# Patient Record
Sex: Male | Born: 1950 | Race: White | Hispanic: No | Smoking: Former smoker
Health system: Southern US, Community
[De-identification: ages and names within clinical notes are randomized; demographics above are authoritative.]

## PROBLEM LIST (undated history)

## (undated) DIAGNOSIS — M545 Low back pain, unspecified: Secondary | ICD-10-CM

## (undated) DIAGNOSIS — R011 Cardiac murmur, unspecified: Secondary | ICD-10-CM

## (undated) DIAGNOSIS — S81819A Laceration without foreign body, unspecified lower leg, initial encounter: Secondary | ICD-10-CM

## (undated) DIAGNOSIS — K219 Gastro-esophageal reflux disease without esophagitis: Secondary | ICD-10-CM

## (undated) DIAGNOSIS — G4700 Insomnia, unspecified: Secondary | ICD-10-CM

## (undated) DIAGNOSIS — I2510 Atherosclerotic heart disease of native coronary artery without angina pectoris: Secondary | ICD-10-CM

## (undated) DIAGNOSIS — M65949 Unspecified synovitis and tenosynovitis, unspecified hand: Secondary | ICD-10-CM

## (undated) DIAGNOSIS — N400 Enlarged prostate without lower urinary tract symptoms: Secondary | ICD-10-CM

## (undated) DIAGNOSIS — J189 Pneumonia, unspecified organism: Secondary | ICD-10-CM

## (undated) DIAGNOSIS — G5600 Carpal tunnel syndrome, unspecified upper limb: Secondary | ICD-10-CM

## (undated) DIAGNOSIS — E7800 Pure hypercholesterolemia, unspecified: Secondary | ICD-10-CM

## (undated) DIAGNOSIS — G4733 Obstructive sleep apnea (adult) (pediatric): Secondary | ICD-10-CM

## (undated) DIAGNOSIS — M659 Synovitis and tenosynovitis, unspecified: Secondary | ICD-10-CM

## (undated) DIAGNOSIS — M1990 Unspecified osteoarthritis, unspecified site: Secondary | ICD-10-CM

## (undated) HISTORY — DX: Synovitis and tenosynovitis, unspecified: M65.9

## (undated) HISTORY — DX: Unspecified synovitis and tenosynovitis, unspecified hand: M65.949

## (undated) HISTORY — DX: Gastro-esophageal reflux disease without esophagitis: K21.9

## (undated) HISTORY — DX: Low back pain, unspecified: M54.50

## (undated) HISTORY — DX: Pure hypercholesterolemia, unspecified: E78.00

## (undated) HISTORY — DX: Atherosclerotic heart disease of native coronary artery without angina pectoris: I25.10

## (undated) HISTORY — PX: CORONARY ANGIOPLASTY: SHX604

## (undated) HISTORY — DX: Low back pain: M54.5

## (undated) HISTORY — DX: Insomnia, unspecified: G47.00

## (undated) HISTORY — DX: Laceration without foreign body, unspecified lower leg, initial encounter: S81.819A

## (undated) HISTORY — PX: ROTATOR CUFF REPAIR: SHX139

## (undated) HISTORY — DX: Obstructive sleep apnea (adult) (pediatric): G47.33

## (undated) HISTORY — DX: Benign prostatic hyperplasia without lower urinary tract symptoms: N40.0

## (undated) HISTORY — DX: Carpal tunnel syndrome, unspecified upper limb: G56.00

---

## 2003-09-29 ENCOUNTER — Encounter: Admission: RE | Admit: 2003-09-29 | Discharge: 2003-12-07 | Payer: Self-pay | Admitting: Neurosurgery

## 2006-06-13 ENCOUNTER — Inpatient Hospital Stay (HOSPITAL_COMMUNITY): Admission: RE | Admit: 2006-06-13 | Discharge: 2006-06-14 | Payer: Self-pay | Admitting: Cardiology

## 2006-06-19 ENCOUNTER — Encounter (HOSPITAL_COMMUNITY): Admission: RE | Admit: 2006-06-19 | Discharge: 2006-09-17 | Payer: Self-pay | Admitting: Cardiology

## 2006-09-18 ENCOUNTER — Encounter (HOSPITAL_COMMUNITY): Admission: RE | Admit: 2006-09-18 | Discharge: 2006-10-06 | Payer: Self-pay | Admitting: Cardiology

## 2007-03-19 ENCOUNTER — Ambulatory Visit (HOSPITAL_COMMUNITY): Admission: RE | Admit: 2007-03-19 | Discharge: 2007-03-19 | Payer: Self-pay | Admitting: Cardiology

## 2007-05-02 ENCOUNTER — Emergency Department (HOSPITAL_COMMUNITY): Admission: EM | Admit: 2007-05-02 | Discharge: 2007-05-02 | Payer: Self-pay | Admitting: Emergency Medicine

## 2010-08-14 NOTE — Cardiovascular Report (Signed)
NAME:  Corey Duran, Corey Duran               ACCOUNT NO.:  [id number]   MEDICAL RECORD NO.:  [id number]          PATIENT TYPE:  OIB   LOCATION:  2852                         FACILITY:  [facility]   PHYSICIAN:  Francisca December, M.D.  DATE OF BIRTH:  [birth year]   DATE OF PROCEDURE:  03/19/2007  DATE OF DISCHARGE:  03/19/2007                            CARDIAC CATHETERIZATION   PROCEDURES PERFORMED:  1. Left heart catheterization.  2. Left ventriculogram.  3. Coronary angiography.   INDICATION:  Mr. Jeremaih Klima is a 60 year old man who is now 9 months  S/P a Xience/Promus drug-eluting stent placed in the left anterior  descending artery for progressive angina and high-grade obstruction.  He  has done well in the subsequent 9 months without any recurrent chest  discomfort.  He underwent a myocardial perfusion study with exercise  approximately 2 weeks ago, which showed normal LV systolic function,  excellent exercise tolerance without angina and normal perfusion.  He is  brought to the catheterization laboratory at this time to confirm the  absence of any in-stent restenosis and also to evaluate for any  progressive disease elsewhere.  He did have a 50% stenosis that was  tubular in the proximal portion of the left circumflex.   PROCEDURE NOTE:  The patient was brought to the cardiac catheterization  laboratory in a fasting state.  The right groin was prepped and draped  in the usual sterile fashion.  Local anesthesia was obtained with the  infiltration of 1% lidocaine.  A 6-French catheter sheath was inserted  percutaneously into the right femoral artery utilizing an anterior  approach over guiding J-wire.  Left heart catheterization and coronary  angiography then proceeded in the standard fashion using 6-French #4  left and right Judkins catheters and a 110-cm pigtail catheter.  A 30-  degree RAO cine left ventriculogram was performed utilizing a power  injector, and 45 mL were injected at 13 mL  per second.  The patient did  receive sublingual nitroglycerin 0.4 mg prior to the initiation of  coronary angiography.  At the completion of the procedure, a right  femoral arteriogram with a 45-degree RAO angulation via the catheter  sheath by hand injection documented adequate anatomy for placement of  the percutaneous closure device Angioseal.  This was subsequently  deployed with good hemostasis and an intact distal pulse.   HEMODYNAMICS:  Systemic arterial pressure was 113/63 with a mean of 118  mmHg.  There was no systolic gradient across the aortic valve.  The left  ventricular end-diastolic pressure was 14 mmHg pre-ventriculogram.   ANGIOGRAPHY:  The left ventriculogram demonstrated normal chamber size  and normal global systolic function without regional wall motion  abnormality.  A visual estimate of the ejection fraction is 60% to 75%.  There is no mitral regurgitation.  There is proximal left coronary  calcification seen.  The aortic valve is trileaflet and opens normally  during systole.   There is a right-dominant coronary system present.  The main left  coronary artery is short and normal.   The left anterior descending artery and its branches are  without  significant obstruction.  There is a 40% tubular narrowing from the  ostium to the origin of the stented segment.  The stent itself is widely  patent without any evidence of in-stent restenosis.  Just distal to the  stented segment 2 diagonal branches arise.  The first is small.  The  second is moderate in size, and there is a 70% narrowing at the origin  of the second diagonal branch.  The ongoing anterior descending artery  is without obstruction and reaches as well as traverses the apex.   The left circumflex coronary and its branches are moderately diseased;  the proximal portion from the ostium and extending about 8 to 10 mm  contains a tubular 50% to 60% stenosis.  Previously I had felt this  obstruction to  be 50%.  I cannot say that it significantly progressed at  all.  The circumflex then gives rise to a small first marginal branch, a  moderate-sized second marginal branch, a small third marginal branch and  then a large true obtuse marginal branch.  There are no obstructions in  these distal vessels.  There are luminal irregularities in the main  trunk of the circumflex coronary.   The right coronary artery and its branches are widely patent.  The  vessel is diseased and there are luminal irregularities throughout its  course.  No significant obstruction is seen.  Distally it bifurcates  into a moderate-sized posterior descending artery and a moderate-sized  posterolateral segment that gives rise to 2 small left ventricular  branches.  The posterior descending artery and posterolateral segment  and branches contain no obstructions as well.   Collateral vessels are not seen.   FINAL IMPRESSION:  1. Atherosclerotic coronary vascular disease, two-vessel.  2. Widely patent stented segment in the proximal anterior descending      artery, drug-eluting.  3. Intact left ventricular size and global systolic function, ejection      fraction 65%.  4. No progression of left circumflex coronary disease since March      2008.      Francisca December, M.D.  Electronically Signed     JHE/MEDQ  D:  03/19/2007  T:  03/20/2007  Job:  [id number]   cc:   Theressa Millard, M.D.  [facility]

## 2010-08-17 NOTE — Cardiovascular Report (Signed)
NAME:  Maulding, Egidio               ACCOUNT NO.:  [id number]   MEDICAL RECORD NO.:  [id number]          PATIENT TYPE:  OIB   LOCATION:  2899                         FACILITY:  [facility]   PHYSICIAN:  Francisca December, M.D.  DATE OF BIRTH:  06/20/1950   DATE OF PROCEDURE:  06/13/2006  DATE OF DISCHARGE:                            CARDIAC CATHETERIZATION   PROCEDURES PERFORMED:  1. Left heart catheterization.  2. Coronary angiography.  3. Left ventriculogram.  4. PCI/drug-eluting stent implantation proximal anterior descending      artery.   INDICATIONS:  Mr. Jaxton Casale is a 60 year old man who has developed a  rather typical anginal syndrome.  It had been somewhat progressive.  He  underwent a myocardial perfusion study in the office which was  electrocardiographically and clinically positive.  As well, there was a  large perfusion deficit extending from the base to the apex in the  anterior wall and anteroseptum.  He is brought now to the  catheterization laboratory to identify the extent of disease and provide  for further therapeutic options.   PROCEDURE NOTE:  The patient is brought to the cardiac catheterization  laboratory in the fasting state.  The right groin was prepped and draped  in the usual sterile fashion.  Local anesthesia was obtained with  infiltration of 1% lidocaine.  A 5-French catheter sheath was inserted  percutaneously into the right femoral artery utilizing an anterior  approach over a guiding J-wire.  Left heart catheterization and coronary  angiography, then proceeded in a standard fashion using 5-French #4 left  and right Judkins catheters and a 110-cm pigtail catheter.  A 30 degrees  RAO cine left ventriculogram was performed utilizing a power injector.  Following the completion of the diagnostic portion of the  catheterization, I proceeded with percutaneous intervention.   The patient received 4200 units of heparin intravenously as well as a  double  bolus and constant infusion Integrilin.  The resultant ACT was  275 seconds.  The 5-French catheter sheath was and upsized to a 6-French  catheter sheath over a long guiding J-wire.  A 6-French #3.5 ACLS  guiding catheter was advanced the ascending aorta where the left  coronary os was engaged.  A 0.014 inch Luge intracoronary guidewire was  passed across the lesion without difficulty.  Initial balloon dilatation  was performed with a 3/20 mm Maverick across the lesion in the very  proximal portion of the anterior descending artery.  It was inflated to  4 atmospheres for not greater than 45 seconds.  This balloon was  deflated and removed and a 3.5/16 mm drug-eluting stent (PERSEUS study)  was advanced into place, carefully positioned and inflated to peak  pressure of 15 atmospheres for not greater than 45 seconds.  This  balloon was deflated and removed and following confirmation of adequate  patency in orthogonal views both with and without the guidewire in  place, the guiding catheter was removed.  A right femoral arteriogram in  the 45 degree RAO angulation documented adequate anatomy for placement  of percutaneous closure device Angio-Seal.  This was subsequent deployed  with good hemostasis and an intact distal pulse.   HEMODYNAMICS:  The systemic arterial pressure was 95/55 with mean of 72  mmHg.  There is no systolic gradient across the aortic valve.  Left  ventricular end-diastolic pressure was 13 mmHg preventriculogram.   Angiography:  The left ventriculogram demonstrated normal chamber size  and normal global systolic function without regional wall motion  abnormality.  A visual estimate of the ejection fraction is 65%.  There  is no significant mitral regurgitation.  I cannot identify coronary  calcification.  The aortic valve is trileaflet and opens normally during  systole.   There is a right-dominant coronary system present.  The main left  coronary artery is short and  normal.   The left anterior descending artery and its branches are highly  diseased; there is any eccentric tubular stenosis in the proximal  segment.  This is prior to the origin of two diagonal branches, one of  which is small and the second of which is moderate in size.  The ongoing  anterior descending artery then reaches and barely traverses the apex.   The left circumflex coronary and its branches are moderately diseased;  there is a diffuse proximal stenosis in the range of 50%.  It extends  from the origin of the left circumflex to the origin of the second  marginal branch which is moderate in size.  Beyond this, the left  circumflex and widens significantly and gives rise to a third small  marginal and then a very large fourth or obtuse marginal.  There are  luminal irregularities in the left circumflex and obtuse marginal beyond  the second marginal branch but no significant obstructions.   The right coronary and its branches are without significant obstruction.  Again, there are luminal irregularities and there is a 20-30% stenosis  in the distal segment.  The distal vessel gives rise to a large  posterior descending artery and a fairly large posterolateral segment  with three small left ventricular branches.  There are no significant  obstructions in the distal portion of the right coronary or in its  distal branches.   Following balloon dilatation and stent implantation in the proximal  anterior descending artery, there was no residual stenosis and in fact,  there is a good step-up and step-down at the proximal and distal end  of the stent, respectively.   FINAL IMPRESSION:  1. Atherosclerotic cardiovascular disease, two-vessel.  2. Intact left ventricular size and global systolic function, ejection      fraction 65%.  3. Status post successful percutaneous coronary intervention/drug-     eluting stent implantation proximal anterior descending artery.      Francisca December, M.D.  Electronically Signed     JHE/MEDQ  D:  06/13/2006  T:  06/15/2006  Job:  [id number]   cc:   Theressa Millard, M.D.

## 2013-04-05 ENCOUNTER — Telehealth: Payer: Self-pay | Admitting: Interventional Cardiology

## 2013-04-05 NOTE — Telephone Encounter (Signed)
Received request from Nurse fax box, documents faxed for surgical clearance. To: Maudie Flakes Fax number: [phone] Attention: 04/05/13/km

## 2013-11-02 ENCOUNTER — Encounter: Payer: Self-pay | Admitting: Cardiology

## 2013-11-02 DIAGNOSIS — I2510 Atherosclerotic heart disease of native coronary artery without angina pectoris: Secondary | ICD-10-CM | POA: Insufficient documentation

## 2013-11-02 DIAGNOSIS — E7800 Pure hypercholesterolemia, unspecified: Secondary | ICD-10-CM | POA: Insufficient documentation

## 2013-11-05 ENCOUNTER — Ambulatory Visit: Payer: Self-pay | Admitting: Interventional Cardiology

## 2013-12-22 ENCOUNTER — Ambulatory Visit (INDEPENDENT_AMBULATORY_CARE_PROVIDER_SITE_OTHER): Payer: BC Managed Care – PPO | Admitting: Interventional Cardiology

## 2013-12-22 ENCOUNTER — Encounter: Payer: Self-pay | Admitting: Interventional Cardiology

## 2013-12-22 VITALS — BP 120/78 | HR 52 | Ht 66.0 in | Wt 192.8 lb

## 2013-12-22 DIAGNOSIS — E7800 Pure hypercholesterolemia, unspecified: Secondary | ICD-10-CM

## 2013-12-22 DIAGNOSIS — I10 Essential (primary) hypertension: Secondary | ICD-10-CM

## 2013-12-22 DIAGNOSIS — G4733 Obstructive sleep apnea (adult) (pediatric): Secondary | ICD-10-CM

## 2013-12-22 DIAGNOSIS — I2510 Atherosclerotic heart disease of native coronary artery without angina pectoris: Secondary | ICD-10-CM

## 2013-12-22 MED ORDER — EZETIMIBE-SIMVASTATIN 10-80 MG PO TABS: 0.5000 | ORAL_TABLET | Freq: Every day | ORAL | Status: DC

## 2013-12-22 NOTE — Progress Notes (Signed)
Patient ID: Corey Duran, male   DOB: 11/17/1950, 63 y.o.   MRN: [id number]    [address], [place], [place]  [address] Phone: [phone] Fax:  [phone]  Date:  12/22/2013   ID:  Corey Duran, DOB [birth year], MRN [id number]  PCP:  Horton Finer, MD      History of Present Illness: Corey Duran is a 63 y.o. male  who has CAD, LAD stent in 2008. He had been on CPAP and had some improvement in his fatigue. He has stopped his CPAP of late due to frustration. He had GERD is still.  Not asociated with exercise. Resolved with ranitidine. He does some strenuous work around the house. No chest pain with this. No SOB. CAD/ASCVD:  Denies : Chest pain.  Dizziness.  Leg edema.  Nitroglycerin.  Palpitations.  Syncope.  Not exercising regularly.   Feels he did sleep better with CPAP.       Wt Readings from Last 3 Encounters:  12/22/13 192 lb 12.8 oz (87.454 kg)     Past Medical History  Diagnosis Date  . Hypercholesteremia   . GERD (gastroesophageal reflux disease)   . CTS (carpal tunnel syndrome)     1993, 2002  . Insomnia   . LBP (low back pain)   . Coronary atherosclerosis of native coronary artery     05/2006, stent LAD, patent 03/2007, study stent  . BPH (benign prostatic hypertrophy)     followed by Dr. Reece Agar in the past; Now Dr. Diona Fanti  . Laceration of leg     severe, 2009  . OSA (obstructive sleep apnea)     PSG 07/23/10 ESS 11, AHI 16/hr REM 18/hr, RDI 17/hr REM 20/hr, O2 min 87%; CPAP 08/22/10 CPAP 10 with AHI 0  . Tenosynovitis of finger     left third finger    Current Outpatient Prescriptions  Medication Sig Dispense Refill  . aspirin 81 MG tablet Take 81 mg by mouth daily.      . clopidogrel (PLAVIX) 75 MG tablet Take 75 mg by mouth daily.      . Coenzyme Q10 (COQ10) 100 MG CAPS Take 100 mg by mouth daily.      Marland Kitchen ezetimibe-simvastatin (VYTORIN) 10-80 MG per tablet Take 1 tablet by mouth daily.      . finasteride (PROSCAR)  5 MG tablet Take 5 mg by mouth daily.      . metoprolol tartrate (LOPRESSOR) 25 MG tablet Take 25 mg by mouth 2 (two) times daily.      . Multiple Vitamins-Minerals (MULTIVITAMIN PO) Take 1 tablet by mouth daily.      . nitroGLYCERIN (NITROSTAT) 0.4 MG SL tablet Place 0.4 mg under the tongue every 5 (five) minutes as needed for chest pain.      . Omega-3 Fatty Acids (FISH OIL) 1200 MG CAPS Take 1,200 mg by mouth 2 (two) times daily.      . ranitidine (ZANTAC) 75 MG tablet Take 75 mg by mouth 2 (two) times daily.      . tamsulosin (FLOMAX) 0.4 MG CAPS capsule Take 0.4 mg by mouth daily.       No current facility-administered medications for this visit.    Allergies:   No Known Allergies  Social History:  The patient  reports that he has quit smoking. He does not have any smokeless tobacco history on file. He reports that he drinks alcohol.   Family History:  The patient's family history includes  CAD in his father; Dementia in his mother; Hypertension in his father; Ulcers in his father.   ROS:  Please see the history of present illness.  No nausea, vomiting.  No fevers, chills.  No focal weakness.  No dysuria.    All other systems reviewed and negative.   PHYSICAL EXAM: VS:  BP 120/78  Pulse 52  Ht 5\' 6"  (1.676 m)  Wt 192 lb 12.8 oz (87.454 kg)  BMI 31.13 kg/m2 Well nourished, well developed, in no acute distress HEENT: normal Neck: no JVD, no carotid bruits Cardiac:  normal S1, S2; RRR;  Lungs:  clear to auscultation bilaterally, no wheezing, rhonchi or rales Abd: soft, nontender, no hepatomegaly Ext: no edema Skin: warm and dry Neuro:   no focal abnormalities noted  EKG:  Sinus bradycardia; otherwise normal ECG     ASSESSMENT AND PLAN:  Coronary atherosclerosis of native coronary artery  Continue Plavix Tablet, 75 MG, 1 tablet, Orally, Once a day  Stop Aspirin Tablet, 81 MG, 1 tablet, Orally, Once a day Started Nitroglycerin 0.4 mg tablet, 0.4 mg, 1 tablet as directed, SL,  as directed prn chest pain, 1 vial, Refills 6 IMAGING: EKG    Stegall,Amy 11/06/2012 11:40:09 AM > VARANASI,JAY 11/06/2012 12:26:10 PM > sinus bradycardia, no significant ST changes   Notes: No real angina. He had a negative stress test in 2012. Off of CPAP now.  Hiked [place] recently without problems. LDL 80 in 3/14. Not using   2. Pure hypercholesterolemia  Decrease Vytorin Tablet, 10-80 MG, TAKE half TABLET BY MOUTH ONCE A DAY Continue Fish Oil Capsule, 1200 MG, 1 tab, Orally, bid Notes: LDL 80 at last check in 3/14. In 2015: 297 TG; LDL 38.  Decrease Vytorin.  If cholesterol stays low, could switch to lipitor 80 mg daily.   Check lipids in 3 months. Take fish oil daily.    3. Essential hypertension, benign  Continue Metoprolol Tartrate Tablet, 25 MG, as directed, Orally, bid Notes: Controlled. Doxazosin was stopped for a while by Dr. Maxwell Caul. He thinks this may be why diastolic BP is a little high. If additional BP meds needed, would consder ACE-I given CAD.    4. Obstructive sleep apnea  Notes: Resume CPAP. This should help with fatigue.  F/u with Dr. Maxwell Caul.    Preventive Medicine  Adult topics discussed:  Diet: healthy diet, low calorie, low fat.  Exercise: 5 days a week, at least 30 minutes of aerobic exercise.      Signed, Mina Marble, MD, [facility] 12/22/2013 4:26 PM

## 2013-12-22 NOTE — Patient Instructions (Addendum)
Your physician has recommended you make the following change in your medication:   1. Stop Aspirin.   2. Decrease Vytorin 10-80 to 1/2 tablet daily.   Your physician recommends that you return for a FASTING lipid and heaptic on 03/23/14.   Your physician wants you to follow-up in: 1 year with Dr. Irish Lack. You will receive a reminder letter in the mail two months in advance. If you don't receive a letter, please call our office to schedule the follow-up appointment.

## 2014-03-23 ENCOUNTER — Other Ambulatory Visit: Payer: Self-pay

## 2014-03-29 ENCOUNTER — Other Ambulatory Visit (INDEPENDENT_AMBULATORY_CARE_PROVIDER_SITE_OTHER): Payer: Medicare Other | Admitting: *Deleted

## 2014-03-29 DIAGNOSIS — E7800 Pure hypercholesterolemia, unspecified: Secondary | ICD-10-CM

## 2014-03-29 LAB — HEPATIC FUNCTION PANEL
ALT: 24 U/L (ref 0–53)
AST: 18 U/L (ref 0–37)
Albumin: 4.1 g/dL (ref 3.5–5.2)
Alkaline Phosphatase: 53 U/L (ref 39–117)
Bilirubin, Direct: 0 mg/dL (ref 0.0–0.3)
Total Bilirubin: 0.8 mg/dL (ref 0.2–1.2)
Total Protein: 6.9 g/dL (ref 6.0–8.3)

## 2014-03-29 LAB — LDL CHOLESTEROL, DIRECT: LDL DIRECT: 70.4 mg/dL

## 2014-03-29 LAB — LIPID PANEL
CHOL/HDL RATIO: 4
Cholesterol: 141 mg/dL (ref 0–200)
HDL: 34.2 mg/dL — AB (ref >39.00)
NonHDL: 106.8
Triglycerides: 216 mg/dL — ABNORMAL HIGH (ref 0.0–149.0)
VLDL: 43.2 mg/dL — AB (ref 0.0–40.0)

## 2014-07-26 ENCOUNTER — Other Ambulatory Visit: Payer: Self-pay | Admitting: Internal Medicine

## 2014-07-26 DIAGNOSIS — Z139 Encounter for screening, unspecified: Secondary | ICD-10-CM

## 2014-07-27 ENCOUNTER — Ambulatory Visit
Admission: RE | Admit: 2014-07-27 | Discharge: 2014-07-27 | Disposition: A | Discharge status: Home / Self Care | Payer: Self-pay | Source: Ambulatory Visit | Attending: Internal Medicine | Admitting: Internal Medicine

## 2014-07-27 DIAGNOSIS — Z139 Encounter for screening, unspecified: Secondary | ICD-10-CM

## 2015-01-23 ENCOUNTER — Ambulatory Visit (INDEPENDENT_AMBULATORY_CARE_PROVIDER_SITE_OTHER): Payer: Medicare Other | Admitting: Interventional Cardiology

## 2015-01-23 ENCOUNTER — Encounter: Payer: Self-pay | Admitting: Interventional Cardiology

## 2015-01-23 VITALS — BP 110/75 | HR 51 | Ht 66.0 in | Wt 182.0 lb

## 2015-01-23 DIAGNOSIS — I2510 Atherosclerotic heart disease of native coronary artery without angina pectoris: Secondary | ICD-10-CM | POA: Diagnosis not present

## 2015-01-23 DIAGNOSIS — R001 Bradycardia, unspecified: Secondary | ICD-10-CM | POA: Insufficient documentation

## 2015-01-23 DIAGNOSIS — I10 Essential (primary) hypertension: Secondary | ICD-10-CM

## 2015-01-23 DIAGNOSIS — G4733 Obstructive sleep apnea (adult) (pediatric): Secondary | ICD-10-CM

## 2015-01-23 DIAGNOSIS — E7800 Pure hypercholesterolemia, unspecified: Secondary | ICD-10-CM

## 2015-01-23 NOTE — Patient Instructions (Signed)
Medication Instructions:  Same-no changes  Labwork: Lipids and CMET tomorrow. Please do not eat or drink after midnight tonight.  Testing/Procedures: None  Follow-Up: Your physician wants you to follow-up in: 1 year. You will receive a reminder letter in the mail two months in advance. If you don't receive a letter, please call our office to schedule the follow-up appointment.        If you need a refill on your cardiac medications before your next appointment, please call your pharmacy.

## 2015-01-23 NOTE — Progress Notes (Signed)
Patient ID: Corey Duran, male   DOB: 04-Dec-1950, 64 y.o.   MRN: [id number]     Cardiology Office Note   Date:  01/23/2015   ID:  LIBAN GUEDES, DOB [birth year], MRN [id number]  PCP:  Horton Finer, MD    No chief complaint on file. CAD   Wt Readings from Last 3 Encounters:  01/23/15 182 lb (82.555 kg)  12/22/13 192 lb 12.8 oz (87.454 kg)       History of Present Illness: Corey Duran is a 64 y.o. male who has had CAD.  He had an LAD stent in 2008.  He had been on CAP in the past but stopped it due to the inconvenience.  He sleeps better with the CPAP, but it was not worth the hassle.  He plays golf, dances for exercise.  No chest discomfort.    No shortness of breath with activity.  No sx like what he had with stent.  (Sweating, feeling cold)  Aspirin was stopped in the past.    Vytorin was decreased to half tab daily and LDL was still 70.    He has lost some weight recently through diet control.         Past Medical History  Diagnosis Date  . Hypercholesteremia   . GERD (gastroesophageal reflux disease)   . CTS (carpal tunnel syndrome)     1993, 2002  . Insomnia   . LBP (low back pain)   . Coronary atherosclerosis of native coronary artery     05/2006, stent LAD, patent 03/2007, study stent  . BPH (benign prostatic hypertrophy)     followed by Dr. Reece Agar in the past; Now Dr. Diona Fanti  . Laceration of leg     severe, 2009  . OSA (obstructive sleep apnea)     PSG 07/23/10 ESS 11, AHI 16/hr REM 18/hr, RDI 17/hr REM 20/hr, O2 min 87%; CPAP 08/22/10 CPAP 10 with AHI 0  . Tenosynovitis of finger     left third finger    History reviewed. No pertinent past surgical history.   Current Outpatient Prescriptions  Medication Sig Dispense Refill  . clopidogrel (PLAVIX) 75 MG tablet Take 75 mg by mouth daily.    . Coenzyme Q10 (COQ10) 100 MG CAPS Take 100 mg by mouth daily.    Marland Kitchen ezetimibe-simvastatin (VYTORIN) 10-80 MG per tablet Take 0.5 tablets by  mouth daily.    . finasteride (PROSCAR) 5 MG tablet Take 5 mg by mouth daily.    . metoprolol tartrate (LOPRESSOR) 25 MG tablet Take 25 mg by mouth 2 (two) times daily.    . Multiple Vitamins-Minerals (MULTIVITAMIN PO) Take 1 tablet by mouth daily.    . nitroGLYCERIN (NITROSTAT) 0.4 MG SL tablet Place 0.4 mg under the tongue every 5 (five) minutes as needed for chest pain.    . Omega-3 Fatty Acids (FISH OIL) 1200 MG CAPS Take 1,200 mg by mouth 2 (two) times daily.    . ranitidine (ZANTAC) 75 MG tablet Take 75 mg by mouth as needed (HEARTBURN).     . tamsulosin (FLOMAX) 0.4 MG CAPS capsule Take 0.4 mg by mouth daily.     No current facility-administered medications for this visit.    Allergies:   Review of patient's allergies indicates no known allergies.    Social History:  The patient  reports that he has quit smoking. He does not have any smokeless tobacco history on file. He reports that he drinks alcohol.   Family History:  The patient's family history includes CAD in his father; Dementia in his mother; Heart attack in his father; Hypertension in his father; Ulcers in his father. There is no history of Stroke.    ROS:  Please see the history of present illness.   Otherwise, review of systems are positive for intentional weight loss.   All other systems are reviewed and negative.    PHYSICAL EXAM: VS:  BP 110/75 mmHg  Pulse 51  Ht 5\' 6"  (1.676 m)  Wt 182 lb (82.555 kg)  BMI 29.39 kg/m2 , BMI Body mass index is 29.39 kg/(m^2). GEN: Well nourished, well developed, in no acute distress HEENT: normal Neck: no JVD, carotid bruits, or masses Cardiac: RRR; no murmurs, rubs, or gallops,no edema  Respiratory:  clear to auscultation bilaterally, normal work of breathing GI: soft, nontender, nondistended, + BS MS: no deformity or atrophy Skin: warm and dry, no rash Neuro:  Strength and sensation are intact Psych: euthymic mood, full affect   EKG:   The ekg ordered today demonstrates  normal    Recent Labs: 03/29/2014: ALT 24   Lipid Panel    Component Value Date/Time   CHOL 141 03/29/2014 1141   TRIG 216.0* 03/29/2014 1141   HDL 34.20* 03/29/2014 1141   CHOLHDL 4 03/29/2014 1141   VLDL 43.2* 03/29/2014 1141   LDLDIRECT 70.4 03/29/2014 1141     Other studies Reviewed: Additional studies/ records that were reviewed today with results demonstrating: LAD stent placed in 2008 by Dr. Leonia Reeves.   ASSESSMENT AND PLAN:  1. CAD: Had unstable angina type sx in 2008 without MI at time of his stent placement.    2. Hyperlipidemia: Continue current dose of Vytorin. LDL 70. His LDL may have decreased with his lifestyle changes. Will recheck labs. If his LDL is sniffling below 70, could decrease current dose of Vytorin. 3. Hypertension: controlled.continue current medicines. 4. Bradycardia: Unchanged.  No symptoms of bradycardia. 5. Sleep apnea: He has stopped using the C Pap on his own.  I offered for him to see Dr. Claiborne Billings or Dr. Radford Pax. He would be able to discuss with them more convenient see Pap systems. At this point, he will find a new primary care doctor and he can follow-up with them regarding the sleep apnea.   Current medicines are reviewed at length with the patient today.  The patient concerns regarding his medicines were addressed.  The following changes have been made:  No change  Labs/ tests ordered today include:  No orders of the defined types were placed in this encounter.    Recommend 150 minutes/week of aerobic exercise Low fat, low carb, high fiber diet recommended  Disposition:   FU in 1 year   Teresita Madura., MD  01/23/2015 4:20 PM    [facility] [address], [place], [place]  [address] Phone: [phone]; Fax: [phone]

## 2015-01-24 ENCOUNTER — Other Ambulatory Visit (INDEPENDENT_AMBULATORY_CARE_PROVIDER_SITE_OTHER): Payer: Medicare Other | Admitting: *Deleted

## 2015-01-24 DIAGNOSIS — E7800 Pure hypercholesterolemia, unspecified: Secondary | ICD-10-CM

## 2015-01-24 LAB — COMPREHENSIVE METABOLIC PANEL
ALK PHOS: 49 U/L (ref 40–115)
ALT: 20 U/L (ref 9–46)
AST: 18 U/L (ref 10–35)
Albumin: 3.8 g/dL (ref 3.6–5.1)
BUN: 18 mg/dL (ref 7–25)
CO2: 27 mmol/L (ref 20–31)
CREATININE: 0.98 mg/dL (ref 0.70–1.25)
Calcium: 9 mg/dL (ref 8.6–10.3)
Chloride: 104 mmol/L (ref 98–110)
GLUCOSE: 94 mg/dL (ref 65–99)
Potassium: 4 mmol/L (ref 3.5–5.3)
Sodium: 137 mmol/L (ref 135–146)
Total Bilirubin: 0.7 mg/dL (ref 0.2–1.2)
Total Protein: 6.6 g/dL (ref 6.1–8.1)

## 2015-01-24 LAB — LIPID PANEL
Cholesterol: 136 mg/dL (ref 125–200)
HDL: 32 mg/dL — ABNORMAL LOW (ref >=40)
LDL Cholesterol: 72 mg/dL (ref <130)
Total CHOL/HDL Ratio: 4.3 Ratio (ref <=5.0)
Triglycerides: 158 mg/dL — ABNORMAL HIGH (ref <150)
VLDL: 32 mg/dL — ABNORMAL HIGH (ref <30)

## 2015-01-26 ENCOUNTER — Telehealth: Payer: Self-pay | Admitting: Interventional Cardiology

## 2015-01-26 NOTE — Telephone Encounter (Signed)
Follow up;    Pt return our call needs a call back on cell phone for results.

## 2015-02-14 ENCOUNTER — Telehealth: Payer: Self-pay | Admitting: Interventional Cardiology

## 2015-02-14 NOTE — Telephone Encounter (Signed)
**Note De-identified Katherinne Mofield Obfuscation** The pt is advised of his lab results and he verbalized understanding. 

## 2015-02-14 NOTE — Telephone Encounter (Signed)
New message  ° ° °Patient calling for test results.   °

## 2015-02-14 NOTE — Telephone Encounter (Signed)
**Note De-identified Corey Duran Obfuscation** LMTCB

## 2015-04-07 ENCOUNTER — Encounter: Payer: Self-pay | Admitting: Family Medicine

## 2015-04-07 ENCOUNTER — Ambulatory Visit (INDEPENDENT_AMBULATORY_CARE_PROVIDER_SITE_OTHER): Payer: Medicare Other | Admitting: Family Medicine

## 2015-04-07 ENCOUNTER — Ambulatory Visit (INDEPENDENT_AMBULATORY_CARE_PROVIDER_SITE_OTHER): Payer: Medicare Other

## 2015-04-07 VITALS — BP 140/74 | HR 53 | Temp 97.3°F | Ht 66.0 in | Wt 180.6 lb

## 2015-04-07 DIAGNOSIS — M5442 Lumbago with sciatica, left side: Secondary | ICD-10-CM | POA: Diagnosis not present

## 2015-04-07 DIAGNOSIS — Z23 Encounter for immunization: Secondary | ICD-10-CM | POA: Diagnosis not present

## 2015-04-07 DIAGNOSIS — M545 Low back pain, unspecified: Secondary | ICD-10-CM | POA: Insufficient documentation

## 2015-04-07 MED ORDER — GABAPENTIN 100 MG PO CAPS: 100.0000 mg | ORAL_CAPSULE | Freq: Three times a day (TID) | ORAL | Status: DC

## 2015-04-07 MED ORDER — PREDNISONE 20 MG PO TABS: 20.0000 mg | ORAL_TABLET | Freq: Every day | ORAL | Status: DC

## 2015-04-07 NOTE — Patient Instructions (Signed)
Great to meet you!  We will do a complete physical in April as scheduled.   We are pursuing the MRI  Start prednisone tomorrow morning  Start gabapentin tonight

## 2015-04-07 NOTE — Progress Notes (Signed)
HPI  Patient presents today to establish care and discuss back pain.  Patient explains that he's had difficulty with back pain for several years. Since August he's had pretty much constant pain with worsening exacerbations off and on.  He describes left-sided lower back pain that radiates down to his left. Is a sharp shooting pain. He also has left leg weakness and walks with a limp. At work it hurts worse with twisting, walking, and other activity. He has been working with a Geophysicist/field seismologist and a Restaurant manager, fast food for several months now. His chiropractor, who he seen for over 20 years, has recommended that he try prednisone course and pursue an MRI.  He has a history of coronary artery disease with a stent placed in 2008. He was previously on a CPAP but is not using anymore.  He denies any chest pain, dyspnea, palpitations.  He is tolerating all his medications well. He usually gets a physical every April.   PMH: Smoking status noted His past medical, surgical, social, family history reviewed and updated in EMR ROS: Per HPI  Objective: BP 140/74 mmHg  Pulse 53  Temp(Src) 97.3 F (36.3 C) (Oral)  Ht 5\' 6"  (1.676 m)  Wt 180 lb 9.6 oz (81.92 kg)  BMI 29.16 kg/m2 Gen: NAD, alert, cooperative with exam HEENT: NCAT, TMs normal bilaterally, oropharynx clear, nares clear CV: RRR, good S1/S2, no murmur Resp: CTABL, no wheezes, non-labored Abd: SNTND, BS present, no guarding or organomegaly Ext: No edema, warm Neuro: Alert and oriented, 2+ patellar tendon reflexes bilaterally, left lower extremity with 3-4/5 strength compared to 5/5 strength on the right, positive modified straight leg raise on both legs, left greater than right but both with left-sided symptoms. Limited range of motion due to pain twisting to the left, right, and flexing forward.   Low back film 04/07/2015 Preserved joint space except for L5/S1 where he is joint space narrowing and sclerosis, some mild sclerosis  otherwise No acute findings  Assessment and plan:  # Low back pain with sciatica With loss of strength of fingers appropriately go ahead and pursue MRI. Plain film today shows severe degenerative disc disease at as L5/S1 Prednisone, gabapentin Physical therapy order. Continue NSAIDs as needed  # CAD Status post stent placement, continue Plavix Continue beta blocker We'll plan to limit NSAIDs, Aleve is preferable  # BPH Helped by Proscar and Flomax   Plans physical in April this year    Orders Placed This Encounter  Procedures  . DG Lumbar Spine 2-3 Views    Standing Status: Future     Number of Occurrences:      Standing Expiration Date: 06/06/2016    Order Specific Question:  Reason for Exam (SYMPTOM  OR DIAGNOSIS REQUIRED)    Answer:  back pain with scaitica and leg weakness, L side    Order Specific Question:  Preferred imaging location?    Answer:  [facility]  . MR Lumbar Spine Wo Contrast    Standing Status: Future     Number of Occurrences:      Standing Expiration Date: 06/04/2016    Order Specific Question:  Reason for Exam (SYMPTOM  OR DIAGNOSIS REQUIRED)    Answer:  back pain with scaitica and leg weakness, L side    Order Specific Question:  Preferred imaging location?    Answer:  [facility]    Order Specific Question:  Does the patient have a pacemaker or implanted devices?    Answer:  No  Order Specific Question:  What is the patient's sedation requirement?    Answer:  No Sedation    Meds ordered this encounter  Medications  . predniSONE (DELTASONE) 20 MG tablet    Sig: Take 1 tablet (20 mg total) by mouth daily with breakfast.    Dispense:  14 tablet    Refill:  0  . gabapentin (NEURONTIN) 100 MG capsule    Sig: Take 1 capsule (100 mg total) by mouth 3 (three) times daily.    Dispense:  90 capsule    Refill:  [place], MD [facility] 04/07/2015, 3:32 PM

## 2015-04-11 ENCOUNTER — Telehealth: Payer: Self-pay | Admitting: Family Medicine

## 2015-04-11 DIAGNOSIS — M5442 Lumbago with sciatica, left side: Secondary | ICD-10-CM

## 2015-04-11 NOTE — Telephone Encounter (Signed)
Pt aware MRI went to clinical review and PT appointment made

## 2015-04-11 NOTE — Telephone Encounter (Signed)
mRI in review with insurance

## 2015-04-12 ENCOUNTER — Telehealth: Payer: Self-pay | Admitting: Family Medicine

## 2015-04-13 ENCOUNTER — Ambulatory Visit: Payer: Medicare Other | Attending: Family Medicine

## 2015-04-13 DIAGNOSIS — R531 Weakness: Secondary | ICD-10-CM

## 2015-04-13 DIAGNOSIS — M5417 Radiculopathy, lumbosacral region: Secondary | ICD-10-CM | POA: Insufficient documentation

## 2015-04-13 DIAGNOSIS — M5416 Radiculopathy, lumbar region: Secondary | ICD-10-CM

## 2015-04-13 DIAGNOSIS — R262 Difficulty in walking, not elsewhere classified: Secondary | ICD-10-CM | POA: Diagnosis present

## 2015-04-13 DIAGNOSIS — M5432 Sciatica, left side: Secondary | ICD-10-CM | POA: Diagnosis present

## 2015-04-13 MED ORDER — TRAMADOL HCL 50 MG PO TABS: 50.0000 mg | ORAL_TABLET | Freq: Three times a day (TID) | ORAL | Status: DC | PRN

## 2015-04-13 NOTE — Therapy (Signed)
[place], [place], [address] Phone: [phone]   Fax:  [phone]  Physical Therapy Evaluation  Patient Details  Name: BOHANNON COMPHER MRN: [id number] Date of Birth: 1950-11-21 Referring Provider: Wendi Snipes  Encounter Date: 04/13/2015      PT End of Session - 04/13/15 1819    Visit Number 1   Number of Visits 20   Date for PT Re-Evaluation 06/07/15   PT Start Time [id number]   PT Stop Time 1423   PT Time Calculation (min) 50 min   Activity Tolerance Patient limited by pain   Behavior During Therapy [facility] for tasks assessed/performed      Past Medical History  Diagnosis Date  . Hypercholesteremia   . GERD (gastroesophageal reflux disease)   . CTS (carpal tunnel syndrome)     1993, 2002  . Insomnia   . LBP (low back pain)   . Coronary atherosclerosis of native coronary artery     05/2006, stent LAD, patent 03/2007, study stent  . BPH (benign prostatic hypertrophy)     followed by Dr. Reece Agar in the past; Now Dr. Diona Fanti  . Laceration of leg     severe, 2009  . OSA (obstructive sleep apnea)     PSG 07/23/10 ESS 11, AHI 16/hr REM 18/hr, RDI 17/hr REM 20/hr, O2 min 87%; CPAP 08/22/10 CPAP 10 with AHI 0  . Tenosynovitis of finger     left third finger    No past surgical history on file.  There were no vitals filed for this visit.  Visit Diagnosis:  Lumbar back pain with radiculopathy affecting left lower extremity - Plan: PT plan of care cert/re-cert  Sciatica associated with disorder of lumbar spine, left - Plan: PT plan of care cert/re-cert  Weakness - Plan: PT plan of care cert/re-cert  Difficulty in walking - Plan: PT plan of care cert/re-cert      Subjective Assessment - 04/13/15 1338    Subjective Pt rpeorts LBP began 40- yrs ago after "moving a piano". Pt reports his back has always "bothered" him since. Pt sought tx with massage therapist and chiropractor with some relief over the  past few years. On Aug 4th, pt reports exacerbation of LBP after golf swing. Since then, back pain has worsned and 2 weeks ago, referral pain into L leg began and is severely limiting pt's abily to do any functional movements. Pt reports he has been using RW at home. Pt's goals are to dimishish pain and return to PLOF including golfing.    Pertinent History Hx of chronic back pain.    How long can you sit comfortably? 0 mins   How long can you stand comfortably? 0 mins   How long can you walk comfortably? 0 mins    Diagnostic tests MRI was denied - requiring PT first for 6 weeks.    Patient Stated Goals return to golf and pain- free   Currently in Pain? Yes   Pain Score 6    Pain Location Back   Pain Orientation Left;Lower;Mid   Pain Descriptors / Indicators Sharp;Shooting;Aching;Constant   Pain Type Neuropathic pain   Pain Radiating Towards L foot L4 dermatone    Pain Onset 1 to 4 weeks ago  2 weeks ago radiated to foot    Pain Frequency Constant   Aggravating Factors  worse in AM, worse late at night lying in bed. able to sit leaning to L away from pain  longer than stand  Pain Relieving Factors lying supine    Effect of Pain on Daily Activities unable to walk, sit, or perform ADLs without pain.             [facility] PT Assessment - 04/13/15 1802    Assessment   Medical Diagnosis LBP with L sciatica   Referring Provider Wendi Snipes   Onset Date/Surgical Date 03/29/15   Prior Therapy Chiropractor and massage therapist    Precautions   Precautions None   Balance Screen   Has the patient fallen in the past 6 months No   [place] residence   Prior Function   Level of Independence Independent   Observation/Other Assessments   Focus on Therapeutic Outcomes (FOTO)  64% limited    Posture/Postural Control   Posture/Postural Control Postural limitations   Postural Limitations Weight shift right   Posture Comments lateral shift R and flexed   AROM    Lumbar Flexion 50   Lumbar Extension 10   Lumbar - Right Side Bend 0   Lumbar - Left Side Bend 0   Lumbar - Right Rotation 0   Lumbar - Left Rotation 0   Strength   Overall Strength --  deferred strength testing due to pain   Palpation   Palpation comment mm guarding L paraspinals and gluteals   Special Tests    Special Tests --                   [facility] Adult PT Treatment/Exercise - 04/13/15 1802    Exercises   Exercises Lumbar   Lumbar Exercises: Prone   Other Prone Lumbar Exercises prone over 3 pillow 10 mins    Modalities   Modalities Cryotherapy   Cryotherapy   Number Minutes Cryotherapy 8 Minutes   Cryotherapy Location Lumbar Spine   Type of Cryotherapy Ice pack                PT Education - 04/13/15 1818    Education provided Yes   Education Details PT POC, positioning, suspected stenosis due to lateral disc bulge.   Person(s) Educated Patient   Methods Demonstration;Explanation;Verbal cues   Comprehension Verbalized understanding;Returned demonstration;Verbal cues required          PT Short Term Goals - 04/13/15 1829    PT SHORT TERM GOAL #1   Title Pt will be I with initial HEP for continued strengthening and mobility   ( 05/14/15)   Time 4   Period Weeks   Status New   PT SHORT TERM GOAL #2   Title Pain will decrease from 6-7/10 to 3-4/10 at rest by 05/14/15.   Time 4   Period Weeks   Status New           PT Long Term Goals - 04/13/15 1830    PT LONG TERM GOAL #1   Title Pt will be able to demo safe lifting, body mechanics, and understand posture as it pertains to his back by 06/07/15    Time 8   Period Weeks   Status New   PT LONG TERM GOAL #2   Title FOTO score will improve from 64% limited to <29% on FOTO to demo improved function and mobility by 06/07/15.   Time 8   Period Weeks   Status New   PT LONG TERM GOAL #3   Title Lumbar extension AROM will improve from 7 degrees with ERP to 15 degrees pain-free in order to  tolerate standing and  walking in community  200 feet without an increase in pain by 06/07/15.   Time 8   Period Weeks   Status New               Plan - 04/13/15 1823    Clinical Impression Statement Pt presents for low complexity evaluation for lumbar pain with sciatica. Signs and symptoms are compatible with lateral disc bulge with L4 radiculopathy. Pt presents with impairments including pain, impaired mobility/ROM, and impaired strength, which limit functional abilities with gait, standing, sitting, bending, lifting.  Pt will benefit from oupt PT for 3 times a week for 8 weeks for core strengthening, stretching, education on positioning and body mechanics in order to address these impairments and functional limitations and return pt to pain-free PLOF. Limited assessment of strength, ROM, and further testing due to pt's severe pain. Treatment concentrated on positioning and training of HEP to help decrease pain.  Walking 80 feet increased pt's pain to 8/10.    Pt will benefit from skilled therapeutic intervention in order to improve on the following deficits Abnormal gait;Decreased endurance;Decreased range of motion;Difficulty walking;Decreased mobility;Decreased activity tolerance;Decreased strength;Impaired flexibility;Improper body mechanics;Increased muscle spasms   Rehab Potential Fair   Clinical Impairments Affecting Rehab Potential severity of pain and chronic nature of LBP   PT Frequency 3x / week   PT Duration 8 weeks   PT Treatment/Interventions ADLs/Self Care Home Management;Cryotherapy;Electrical Stimulation;Moist Heat;Traction;Therapeutic exercise;Therapeutic activities;Functional mobility training;Stair training;Gait training;Ultrasound;Neuromuscular re-education;Patient/family education;Manual techniques   PT Next Visit Plan Prone over pillows, progression, if able. Manual Traction,    PT Home Exercise Plan Prone over pillows progression, if appropriate   Consulted and Agree  with Plan of Care Patient         Problem List Patient Active Problem List   Diagnosis Date Noted  . Low back pain 04/07/2015  . Bradycardia 01/23/2015  . Essential hypertension, benign 12/22/2013  . OSA (obstructive sleep apnea) 12/22/2013  . Hypercholesteremia   . Coronary atherosclerosis of native coronary artery     Dollene Cleveland, PT 04/13/2015, 6:43 PM  [facility] [facility] [address] [place], [place], [address] Phone: [phone]   Fax:  [phone]  Name: AMOGH BEAUMAN MRN: [id number] Date of Birth: 03-07-1951

## 2015-04-13 NOTE — Patient Instructions (Addendum)
Positioning: Prone over 3 pillows, 10 mins, 3 times a day. Technique for positional changes to avoid increased pain.   Ice: 20 mins, when positioned prone or sitting. 3 times a day

## 2015-04-13 NOTE — Telephone Encounter (Signed)
rx called in

## 2015-04-13 NOTE — Telephone Encounter (Signed)
Called and discussed.   Will send in tramadol.   Prefers Dr. Saintclair Halsted for neurosurgery.   Corey Apple, MD [facility] 04/13/2015, 9:47 AM

## 2015-04-13 NOTE — Addendum Note (Signed)
Addended by: Timmothy Euler on: 04/13/2015 09:49 AM   Modules accepted: Orders

## 2015-04-17 ENCOUNTER — Ambulatory Visit: Payer: Medicare Other

## 2015-04-17 DIAGNOSIS — M5416 Radiculopathy, lumbar region: Secondary | ICD-10-CM

## 2015-04-17 DIAGNOSIS — R531 Weakness: Secondary | ICD-10-CM

## 2015-04-17 DIAGNOSIS — M5432 Sciatica, left side: Secondary | ICD-10-CM

## 2015-04-17 DIAGNOSIS — R262 Difficulty in walking, not elsewhere classified: Secondary | ICD-10-CM

## 2015-04-17 DIAGNOSIS — M5417 Radiculopathy, lumbosacral region: Secondary | ICD-10-CM | POA: Diagnosis not present

## 2015-04-17 NOTE — Therapy (Signed)
[place], [place], [address] Phone: [phone]   Fax:  [phone]  Physical Therapy Treatment  Patient Details  Name: Corey Duran MRN: [id number] Date of Birth: 01/28/1951 Referring Provider: Wendi Snipes  Encounter Date: 04/17/2015      PT End of Session - 04/17/15 0954    Visit Number 2   Number of Visits 20   Date for PT Re-Evaluation 06/07/15   PT Start Time 0850   PT Stop Time 0940   PT Time Calculation (min) 50 min   Activity Tolerance Patient limited by pain   Behavior During Therapy [facility] for tasks assessed/performed      Past Medical History  Diagnosis Date  . Hypercholesteremia   . GERD (gastroesophageal reflux disease)   . CTS (carpal tunnel syndrome)     1993, 2002  . Insomnia   . LBP (low back pain)   . Coronary atherosclerosis of native coronary artery     05/2006, stent LAD, patent 03/2007, study stent  . BPH (benign prostatic hypertrophy)     followed by Dr. Reece Agar in the past; Now Dr. Diona Fanti  . Laceration of leg     severe, 2009  . OSA (obstructive sleep apnea)     PSG 07/23/10 ESS 11, AHI 16/hr REM 18/hr, RDI 17/hr REM 20/hr, O2 min 87%; CPAP 08/22/10 CPAP 10 with AHI 0  . Tenosynovitis of finger     left third finger    No past surgical history on file.  There were no vitals filed for this visit.  Visit Diagnosis:  Lumbar back pain with radiculopathy affecting left lower extremity  Sciatica associated with disorder of lumbar spine, left  Weakness  Difficulty in walking      Subjective Assessment - 04/17/15 0852    Subjective No significant changes after last visit. Pt reports only partial complaince with HEP. Currently pain rates 8 / 10. Pt presents with severe antalgic gait pattern. Pt reports lesser pain in low back after taking prednisone.    Currently in Pain? Yes   Pain Location Back   Pain Orientation Left;Lower;Mid   Pain Descriptors / Indicators  Sharp;Shooting;Aching;Constant   Pain Type Neuropathic pain;Acute pain   Pain Frequency Constant                         [facility] Adult PT Treatment/Exercise - 04/17/15 0949    Self-Care   Self-Care Other Self-Care Comments   Other Self-Care Comments  positioning, HEP, prone over pillows, training with SPC to use for short distances if not using RW.    Exercises   Exercises Lumbar   Lumbar Exercises: Supine   AB Set Limitations instructed pt in core brace before transition supine to sit to stabilize spine.    Other Supine Lumbar Exercises mini LTR -    Lumbar Exercises: Prone   Other Prone Lumbar Exercises prone over pillows x 3 mins, increased pain, so discontinued.    Modalities   Modalities Electrical Stimulation;Moist Heat   Moist Heat Therapy   Number Minutes Moist Heat 20 Minutes   Moist Heat Location Lumbar Spine   Electrical Stimulation   Electrical Stimulation Location Lumbar   Electrical Stimulation Action IFC   Electrical Stimulation Goals Pain   Manual Therapy   Manual Therapy Manual Traction   Manual therapy comments pt supine, manual traction with belt   Manual Traction 10 mins  PT Education - 04/17/15 [phone]    Education provided Yes   Education Details Importance of HEP    Person(s) Educated Patient   Methods Explanation   Comprehension Verbalized understanding          PT Short Term Goals - 04/13/15 1829    PT SHORT TERM GOAL #1   Title Pt will be I with initial HEP for continued strengthening and mobility   ( 05/14/15)   Time 4   Period Weeks   Status New   PT SHORT TERM GOAL #2   Title Pain will decrease from 6-7/10 to 3-4/10 at rest by 05/14/15.   Time 4   Period Weeks   Status New           PT Long Term Goals - 04/13/15 1830    PT LONG TERM GOAL #1   Title Pt will be able to demo safe lifting, body mechanics, and understand posture as it pertains to his back by 06/07/15    Time 8   Period Weeks    Status New   PT LONG TERM GOAL #2   Title FOTO score will improve from 64% limited to <29% on FOTO to demo improved function and mobility by 06/07/15.   Time 8   Period Weeks   Status New   PT LONG TERM GOAL #3   Title Lumbar extension AROM will improve from 7 degrees with ERP to 15 degrees pain-free in order to tolerate standing and walking in community  200 feet without an increase in pain by 06/07/15.   Time 8   Period Weeks   Status New               Plan - 04/17/15 0954    Clinical Impression Statement Pt continues to present in severe pain with no significant changes since last visit. Pt's partial compliance with HEP limits ability to understand how  it is affecting pt. Attempted prone over pillows with increased pain after 3 mins, so repositioned to sitting with HP, which also was intolerable, so transitioned to supine with HP and IFC with good reponse and pain decreased from 8/10 to 5//10 . Instructed pt in gentle LTR with no increase in pain. Performed gentle manual traction wth pt supine and with belt and good relief, per pt report with pain decreased to 3-4/10 following tx. Instructed pt in abdom brace during transitions ( supine to sit) to help stabilize spine and decrease pain.  Instructed pt in use of SPC if unable to use RW to help decrease pain, and pt reports less pain wiht gait, however, pt was unable to walk continuously back to waiting room x 80 feet without stopping to rest and L LE giving out.    PT Next Visit Plan Prone over pillows, progression, if able. Manual Traction/ mechanical traction, if appropriate.    PT Home Exercise Plan LTR gently supine in AM upon waking to help decrease pain and improve mobility.    Consulted and Agree with Plan of Care Patient        Problem List Patient Active Problem List   Diagnosis Date Noted  . Low back pain 04/07/2015  . Bradycardia 01/23/2015  . Essential hypertension, benign 12/22/2013  . OSA (obstructive sleep apnea)  12/22/2013  . Hypercholesteremia   . Coronary atherosclerosis of native coronary artery     Dollene Cleveland, PT 04/17/2015, 10:02 AM  [facility] [address] [place], [place], [address] Phone: [phone]  Fax:  [phone]  Name: Corey Duran MRN: [id number] Date of Birth: 03-06-1951

## 2015-04-19 ENCOUNTER — Ambulatory Visit: Payer: Medicare Other | Admitting: Physical Therapy

## 2015-04-19 DIAGNOSIS — M5416 Radiculopathy, lumbar region: Secondary | ICD-10-CM

## 2015-04-19 DIAGNOSIS — M5417 Radiculopathy, lumbosacral region: Secondary | ICD-10-CM | POA: Diagnosis not present

## 2015-04-19 DIAGNOSIS — R262 Difficulty in walking, not elsewhere classified: Secondary | ICD-10-CM

## 2015-04-19 DIAGNOSIS — M5432 Sciatica, left side: Secondary | ICD-10-CM

## 2015-04-19 NOTE — Patient Instructions (Signed)
OK to use tennis balls for trigger point release.

## 2015-04-19 NOTE — Therapy (Signed)
[place], [place], [address] Phone: [phone]   Fax:  [phone]  Physical Therapy Treatment  Patient Details  Name: Corey Duran MRN: [id number] Date of Birth: 12-11-1950 Referring Provider: Wendi Snipes  Encounter Date: 04/19/2015      PT End of Session - 04/19/15 1335    Visit Number 3   Number of Visits 20   Date for PT Re-Evaluation --   PT Start Time 0849   PT Stop Time 0945   PT Time Calculation (min) 56 min   Activity Tolerance Patient limited by pain   Behavior During Therapy [facility] for tasks assessed/performed      Past Medical History  Diagnosis Date  . Hypercholesteremia   . GERD (gastroesophageal reflux disease)   . CTS (carpal tunnel syndrome)     1993, 2002  . Insomnia   . LBP (low back pain)   . Coronary atherosclerosis of native coronary artery     05/2006, stent LAD, patent 03/2007, study stent  . BPH (benign prostatic hypertrophy)     followed by Dr. Reece Agar in the past; Now Dr. Diona Fanti  . Laceration of leg     severe, 2009  . OSA (obstructive sleep apnea)     PSG 07/23/10 ESS 11, AHI 16/hr REM 18/hr, RDI 17/hr REM 20/hr, O2 min 87%; CPAP 08/22/10 CPAP 10 with AHI 0  . Tenosynovitis of finger     left third finger    No past surgical history on file.  There were no vitals filed for this visit.  Visit Diagnosis:  Lumbar back pain with radiculopathy affecting left lower extremity  Sciatica associated with disorder of lumbar spine, left  Difficulty in walking      Subjective Assessment - 04/19/15 1302    Subjective Manual traction helped, but only while she was pulling.  PT has not helped yet.  Had to use the wheelchair at the mall.   Currently in Pain? Yes   Pain Score 7    Pain Location Back   Pain Orientation Left;Mid;Lower   Pain Descriptors / Indicators Shooting;Sharp;Aching;Constant   Pain Radiating Towards LT foot   Pain Frequency Constant   Aggravating  Factors  worse am, walking   Pain Relieving Factors using walker,  sitting shifted to the side.                           [facility] Adult PT Treatment/Exercise - 04/19/15 0845    Lumbar Exercises: Sidelying   Other Sidelying Lumbar Exercises McKenzie sidelying RT   Also tried standing.  No symptoms changed.     Moist Heat Therapy   Number Minutes Moist Heat 15 Minutes   Moist Heat Location Lumbar Spine  gluteal too LT   Manual Therapy   Manual therapy comments while sidelying([place]) soft tissue work, at times instrument assisted.  Low back, gluteal/piriformis.  tissue softened.  Pain eased briefly with passive hip IR, Flexion/Adduction with strumming to help lengthen piriforimis.                  PT Education - 04/19/15 1334    Education provided Yes   Education Details  model used to educate re: anatomy.   Person(s) Educated Patient   Methods Explanation   Comprehension Verbalized understanding          PT Short Term Goals - 04/19/15 1342    PT SHORT TERM GOAL #1  Title Pt will be I with initial HEP for continued strengthening and mobility   ( 05/14/15)   Time 4   Period Weeks   Status On-going   PT SHORT TERM GOAL #2   Title Pain will decrease from 6-7/10 to 3-4/10 at rest by 05/14/15.   Baseline varies   Time 4   Period Weeks   Status On-going           PT Long Term Goals - 04/13/15 1830    PT LONG TERM GOAL #1   Title Pt will be able to demo safe lifting, body mechanics, and understand posture as it pertains to his back by 06/07/15    Time 8   Period Weeks   Status New   PT LONG TERM GOAL #2   Title FOTO score will improve from 64% limited to <29% on FOTO to demo improved function and mobility by 06/07/15.   Time 8   Period Weeks   Status New   PT LONG TERM GOAL #3   Title Lumbar extension AROM will improve from 7 degrees with ERP to 15 degrees pain-free in order to tolerate standing and walking in community  200 feet without an  increase in pain by 06/07/15.   Time 8   Period Weeks   Status New               Plan - 04/19/15 1336    Clinical Impression Statement Severe pain continues.  Treatment today eased pain a little and was brief.  He could not bear weight easily through LT leg post session. Pre session he used rair in hallway to make it back to the gym.    I went to his truck to get his walker so he could walk. No new goals met.    PT Next Visit Plan Prone over pillows, progression, if able. Manual Traction/ mechanical traction, if appropriate. Assess session   PT Home Exercise Plan Mckenzie lateral shift try.      Consulted and Agree with Plan of Care Patient        Problem List Patient Active Problem List   Diagnosis Date Noted  . Low back pain 04/07/2015  . Bradycardia 01/23/2015  . Essential hypertension, benign 12/22/2013  . OSA (obstructive sleep apnea) 12/22/2013  . Hypercholesteremia   . Coronary atherosclerosis of native coronary artery     [facility] 04/19/2015, 1:44 PM  [facility] [address] [place], [place], [address] Phone: [phone]   Fax:  [phone]  Name: Corey Duran MRN: [id number] Date of Birth: April 17, 1950    Melvenia Needles, PTA 04/19/2015 1:44 PM Phone: [phone] Fax: [phone]

## 2015-04-21 ENCOUNTER — Ambulatory Visit: Payer: Medicare Other | Admitting: Physical Therapy

## 2015-04-21 DIAGNOSIS — M5417 Radiculopathy, lumbosacral region: Secondary | ICD-10-CM | POA: Diagnosis not present

## 2015-04-21 DIAGNOSIS — R262 Difficulty in walking, not elsewhere classified: Secondary | ICD-10-CM

## 2015-04-21 DIAGNOSIS — M5432 Sciatica, left side: Secondary | ICD-10-CM

## 2015-04-21 DIAGNOSIS — R531 Weakness: Secondary | ICD-10-CM

## 2015-04-21 DIAGNOSIS — M5416 Radiculopathy, lumbar region: Secondary | ICD-10-CM

## 2015-04-21 NOTE — Therapy (Signed)
[place], [place], [address] Phone: [phone]   Fax:  [phone]  Physical Therapy Treatment  Patient Details  Name: Corey Duran MRN: [id number] Date of Birth: 11-Aug-1950 Referring Provider: Wendi Snipes  Encounter Date: 04/21/2015      PT End of Session - 04/21/15 0955    Visit Number 4   Number of Visits 20   Date for PT Re-Evaluation 06/07/15   PT Start Time 0930   PT Stop Time 1004   PT Time Calculation (min) 34 min   Activity Tolerance Patient limited by pain   Behavior During Therapy [facility] for tasks assessed/performed      Past Medical History  Diagnosis Date  . Hypercholesteremia   . GERD (gastroesophageal reflux disease)   . CTS (carpal tunnel syndrome)     1993, 2002  . Insomnia   . LBP (low back pain)   . Coronary atherosclerosis of native coronary artery     05/2006, stent LAD, patent 03/2007, study stent  . BPH (benign prostatic hypertrophy)     followed by Dr. Reece Agar in the past; Now Dr. Diona Fanti  . Laceration of leg     severe, 2009  . OSA (obstructive sleep apnea)     PSG 07/23/10 ESS 11, AHI 16/hr REM 18/hr, RDI 17/hr REM 20/hr, O2 min 87%; CPAP 08/22/10 CPAP 10 with AHI 0  . Tenosynovitis of finger     left third finger    No past surgical history on file.  There were no vitals filed for this visit.  Visit Diagnosis:  Lumbar back pain with radiculopathy affecting left lower extremity  Sciatica associated with disorder of lumbar spine, left  Difficulty in walking  Weakness      Subjective Assessment - 04/21/15 0934    Subjective Pt reports it "feels the same". When asked if there had been any relief pt denied and declared that medication only is providing temporary relief.    Currently in Pain? Yes   Pain Score 6    Pain Location Back   Pain Orientation Left;Mid;Lower   Pain Descriptors / Indicators Constant;Radiating;Sharp   Pain Radiating Towards LT foot    Pain Onset 1 to 4 weeks ago   Pain Frequency Constant                         [facility] Adult PT Treatment/Exercise - 04/21/15 0001    Exercises   Exercises Knee/Hip   Knee/Hip Exercises: Aerobic   Nustep L1 x 3 min; LE and UE; pain increased and radiated into Lt foot and stopped   Modalities   Modalities Traction   Traction   Type of Traction Lumbar   Min (lbs) 45   Max (lbs) 60   Hold Time 60   Rest Time 20   Time 15                PT Education - 04/21/15 0955    Education provided No          PT Short Term Goals - 04/19/15 1342    PT SHORT TERM GOAL #1   Title Pt will be I with initial HEP for continued strengthening and mobility   ( 05/14/15)   Time 4   Period Weeks   Status On-going   PT SHORT TERM GOAL #2   Title Pain will decrease from 6-7/10 to 3-4/10 at rest by 05/14/15.   Baseline varies  Time 4   Period Weeks   Status On-going           PT Long Term Goals - 04/13/15 1830    PT LONG TERM GOAL #1   Title Pt will be able to demo safe lifting, body mechanics, and understand posture as it pertains to his back by 06/07/15    Time 8   Period Weeks   Status New   PT LONG TERM GOAL #2   Title FOTO score will improve from 64% limited to <29% on FOTO to demo improved function and mobility by 06/07/15.   Time 8   Period Weeks   Status New   PT LONG TERM GOAL #3   Title Lumbar extension AROM will improve from 7 degrees with ERP to 15 degrees pain-free in order to tolerate standing and walking in community  200 feet without an increase in pain by 06/07/15.   Time 8   Period Weeks   Status New               Plan - 04/21/15 1004    Clinical Impression Statement Pt continues to be in severe pain. Pt unable to walk into gym without being bent over walker. Attempted aerobic exercise but had to discontinue due to increasing LBP and radiating pain into Lt foot. Mechanical traction attempted to decrease symptoms as manual traction  provided temporary relief. Traction discontinued 5 min early due to increased symptoms.    PT Next Visit Plan Gentle stretching; modalities; assess session         Problem List Patient Active Problem List   Diagnosis Date Noted  . Low back pain 04/07/2015  . Bradycardia 01/23/2015  . Essential hypertension, benign 12/22/2013  . OSA (obstructive sleep apnea) 12/22/2013  . Hypercholesteremia   . Coronary atherosclerosis of native coronary artery       Ammie Ferrier, SPT 04/21/2015  10:13 AM  [facility] [address] [place], [place], [address] Phone: [phone]   Fax:  [phone]  Name: Corey Duran MRN: [id number] Date of Birth: 1951-02-04

## 2015-04-24 ENCOUNTER — Ambulatory Visit: Payer: Medicare Other

## 2015-04-24 ENCOUNTER — Telehealth: Payer: Self-pay

## 2015-04-24 ENCOUNTER — Telehealth: Payer: Self-pay | Admitting: Family Medicine

## 2015-04-24 DIAGNOSIS — M5416 Radiculopathy, lumbar region: Secondary | ICD-10-CM

## 2015-04-24 DIAGNOSIS — R531 Weakness: Secondary | ICD-10-CM

## 2015-04-24 DIAGNOSIS — M5417 Radiculopathy, lumbosacral region: Secondary | ICD-10-CM | POA: Diagnosis not present

## 2015-04-24 DIAGNOSIS — M5432 Sciatica, left side: Secondary | ICD-10-CM

## 2015-04-24 DIAGNOSIS — R262 Difficulty in walking, not elsewhere classified: Secondary | ICD-10-CM

## 2015-04-24 NOTE — Telephone Encounter (Signed)
We have referred to neurosurg. Will ask debbie to look into where they are in the process.    Laroy Apple, MD [facility] 04/24/2015, 5:11 PM

## 2015-04-24 NOTE — Telephone Encounter (Signed)
PT called MD, Dr Wendi Snipes, to notify that pt is no better and objective measures indicate that the pt is worse. PT recommended MRI and neurosurgery consult.

## 2015-04-24 NOTE — Therapy (Signed)
[place], [place], [address] Phone: [phone]   Fax:  [phone]  Physical Therapy Treatment  Patient Details  Name: THELMER KLITZ MRN: [id number] Date of Birth: [birth year] Referring Provider: Wendi Snipes  Encounter Date: 04/24/2015      PT End of Session - 04/24/15 1748    Visit Number 5   Number of Visits 20   Date for PT Re-Evaluation 06/07/15   PT Start Time 0930   PT Stop Time 1015   PT Time Calculation (min) 45 min   Activity Tolerance Patient limited by pain   Behavior During Therapy [facility] for tasks assessed/performed      Past Medical History  Diagnosis Date  . Hypercholesteremia   . GERD (gastroesophageal reflux disease)   . CTS (carpal tunnel syndrome)     1993, 2002  . Insomnia   . LBP (low back pain)   . Coronary atherosclerosis of native coronary artery     05/2006, stent LAD, patent 03/2007, study stent  . BPH (benign prostatic hypertrophy)     followed by Dr. Reece Agar in the past; Now Dr. Diona Fanti  . Laceration of leg     severe, 2009  . OSA (obstructive sleep apnea)     PSG 07/23/10 ESS 11, AHI 16/hr REM 18/hr, RDI 17/hr REM 20/hr, O2 min 87%; CPAP 08/22/10 CPAP 10 with AHI 0  . Tenosynovitis of finger     left third finger    No past surgical history on file.  There were no vitals filed for this visit.  Visit Diagnosis:  Lumbar back pain with radiculopathy affecting left lower extremity  Sciatica associated with disorder of lumbar spine, left  Difficulty in walking  Weakness      Subjective Assessment - 04/24/15 1745    Subjective No significant change since beginning PT> Pt reports he went to the movies and "almost called 911" to get him out of the theater, since he did not bring his walker.    Currently in Pain? Yes   Pain Score 5    Pain Location Back   Pain Orientation Left;Mid;Lower   Pain Descriptors / Indicators Constant;Radiating;Sharp   Pain Type  Neuropathic pain;Acute pain   Pain Radiating Towards Lt foot    Pain Onset More than a month ago   Pain Frequency Constant            [facility] PT Assessment - 04/24/15 0001    Observation/Other Assessments   Focus on Therapeutic Outcomes (FOTO)  67% limited   AROM   Lumbar Flexion 30   Lumbar Extension 4   Lumbar - Right Side Bend 0   Lumbar - Left Side Bend 0                     [facility] Adult PT Treatment/Exercise - 04/24/15 0001    Moist Heat Therapy   Number Minutes Moist Heat 20 Minutes   Moist Heat Location Lumbar Spine   Manual Therapy   Manual Therapy Myofascial release;Manual Traction   Manual therapy comments pt supine, manual traction with belt   Myofascial Release deep tissue work to L piriformis and glutes.    Manual Traction 10 mins   good relief during traction. Symptom relief does not last.                PT Education - 04/24/15 1747    Education provided Yes   Education Details PT will cal MD to notify  that pt is no better and objective measures indicatre pt is worse: FOTO and AROM.    Person(s) Educated Patient   Methods Explanation;Demonstration;Verbal cues   Comprehension Verbalized understanding          PT Short Term Goals - 04/19/15 1342    PT SHORT TERM GOAL #1   Title Pt will be I with initial HEP for continued strengthening and mobility   ( 05/14/15)   Time 4   Period Weeks   Status On-going   PT SHORT TERM GOAL #2   Title Pain will decrease from 6-7/10 to 3-4/10 at rest by 05/14/15.   Baseline varies   Time 4   Period Weeks   Status On-going           PT Long Term Goals - 04/13/15 1830    PT LONG TERM GOAL #1   Title Pt will be able to demo safe lifting, body mechanics, and understand posture as it pertains to his back by 06/07/15    Time 8   Period Weeks   Status New   PT LONG TERM GOAL #2   Title FOTO score will improve from 64% limited to <29% on FOTO to demo improved function and mobility by 06/07/15.   Time  8   Period Weeks   Status New   PT LONG TERM GOAL #3   Title Lumbar extension AROM will improve from 7 degrees with ERP to 15 degrees pain-free in order to tolerate standing and walking in community  200 feet without an increase in pain by 06/07/15.   Time 8   Period Weeks   Status New               Plan - 04/24/15 1749    Clinical Impression Statement Pt has attended 5 visits of PT following eval for low back pain with radiculopathy. Pt is no better since beginning PT. In fact, objective measures indicate pt is worse: FOTO scored 3% more limited than at eval. Lumbar ROM decreased since eval with severe pain at endrange causing Lt knee to buckle. Pt reports that he "almost called 911" this weekend after seeing a movie and was unable to exit the theater without severe pain. PT called MD, Dr Wendi Snipes, to notify that pt is no better and objective measures indicate that the pt is worse. PT recommended MRI and neurosurgery consult. Pt reports relief during manual traction, but symptoms return upon returning to sit.    PT Next Visit Plan Gentle stretching; modalities; assess session    PT Home Exercise Plan Mckenzie lateral shift try.      Consulted and Agree with Plan of Care Patient        Problem List Patient Active Problem List   Diagnosis Date Noted  . Low back pain 04/07/2015  . Bradycardia 01/23/2015  . Essential hypertension, benign 12/22/2013  . OSA (obstructive sleep apnea) 12/22/2013  . Hypercholesteremia   . Coronary atherosclerosis of native coronary artery     Dollene Cleveland, PT 04/24/2015, 5:59 PM  [place], [place], [address] Phone: [phone]   Fax:  [phone]  Name: BRADIN HARDIN MRN: [id number] Date of Birth: 1950/11/11

## 2015-04-25 NOTE — Telephone Encounter (Signed)
Please address  He has a referral for neurosurgeon placed

## 2015-04-26 ENCOUNTER — Ambulatory Visit: Payer: Medicare Other

## 2015-04-26 DIAGNOSIS — R262 Difficulty in walking, not elsewhere classified: Secondary | ICD-10-CM

## 2015-04-26 DIAGNOSIS — M5417 Radiculopathy, lumbosacral region: Secondary | ICD-10-CM | POA: Diagnosis not present

## 2015-04-26 DIAGNOSIS — M5416 Radiculopathy, lumbar region: Secondary | ICD-10-CM

## 2015-04-26 DIAGNOSIS — M5432 Sciatica, left side: Secondary | ICD-10-CM

## 2015-04-26 DIAGNOSIS — R531 Weakness: Secondary | ICD-10-CM

## 2015-04-26 NOTE — Therapy (Addendum)
[place], [place], [address] Phone: [phone]   Fax:  [phone]  Physical Therapy Treatment  Patient Details  Name: Corey Duran MRN: [id number] Date of Birth: Nov 13, 1950 Referring Provider: Wendi Snipes  Encounter Date: 04/26/2015      PT End of Session - 04/26/15 1247    Visit Number 6   Number of Visits 20   Date for PT Re-Evaluation 06/07/15   PT Start Time 0930   PT Stop Time 1015   PT Time Calculation (min) 45 min   Activity Tolerance Patient limited by pain   Behavior During Therapy [facility] for tasks assessed/performed      Past Medical History  Diagnosis Date  . Hypercholesteremia   . GERD (gastroesophageal reflux disease)   . CTS (carpal tunnel syndrome)     1993, 2002  . Insomnia   . LBP (low back pain)   . Coronary atherosclerosis of native coronary artery     05/2006, stent LAD, patent 03/2007, study stent  . BPH (benign prostatic hypertrophy)     followed by Dr. Reece Agar in the past; Now Dr. Diona Fanti  . Laceration of leg     severe, 2009  . OSA (obstructive sleep apnea)     PSG 07/23/10 ESS 11, AHI 16/hr REM 18/hr, RDI 17/hr REM 20/hr, O2 min 87%; CPAP 08/22/10 CPAP 10 with AHI 0  . Tenosynovitis of finger     left third finger    No past surgical history on file.  There were no vitals filed for this visit.  Visit Diagnosis:  Lumbar back pain with radiculopathy affecting left lower extremity  Sciatica associated with disorder of lumbar spine, left  Difficulty in walking  Weakness      Subjective Assessment - 04/26/15 1238    Subjective Pt continues to have no significant changes since beginning PT. Pt reports Dr Wendi Snipes called and said he is follow-ing up with neurosurgery referral.     Currently in Pain? Yes   Pain Score 3    Pain Location Back   Pain Orientation Left;Mid;Lower   Pain Descriptors / Indicators Constant;Sharp;Radiating   Pain Type Neuropathic  pain;Acute pain   Pain Onset More than a month ago   Pain Frequency Constant                         [facility] Adult PT Treatment/Exercise - 04/26/15 0001    Moist Heat Therapy   Number Minutes Moist Heat 20 Minutes   Moist Heat Location Lumbar Spine   Manual Therapy   Manual Therapy Myofascial release;Manual Traction   Manual therapy comments pt supine, manual traction with belt   Myofascial Release deep tissue work to L piriformis and glutes.    Manual Traction 10 mins   good relief during traction. Symptom relief does not last.                PT Education - 04/26/15 1245    Education provided Yes   Education Details No significant changes in symptoms, plan to discontinue PT at this time. Pt agreeable. PT instructed pt to call to follow-up with MD and schedule appt for spine specialist or neurosurgeon.    Person(s) Educated Patient   Methods Explanation;Demonstration;Verbal cues   Comprehension Verbalized understanding          PT Short Term Goals - 04/26/15 1253    PT SHORT TERM GOAL #1   Title Pt will be  I with initial HEP for continued strengthening and mobility   ( 05/14/15)   Time 4   Period Weeks   Status Not Met   PT SHORT TERM GOAL #2   Title Pain will decrease from 6-7/10 to 3-4/10 at rest by 05/14/15.   Time 4   Period Weeks   Status Not Met           PT Long Term Goals - 04/26/15 1253    PT LONG TERM GOAL #1   Title Pt will be able to demo safe lifting, body mechanics, and understand posture as it pertains to his back by 06/07/15    Time 8   Status Not Met   PT LONG TERM GOAL #2   Title FOTO score will improve from 64% limited to <29% on FOTO to demo improved function and mobility by 06/07/15.   Baseline 04/24/15: 67% limited    Time 8   Period Weeks   Status Not Met   PT LONG TERM GOAL #3   Title Lumbar extension AROM will improve from 7 degrees with ERP to 15 degrees pain-free in order to tolerate standing and walking in community   200 feet without an increase in pain by 06/07/15.   Time 8   Period Weeks   Status Not Met               Plan - 04/26/15 1247    Clinical Impression Statement No significant changes following tx or since beginning PT. Therefore, pt will be referred back to MD and cancel all furture PT  appts. Pt does have temporary relief with manual traction supine, but pain returns once released. PT called MD to notify MD of no change and need for MRI and neurosurgery consult.  Plan to call pt in 2 weeks to follow pt on status and DC if no furture PT needed.         Problem List Patient Active Problem List   Diagnosis Date Noted  . Low back pain 04/07/2015  . Bradycardia 01/23/2015  . Essential hypertension, benign 12/22/2013  . OSA (obstructive sleep apnea) 12/22/2013  . Hypercholesteremia   . Coronary atherosclerosis of native coronary artery     Dollene Cleveland, PT 04/26/2015, 12:55 PM  [facility] [address] [place], [place], [address] Phone: [phone]   Fax:  [phone]  Name: Corey Duran MRN: [id number] Date of Birth: 10-13-1950   PHYSICAL THERAPY DISCHARGE SUMMARY  Visits from Start of Care: 6  Current functional level related to goals / functional outcomes: See above   Remaining deficits: See above   Education / Equipment: HEP. Avoid painful positions/ activities.   Plan: Patient agrees to discharge.  Patient goals were not met. Patient is being discharged due to lack of progress.  ?????          Dollene Cleveland, PT, DPT 05/11/2015 4:37 PM Phone: [phone] Fax: [phone]

## 2015-05-04 DIAGNOSIS — M5126 Other intervertebral disc displacement, lumbar region: Secondary | ICD-10-CM | POA: Diagnosis not present

## 2015-05-04 DIAGNOSIS — M5137 Other intervertebral disc degeneration, lumbosacral region: Secondary | ICD-10-CM | POA: Diagnosis not present

## 2015-05-05 ENCOUNTER — Encounter: Payer: Medicare Other | Admitting: Physical Therapy

## 2015-05-05 DIAGNOSIS — M47817 Spondylosis without myelopathy or radiculopathy, lumbosacral region: Secondary | ICD-10-CM | POA: Diagnosis not present

## 2015-05-05 DIAGNOSIS — M5137 Other intervertebral disc degeneration, lumbosacral region: Secondary | ICD-10-CM | POA: Diagnosis not present

## 2015-05-09 ENCOUNTER — Telehealth: Payer: Self-pay | Admitting: Interventional Cardiology

## 2015-05-09 ENCOUNTER — Ambulatory Visit: Payer: Medicare Other | Admitting: Family Medicine

## 2015-05-09 DIAGNOSIS — M5126 Other intervertebral disc displacement, lumbar region: Secondary | ICD-10-CM | POA: Diagnosis not present

## 2015-05-09 DIAGNOSIS — M5416 Radiculopathy, lumbar region: Secondary | ICD-10-CM | POA: Diagnosis not present

## 2015-05-09 NOTE — Telephone Encounter (Signed)
Walk in pt form-pt needs ok to stop taking med-Lynn back Wednesday 2/8

## 2015-05-10 ENCOUNTER — Encounter: Payer: Medicare Other | Admitting: Physical Therapy

## 2015-05-12 ENCOUNTER — Encounter: Payer: Medicare Other | Admitting: Physical Therapy

## 2015-05-15 ENCOUNTER — Encounter: Payer: Medicare Other | Admitting: Physical Therapy

## 2015-05-17 ENCOUNTER — Encounter: Payer: Medicare Other | Admitting: Physical Therapy

## 2015-05-19 ENCOUNTER — Encounter: Payer: Medicare Other | Admitting: Physical Therapy

## 2015-05-24 ENCOUNTER — Encounter: Payer: Medicare Other | Admitting: Physical Therapy

## 2015-05-26 ENCOUNTER — Encounter: Payer: Medicare Other | Admitting: Physical Therapy

## 2015-05-29 ENCOUNTER — Encounter: Payer: Medicare Other | Admitting: Physical Therapy

## 2015-06-01 DIAGNOSIS — M5126 Other intervertebral disc displacement, lumbar region: Secondary | ICD-10-CM | POA: Diagnosis not present

## 2015-06-01 DIAGNOSIS — M5416 Radiculopathy, lumbar region: Secondary | ICD-10-CM | POA: Diagnosis not present

## 2015-06-02 ENCOUNTER — Encounter: Payer: Medicare Other | Admitting: Physical Therapy

## 2015-06-07 ENCOUNTER — Encounter: Payer: Medicare Other | Admitting: Physical Therapy

## 2015-06-07 ENCOUNTER — Other Ambulatory Visit: Payer: Self-pay | Admitting: Family Medicine

## 2015-06-08 MED ORDER — EZETIMIBE-SIMVASTATIN 10-80 MG PO TABS: 0.5000 | ORAL_TABLET | Freq: Every day | ORAL | Status: DC

## 2015-06-26 DIAGNOSIS — M5126 Other intervertebral disc displacement, lumbar region: Secondary | ICD-10-CM | POA: Diagnosis not present

## 2015-07-17 ENCOUNTER — Ambulatory Visit (INDEPENDENT_AMBULATORY_CARE_PROVIDER_SITE_OTHER): Payer: Medicare Other | Admitting: Family Medicine

## 2015-07-17 ENCOUNTER — Encounter: Payer: Self-pay | Admitting: Family Medicine

## 2015-07-17 ENCOUNTER — Encounter (INDEPENDENT_AMBULATORY_CARE_PROVIDER_SITE_OTHER): Payer: Self-pay

## 2015-07-17 ENCOUNTER — Encounter: Payer: Self-pay | Admitting: *Deleted

## 2015-07-17 VITALS — BP 123/74 | HR 49 | Temp 97.4°F | Ht 66.0 in | Wt 177.6 lb

## 2015-07-17 DIAGNOSIS — I10 Essential (primary) hypertension: Secondary | ICD-10-CM | POA: Diagnosis not present

## 2015-07-17 DIAGNOSIS — Z0000 Encounter for general adult medical examination without abnormal findings: Secondary | ICD-10-CM | POA: Diagnosis not present

## 2015-07-17 DIAGNOSIS — M5442 Lumbago with sciatica, left side: Secondary | ICD-10-CM

## 2015-07-17 DIAGNOSIS — I2510 Atherosclerotic heart disease of native coronary artery without angina pectoris: Secondary | ICD-10-CM

## 2015-07-17 NOTE — Progress Notes (Signed)
   HPI  Patient presents today for a physical exam and follow-up hypertension, CAD, back pain.    Patient feels well, he's had some lower back pain and has had 2 recent epidural injections which have helped some but not completely.  He denies any chest pain, shortness of breath, leg edema, or palpitations.  He has a history of CAD with stent in 2008.  His last colonoscopy was 2012 at [place].  He is taking all his medications normally.  He had a Pneumovax vaccination on 04/07/2015 at [place].  He is fasting.  He has good medication compliance.  PMH: Smoking status noted ROS: Per HPI  Objective: BP 123/74 mmHg  Pulse 49  Temp(Src) 97.4 F (36.3 C) (Oral)  Ht _0  (1.676 m)  Wt 177 lb 9.6 oz (80.559 kg)  BMI 28.68 kg/m2 Gen: NAD, alert, cooperative with exam HEENT: NCAT, PERRLA, right TM normal, left TM obscured by cerumen, oropharynx clear CV: RRR, good S1/S2, no murmur Resp: CTABL, no wheezes, non-labored Abd: SNTND, BS present, no guarding or organomegaly Ext: No edema, warm Neuro: Alert and oriented, strength 5/5 and sensation intact in all 4 extremities, 2+ patellar tendon reflexes bilaterally  Assessment and plan:  # Annual physical exam Normal exam Labs today, fasting Recent PSA 2.3 on January 18. Immunization recently also Pneumovax on 04/07/2015, Prevnar next year.  # Lower back pain Being treated by neurosurgery, recommended close follow-up with them. Improving with epidural injections Also has Ultram for pain, using only rarely  HTN Controlled, brady stable and asymptomatic No changes  CAD No chest pain Follows routinely with cards Continue plavix- discussed    Orders Placed This Encounter  Procedures  . Lipid Panel  . [place], MD [facility] 07/17/2015, 10:32 AM

## 2015-07-17 NOTE — Patient Instructions (Addendum)
Great to see you!  Lets plan to see you in about 6 months unless you need [place] sooner.   We will call with lab results within 1 week

## 2015-07-19 LAB — LIPID PANEL
CHOL/HDL RATIO: 3.5 ratio
CHOLESTEROL TOTAL: 145 mg/dL
HDL: 41 mg/dL (ref >39)
LDL Calculated: 74 mg/dL
TRIGLYCERIDES: 152 mg/dL
VLDL Cholesterol Cal: 30 mg/dL (ref 5–40)

## 2015-07-19 LAB — CMP14+EGFR
A/G RATIO: 1.9 (ref 1.2–2.2)
ALBUMIN: 4.1 g/dL
ALT: 22 IU/L
AST: 20 IU/L (ref 0–40)
Alkaline Phosphatase: 56 IU/L
BUN/Creatinine Ratio: 19
BUN: 18 mg/dL
Bilirubin Total: 0.5 mg/dL (ref 0.0–1.2)
CALCIUM: 9 mg/dL
CO2: 22 mmol/L (ref 18–29)
Chloride: 102 mmol/L (ref 96–106)
Creatinine, Ser: 0.96 mg/dL
Globulin, Total: 2.2 g/dL (ref 1.5–4.5)
Glucose: 95 mg/dL (ref 65–99)
POTASSIUM: 4.2 mmol/L (ref 3.5–5.2)
Sodium: 142 mmol/L (ref 134–144)
TOTAL PROTEIN: 6.3 g/dL (ref 6.0–8.5)

## 2015-08-17 DIAGNOSIS — M545 Low back pain: Secondary | ICD-10-CM | POA: Diagnosis not present

## 2015-08-17 DIAGNOSIS — M5126 Other intervertebral disc displacement, lumbar region: Secondary | ICD-10-CM | POA: Diagnosis not present

## 2015-08-17 DIAGNOSIS — M5416 Radiculopathy, lumbar region: Secondary | ICD-10-CM | POA: Diagnosis not present

## 2015-08-23 DIAGNOSIS — M5416 Radiculopathy, lumbar region: Secondary | ICD-10-CM | POA: Diagnosis not present

## 2015-08-23 DIAGNOSIS — M5126 Other intervertebral disc displacement, lumbar region: Secondary | ICD-10-CM | POA: Diagnosis not present

## 2015-08-23 DIAGNOSIS — M545 Low back pain: Secondary | ICD-10-CM | POA: Diagnosis not present

## 2015-08-25 DIAGNOSIS — M5416 Radiculopathy, lumbar region: Secondary | ICD-10-CM | POA: Diagnosis not present

## 2015-08-25 DIAGNOSIS — M5126 Other intervertebral disc displacement, lumbar region: Secondary | ICD-10-CM | POA: Diagnosis not present

## 2015-08-25 DIAGNOSIS — M545 Low back pain: Secondary | ICD-10-CM | POA: Diagnosis not present

## 2015-09-01 DIAGNOSIS — M5126 Other intervertebral disc displacement, lumbar region: Secondary | ICD-10-CM | POA: Diagnosis not present

## 2015-09-01 DIAGNOSIS — H6121 Impacted cerumen, right ear: Secondary | ICD-10-CM | POA: Diagnosis not present

## 2015-09-01 DIAGNOSIS — M545 Low back pain: Secondary | ICD-10-CM | POA: Diagnosis not present

## 2015-09-01 DIAGNOSIS — M5416 Radiculopathy, lumbar region: Secondary | ICD-10-CM | POA: Diagnosis not present

## 2015-09-04 DIAGNOSIS — M545 Low back pain: Secondary | ICD-10-CM | POA: Diagnosis not present

## 2015-09-04 DIAGNOSIS — M5416 Radiculopathy, lumbar region: Secondary | ICD-10-CM | POA: Diagnosis not present

## 2015-09-04 DIAGNOSIS — M5126 Other intervertebral disc displacement, lumbar region: Secondary | ICD-10-CM | POA: Diagnosis not present

## 2015-09-06 DIAGNOSIS — M545 Low back pain: Secondary | ICD-10-CM | POA: Diagnosis not present

## 2015-09-06 DIAGNOSIS — M5416 Radiculopathy, lumbar region: Secondary | ICD-10-CM | POA: Diagnosis not present

## 2015-09-06 DIAGNOSIS — M5126 Other intervertebral disc displacement, lumbar region: Secondary | ICD-10-CM | POA: Diagnosis not present

## 2015-09-11 DIAGNOSIS — M5416 Radiculopathy, lumbar region: Secondary | ICD-10-CM | POA: Diagnosis not present

## 2015-09-11 DIAGNOSIS — M545 Low back pain: Secondary | ICD-10-CM | POA: Diagnosis not present

## 2015-09-11 DIAGNOSIS — M5126 Other intervertebral disc displacement, lumbar region: Secondary | ICD-10-CM | POA: Diagnosis not present

## 2015-09-13 DIAGNOSIS — M5126 Other intervertebral disc displacement, lumbar region: Secondary | ICD-10-CM | POA: Diagnosis not present

## 2015-09-13 DIAGNOSIS — M5416 Radiculopathy, lumbar region: Secondary | ICD-10-CM | POA: Diagnosis not present

## 2015-09-13 DIAGNOSIS — M545 Low back pain: Secondary | ICD-10-CM | POA: Diagnosis not present

## 2015-09-18 DIAGNOSIS — M5126 Other intervertebral disc displacement, lumbar region: Secondary | ICD-10-CM | POA: Diagnosis not present

## 2015-09-18 DIAGNOSIS — M5416 Radiculopathy, lumbar region: Secondary | ICD-10-CM | POA: Diagnosis not present

## 2015-09-18 DIAGNOSIS — M545 Low back pain: Secondary | ICD-10-CM | POA: Diagnosis not present

## 2015-09-21 DIAGNOSIS — M5416 Radiculopathy, lumbar region: Secondary | ICD-10-CM | POA: Diagnosis not present

## 2015-09-21 DIAGNOSIS — M545 Low back pain: Secondary | ICD-10-CM | POA: Diagnosis not present

## 2015-09-21 DIAGNOSIS — M5126 Other intervertebral disc displacement, lumbar region: Secondary | ICD-10-CM | POA: Diagnosis not present

## 2015-09-25 DIAGNOSIS — M545 Low back pain: Secondary | ICD-10-CM | POA: Diagnosis not present

## 2015-09-25 DIAGNOSIS — M5416 Radiculopathy, lumbar region: Secondary | ICD-10-CM | POA: Diagnosis not present

## 2015-09-25 DIAGNOSIS — M5126 Other intervertebral disc displacement, lumbar region: Secondary | ICD-10-CM | POA: Diagnosis not present

## 2015-12-29 ENCOUNTER — Encounter: Payer: Self-pay | Admitting: Family Medicine

## 2015-12-29 ENCOUNTER — Ambulatory Visit (INDEPENDENT_AMBULATORY_CARE_PROVIDER_SITE_OTHER): Payer: Medicare Other | Admitting: Family Medicine

## 2015-12-29 VITALS — BP 116/74 | HR 55 | Temp 97.4°F | Ht 66.0 in | Wt 180.4 lb

## 2015-12-29 DIAGNOSIS — J209 Acute bronchitis, unspecified: Secondary | ICD-10-CM | POA: Diagnosis not present

## 2015-12-29 MED ORDER — HYDROCODONE-HOMATROPINE 5-1.5 MG/5ML PO SYRP: 5.0000 mL | ORAL_SOLUTION | Freq: Four times a day (QID) | ORAL | 0 refills | Status: DC | PRN

## 2015-12-29 MED ORDER — AMOXICILLIN-POT CLAVULANATE 875-125 MG PO TABS: 1.0000 | ORAL_TABLET | Freq: Two times a day (BID) | ORAL | 0 refills | Status: DC

## 2015-12-29 NOTE — Patient Instructions (Signed)
Great to see you!  Take all of the antibiotics  Try mucinex DM 12 hour for cough  Hycodan cough syrup has hydrocodone in it, do not drive after you take it   Acute Bronchitis Bronchitis is when the airways that extend from the windpipe into the lungs get red, puffy, and painful (inflamed). Bronchitis often causes thick spit (mucus) to develop. This leads to a cough. A cough is the most common symptom of bronchitis. In acute bronchitis, the condition usually begins suddenly and goes away over time (usually in 2 weeks). Smoking, allergies, and asthma can make bronchitis worse. Repeated episodes of bronchitis may cause more lung problems. HOME CARE  Rest.  Drink enough fluids to keep your pee (urine) clear or pale yellow (unless you need to limit fluids as told by your doctor).  Only take over-the-counter or prescription medicines as told by your doctor.  Avoid smoking and secondhand smoke. These can make bronchitis worse. If you are a smoker, think about using nicotine gum or skin patches. Quitting smoking will help your lungs heal faster.  Reduce the chance of getting bronchitis again by:  Washing your hands often.  Avoiding people with cold symptoms.  Trying not to touch your hands to your mouth, nose, or eyes.  Follow up with your doctor as told. GET HELP IF: Your symptoms do not improve after 1 week of treatment. Symptoms include:  Cough.  Fever.  Coughing up thick spit.  Body aches.  Chest congestion.  Chills.  Shortness of breath.  Sore throat. GET HELP RIGHT AWAY IF:   You have an increased fever.  You have chills.  You have severe shortness of breath.  You have bloody thick spit (sputum).  You throw up (vomit) often.  You lose too much body fluid (dehydration).  You have a severe headache.  You faint. MAKE SURE YOU:   Understand these instructions.  Will watch your condition.  Will get help right away if you are not doing well or get  worse.   This information is not intended to replace advice given to you by your health care provider. Make sure you discuss any questions you have with your health care provider.   Document Released: 09/04/2007 Document Revised: 11/18/2012 Document Reviewed: 09/08/2012 Elsevier Interactive Patient Education [employer].

## 2015-12-29 NOTE — Progress Notes (Signed)
   HPI  Patient presents today with acute illness.  Patient states he's had 2 weeks of persistent cough, congestion, headache, and frontal sinus pain and pressure. His symptoms don't seem to be improving at all. He also has mild shortness of breath.  Denies any chest pain.  He has used over-the-counter decongestant only a few times, he is nervous about using decongestants with his heart condition.  He also has malaise, however he is tolerating food and fluids normally.  PMH: Smoking status noted ROS: Per HPI  Objective: BP 116/74   Pulse (!) 55   Temp 97.4 F (36.3 C) (Oral)   Ht 5\' 6"  (1.676 m)   Wt 180 lb 6.4 oz (81.8 kg)   BMI 29.12 kg/m  Gen: NAD, alert, cooperative with exam HEENT: NCAT, oropharynx clear, no tenderness to palpation of the frontal or maxillary sinuses CV: RRR, good S1/S2, no murmur Resp: Good air movement, very wheezy cough, however no wheezing appreciated in the lung exam, nonlabored Ext: No edema, warm Neuro: Alert and oriented, No gross deficits  Assessment and plan:  # Acute bronchitis Likely with smoldering acute sinusitis Treating with Augmentin, also given Hycodan cough syrup for nighttime cough Discussed over-the-counter cough medications that were appropriate Return to clinic with any concerns or failure to improve, I did offer to treat with a short course of prednisone if symptoms do not improve then for 5 days.   Meds ordered this encounter  Medications  . amoxicillin-clavulanate (AUGMENTIN) 875-125 MG tablet    Sig: Take 1 tablet by mouth 2 (two) times daily.    Dispense:  20 tablet    Refill:  0  . HYDROcodone-homatropine (HYCODAN) 5-1.5 MG/5ML syrup    Sig: Take 5 mLs by mouth every 6 (six) hours as needed for cough.    Dispense:  120 mL    Refill:  0    Laroy Apple, MD [facility] 12/29/2015, 4:22 PM

## 2016-01-16 ENCOUNTER — Ambulatory Visit (INDEPENDENT_AMBULATORY_CARE_PROVIDER_SITE_OTHER): Payer: Medicare Other | Admitting: Family Medicine

## 2016-01-16 ENCOUNTER — Encounter: Payer: Self-pay | Admitting: Family Medicine

## 2016-01-16 VITALS — BP 126/76 | HR 54 | Temp 96.9°F | Ht 66.0 in | Wt 178.0 lb

## 2016-01-16 DIAGNOSIS — I2510 Atherosclerotic heart disease of native coronary artery without angina pectoris: Secondary | ICD-10-CM

## 2016-01-16 DIAGNOSIS — I10 Essential (primary) hypertension: Secondary | ICD-10-CM | POA: Diagnosis not present

## 2016-01-16 DIAGNOSIS — Z0000 Encounter for general adult medical examination without abnormal findings: Secondary | ICD-10-CM

## 2016-01-16 DIAGNOSIS — Z23 Encounter for immunization: Secondary | ICD-10-CM | POA: Diagnosis not present

## 2016-01-16 DIAGNOSIS — R0982 Postnasal drip: Secondary | ICD-10-CM

## 2016-01-16 DIAGNOSIS — E7800 Pure hypercholesterolemia, unspecified: Secondary | ICD-10-CM | POA: Diagnosis not present

## 2016-01-16 MED ORDER — CLOPIDOGREL BISULFATE 75 MG PO TABS: 75.0000 mg | ORAL_TABLET | Freq: Every day | ORAL | 3 refills | Status: DC

## 2016-01-16 MED ORDER — EZETIMIBE-SIMVASTATIN 10-80 MG PO TABS: 0.5000 | ORAL_TABLET | Freq: Every day | ORAL | 3 refills | Status: DC

## 2016-01-16 NOTE — Progress Notes (Signed)
   HPI  Patient presents today here for six-month follow-up.  No complaints. He feels much better from his recent URI, has finishe damox but has some lingering Post nasal drip.   HTN, CAD s/p stenting in 2008 Good medication compliance, no chest pain or exertional duyspnea  HLD Tolerating 1/2 vytorin daily Watching diet moderately   BPH Does no tsee much improvement in symptoms with proscar and FLomax Hs been on proscar at least 6 months Planning to f/u with urology  PMH: Smoking status noted ROS: Per HPI  Objective: BP 126/76   Pulse (!) 54   Temp (!) 96.9 F (36.1 C) (Oral)   Ht _0  (1.676 m)   Wt 178 lb (80.7 kg)   BMI 28.73 kg/m  Gen: NAD, alert, cooperative with exam HEENT: NCAT, nares clear, oropharynx clear CV: RRR, good S1/S2, no murmur Resp: CTABL, no wheezes, non-labored Ext: No edema, warm Neuro: Alert and oriented, No gross deficits  Assessment and plan:  # HTN Well controlled on metop, no changes labs  # BPH Uncontrolled on flomax and proscar F/u with urology  # HLD Likely stable Refilled meds Labs today, LDL goal less than 70 with CAD  # post nasl drip Flonase recommended  HCM Hep c checked    Orders Placed This Encounter  Procedures  . Flu Vaccine QUAD 36+ mos IM  . CBC with Differential/Platelet  . Lipid panel  . CMP14+EGFR  . TSH  . Hepatitis C antibody    Meds ordered this encounter  Medications  . ezetimibe-simvastatin (VYTORIN) 10-80 MG tablet    Sig: Take 0.5 tablets by mouth daily.    Dispense:  45 tablet    Refill:  3  . clopidogrel (PLAVIX) 75 MG tablet    Sig: Take 1 tablet (75 mg total) by mouth daily.    Dispense:  90 tablet    Refill:  [place], MD [facility] 01/16/2016, 12:15 PM

## 2016-01-16 NOTE — Patient Instructions (Signed)
Great to see you!  Lets see you again in  6 months  We will send your labs on mychart or call within 1 week.   Consider flonase 2 sprays per nostril once daily for your post nasal drip symptoms.

## 2016-01-17 LAB — CMP14+EGFR
A/G RATIO: 1.6 (ref 1.2–2.2)
ALBUMIN: 4 g/dL (ref 3.6–4.8)
ALT: 27 IU/L (ref 0–44)
AST: 18 IU/L (ref 0–40)
Alkaline Phosphatase: 61 IU/L (ref 39–117)
BUN / CREAT RATIO: 18 (ref 10–24)
BUN: 17 mg/dL (ref 8–27)
Bilirubin Total: 0.7 mg/dL (ref 0.0–1.2)
CHLORIDE: 100 mmol/L (ref 96–106)
CO2: 27 mmol/L (ref 18–29)
Calcium: 9.1 mg/dL (ref 8.6–10.2)
Creatinine, Ser: 0.94 mg/dL (ref 0.76–1.27)
GFR calc non Af Amer: 84 mL/min/{1.73_m2} (ref >59)
GFR, EST AFRICAN AMERICAN: 97 mL/min/{1.73_m2} (ref >59)
GLUCOSE: 93 mg/dL (ref 65–99)
Globulin, Total: 2.5 g/dL (ref 1.5–4.5)
Potassium: 4.2 mmol/L (ref 3.5–5.2)
Sodium: 140 mmol/L (ref 134–144)
TOTAL PROTEIN: 6.5 g/dL (ref 6.0–8.5)

## 2016-01-17 LAB — CBC WITH DIFFERENTIAL/PLATELET
BASOS ABS: 0 10*3/uL (ref 0.0–0.2)
Basos: 0 %
EOS (ABSOLUTE): 0.1 10*3/uL (ref 0.0–0.4)
Eos: 1 %
Hematocrit: 46.5 % (ref 37.5–51.0)
Hemoglobin: 15.7 g/dL (ref 12.6–17.7)
Immature Grans (Abs): 0 10*3/uL (ref 0.0–0.1)
Immature Granulocytes: 0 %
Lymphocytes Absolute: 1.6 10*3/uL (ref 0.7–3.1)
Lymphs: 25 %
MCH: 31.3 pg (ref 26.6–33.0)
MCHC: 33.8 g/dL (ref 31.5–35.7)
MCV: 93 fL (ref 79–97)
MONOS ABS: 0.4 10*3/uL (ref 0.1–0.9)
Monocytes: 7 %
NEUTROS PCT: 67 %
Neutrophils Absolute: 4.3 10*3/uL (ref 1.4–7.0)
PLATELETS: 193 10*3/uL (ref 150–379)
RBC: 5.01 x10E6/uL (ref 4.14–5.80)
RDW: 13 % (ref 12.3–15.4)
WBC: 6.5 10*3/uL (ref 3.4–10.8)

## 2016-01-17 LAB — LIPID PANEL
CHOLESTEROL TOTAL: 158 mg/dL (ref 100–199)
Chol/HDL Ratio: 4.2 ratio units (ref 0.0–5.0)
HDL: 38 mg/dL — AB (ref >39)
LDL Calculated: 72 mg/dL (ref 0–99)
Triglycerides: 241 mg/dL — ABNORMAL HIGH (ref 0–149)
VLDL Cholesterol Cal: 48 mg/dL — ABNORMAL HIGH (ref 5–40)

## 2016-01-17 LAB — TSH: TSH: 5.24 u[IU]/mL — ABNORMAL HIGH (ref 0.450–4.500)

## 2016-01-19 LAB — HEPATITIS C ANTIBODY: Hep C Virus Ab: 0.1 s/co ratio (ref 0.0–0.9)

## 2016-01-19 LAB — SPECIMEN STATUS REPORT

## 2016-02-05 ENCOUNTER — Encounter: Payer: Self-pay | Admitting: Interventional Cardiology

## 2016-02-05 NOTE — Progress Notes (Signed)
Patient ID: Corey Duran, male   DOB: [birth year], 65 y.o.   MRN: [id number]     Cardiology Office Note   Date:  02/06/2016   ID:  Corey Duran, [name] 12/19/1950, MRN [id number]  PCP:  Kenn File, MD    No chief complaint on file. CAD   Wt Readings from Last 3 Encounters:  02/06/16 81.2 kg (179 lb)  01/16/16 80.7 kg (178 lb)  12/29/15 81.8 kg (180 lb 6.4 oz)       History of Present Illness: Corey Duran is a 65 y.o. male who has had CAD.  He had an LAD stent in 2008.  He had been on CPAP in the past but stopped it due to the inconvenience.  He sleeps better with the CPAP, but it was not worth the hassle.  He plays golf, dances for exercise.  No chest discomfort.    No shortness of breath with activity.  No sx like what he had with stent.  (Sweating, feeling cold, fatigue)  Aspirin was stopped in the past.    Vytorin was decreased to half tab daily and LDL was still 70.    He has lost some weight recently through diet control.  He has not had fatigue like 2008 recently.         Past Medical History:  Diagnosis Date  . BPH (benign prostatic hypertrophy)    followed by Dr. Reece Agar in the past; Now Dr. Diona Fanti  . Coronary atherosclerosis of native coronary artery    05/2006, stent LAD, patent 03/2007, study stent  . CTS (carpal tunnel syndrome)    1993, 2002  . GERD (gastroesophageal reflux disease)   . Hypercholesteremia   . Insomnia   . Laceration of leg    severe, 2009  . LBP (low back pain)   . OSA (obstructive sleep apnea)    PSG 07/23/10 ESS 11, AHI 16/hr REM 18/hr, RDI 17/hr REM 20/hr, O2 min 87%; CPAP 08/22/10 CPAP 10 with AHI 0  . Tenosynovitis of finger    left third finger    No past surgical history on file.   Current Outpatient Prescriptions  Medication Sig Dispense Refill  . clopidogrel (PLAVIX) 75 MG tablet Take 1 tablet (75 mg total) by mouth daily. 90 tablet 3  . Coenzyme Q10 (COQ10) 100 MG CAPS Take 100 mg by mouth daily.    Marland Kitchen  ezetimibe-simvastatin (VYTORIN) 10-80 MG tablet Take 0.5 tablets by mouth daily. 45 tablet 3  . finasteride (PROSCAR) 5 MG tablet Take 5 mg by mouth daily.    . metoprolol tartrate (LOPRESSOR) 25 MG tablet Take 25 mg by mouth 2 (two) times daily.    . Multiple Vitamins-Minerals (MULTIVITAMIN PO) Take 1 tablet by mouth daily.    . Omega-3 Fatty Acids (FISH OIL) 1200 MG CAPS Take 1,200 mg by mouth daily.     . ranitidine (ZANTAC) 75 MG tablet Take 75 mg by mouth as needed (HEARTBURN).     . tamsulosin (FLOMAX) 0.4 MG CAPS capsule Take 0.4 mg by mouth daily.     No current facility-administered medications for this visit.     Allergies:   Patient has no known allergies.    Social History:  The patient  reports that he has quit smoking. He has quit using smokeless tobacco. He reports that he drinks alcohol. He reports that he does not use drugs.   Family History:  The patient's family history includes CAD in his father; Dementia in his  mother; Heart attack in his father; Hypertension in his father; Ulcers in his father.    ROS:  Please see the history of present illness.   Otherwise, review of systems are positive for back pain- improving.   All other systems are reviewed and negative.    PHYSICAL EXAM: VS:  BP 114/72   Pulse (!) 56   Ht 5\' 6"  (1.676 m)   Wt 81.2 kg (179 lb)   BMI 28.89 kg/m  , BMI Body mass index is 28.89 kg/m. GEN: Well nourished, well developed, in no acute distress  HEENT: normal  Neck: no JVD, carotid bruits, or masses Cardiac: RRR; no murmurs, rubs, or gallops,no edema  Respiratory:  clear to auscultation bilaterally, normal work of breathing GI: soft, nontender, nondistended, + BS MS: no deformity or atrophy  Skin: warm and dry, no rash Neuro:  Strength and sensation are intact Psych: euthymic mood, full affect   EKG:   The ekg ordered today demonstrates SB, no ST segment changes   Recent Labs: 01/16/2016: ALT 27; BUN 17; Creatinine, Ser 0.94;  Platelets 193; Potassium 4.2; Sodium 140; TSH 5.240   Lipid Panel    Component Value Date/Time   CHOL 158 01/16/2016 1106   TRIG 241 (H) 01/16/2016 1106   HDL 38 (L) 01/16/2016 1106   CHOLHDL 4.2 01/16/2016 1106   CHOLHDL 4.3 01/24/2015 1147   VLDL 32 (H) 01/24/2015 1147   LDLCALC 72 01/16/2016 1106   LDLDIRECT 70.4 03/29/2014 1141     Other studies Reviewed: Additional studies/ records that were reviewed today with results demonstrating: LAD stent placed in 2008 by Dr. Leonia Reeves.   ASSESSMENT AND PLAN:  1. CAD: Had unstable angina type sx in 2008 without MI at time of his stent placement. No sx to that level recently.  He did not have chest pain at that time.     2. Hyperlipidemia: Continue current dose of Vytorin. LDL 72. TG 241- higher than target, and higher on the lower dose of Vytorin.  His diet was not as good.  His LDL may have decreased with his lifestyle changes.  He had some issues with his eyes when he was on higher dose of fish oil and aspirin.  Increase fish oil to BID.  If there are any side effects, he can go back to daily.  Labs to be checked with Dr. Wendi Snipes in 6 months.  3. Hypertension: controlled.continue current medicines. 4. Bradycardia: Unchanged.  No symptoms of bradycardia. 5. Sleep apnea: He has stopped using the C Pap on his own.  I offered for him a referral to see Dr. Maxwell Caul, his previous PMD for sleep consult. He would be able to discuss with them more convenient CPap machine. Last year, he planned on finding a new primary care doctor and getting follow-up with them regarding the sleep apnea.  He switched to Dr. Wendi Snipes.  He will discuss with PMD.   Current medicines are reviewed at length with the patient today.  The patient concerns regarding his medicines were addressed.  The following changes have been made:  No change  Labs/ tests ordered today include:  No orders of the defined types were placed in this encounter.   Recommend 150 minutes/week  of aerobic exercise Low fat, low carb, high fiber diet recommended  Disposition:   FU in 1 year   Signed, Larae Grooms, MD  02/06/2016 12:13 PM    [facility] [address], [place], [place]  [address] Phone: [phone]; Fax: [phone]

## 2016-02-06 ENCOUNTER — Ambulatory Visit (INDEPENDENT_AMBULATORY_CARE_PROVIDER_SITE_OTHER): Payer: Medicare Other | Admitting: Interventional Cardiology

## 2016-02-06 ENCOUNTER — Encounter: Payer: Self-pay | Admitting: Interventional Cardiology

## 2016-02-06 VITALS — BP 114/72 | HR 56 | Ht 66.0 in | Wt 179.0 lb

## 2016-02-06 DIAGNOSIS — I10 Essential (primary) hypertension: Secondary | ICD-10-CM | POA: Diagnosis not present

## 2016-02-06 DIAGNOSIS — I2510 Atherosclerotic heart disease of native coronary artery without angina pectoris: Secondary | ICD-10-CM | POA: Diagnosis not present

## 2016-02-06 DIAGNOSIS — E7800 Pure hypercholesterolemia, unspecified: Secondary | ICD-10-CM

## 2016-02-06 DIAGNOSIS — G4733 Obstructive sleep apnea (adult) (pediatric): Secondary | ICD-10-CM

## 2016-02-06 NOTE — Patient Instructions (Signed)
Medication Instructions:  Increase Fish Oil to twice daily. All other medications remain the same.  Labwork: None  Testing/Procedures: None  Follow-Up: Your physician wants you to follow-up in: 1 year. You will receive a reminder letter in the mail two months in advance. If you don't receive a letter, please call our office to schedule the follow-up appointment.     If you need a refill on your cardiac medications before your next appointment, please call your pharmacy.

## 2016-02-07 DIAGNOSIS — N401 Enlarged prostate with lower urinary tract symptoms: Secondary | ICD-10-CM | POA: Diagnosis not present

## 2016-02-07 DIAGNOSIS — R3915 Urgency of urination: Secondary | ICD-10-CM | POA: Diagnosis not present

## 2016-02-07 DIAGNOSIS — R972 Elevated prostate specific antigen [PSA]: Secondary | ICD-10-CM | POA: Diagnosis not present

## 2016-02-19 DIAGNOSIS — L570 Actinic keratosis: Secondary | ICD-10-CM | POA: Diagnosis not present

## 2016-02-19 DIAGNOSIS — L821 Other seborrheic keratosis: Secondary | ICD-10-CM | POA: Diagnosis not present

## 2016-02-19 DIAGNOSIS — D2272 Melanocytic nevi of left lower limb, including hip: Secondary | ICD-10-CM | POA: Diagnosis not present

## 2016-02-19 DIAGNOSIS — D235 Other benign neoplasm of skin of trunk: Secondary | ICD-10-CM | POA: Diagnosis not present

## 2016-02-19 DIAGNOSIS — D1801 Hemangioma of skin and subcutaneous tissue: Secondary | ICD-10-CM | POA: Diagnosis not present

## 2016-02-19 DIAGNOSIS — L814 Other melanin hyperpigmentation: Secondary | ICD-10-CM | POA: Diagnosis not present

## 2016-02-19 DIAGNOSIS — Z85828 Personal history of other malignant neoplasm of skin: Secondary | ICD-10-CM | POA: Diagnosis not present

## 2016-04-15 ENCOUNTER — Telehealth: Payer: Self-pay | Admitting: Family Medicine

## 2016-04-15 MED ORDER — METOPROLOL TARTRATE 25 MG PO TABS: 25.0000 mg | ORAL_TABLET | Freq: Two times a day (BID) | ORAL | 0 refills | Status: DC

## 2016-04-15 NOTE — Telephone Encounter (Signed)
Aware. Script sent to pharmacy.

## 2016-04-15 NOTE — Telephone Encounter (Signed)
Refill given on blood pressure medication.

## 2016-06-06 ENCOUNTER — Other Ambulatory Visit: Payer: Self-pay | Admitting: Family Medicine

## 2016-07-16 ENCOUNTER — Ambulatory Visit: Payer: Medicare Other | Admitting: Family Medicine

## 2016-07-23 ENCOUNTER — Ambulatory Visit (INDEPENDENT_AMBULATORY_CARE_PROVIDER_SITE_OTHER): Payer: Medicare Other | Admitting: Family Medicine

## 2016-07-23 ENCOUNTER — Encounter: Payer: Self-pay | Admitting: Family Medicine

## 2016-07-23 VITALS — BP 122/74 | HR 52 | Temp 97.2°F | Ht 66.0 in | Wt 183.6 lb

## 2016-07-23 DIAGNOSIS — E7800 Pure hypercholesterolemia, unspecified: Secondary | ICD-10-CM | POA: Diagnosis not present

## 2016-07-23 DIAGNOSIS — I10 Essential (primary) hypertension: Secondary | ICD-10-CM

## 2016-07-23 DIAGNOSIS — M75101 Unspecified rotator cuff tear or rupture of right shoulder, not specified as traumatic: Secondary | ICD-10-CM | POA: Diagnosis not present

## 2016-07-23 NOTE — Progress Notes (Signed)
   HPI  Patient presents today here for follow-up hypertension, hyperlipidemia, and new complaint of shoulder pain.  Hypertension No medications, no chest pain, dyspnea, palpitations, headache.  Hyperlipidemia Taking 0.5 tablet of Vytorin daily, also taking 1200 mg of fish oil a.m. and 500 mg omega-3 fatty acids daily. Fasting today.  Right shoulder pain 3 months, slowly worsening, now also with right neck pain. Describes difficulty stretching shoulder/arm posterior Lyrica. Patient has pain with normal range of motion. No injury. Patient has gallstone with next week would like to be ready for that. Helped by massage temporarily.  PMH: Smoking status noted ROS: Per HPI  Objective: BP 122/74   Pulse (!) 52   Temp 97.2 F (36.2 C) (Oral)   Ht 5' 6" (1.676 m)   Wt 183 lb 9.6 oz (83.3 kg)   BMI 29.63 kg/m  Gen: NAD, alert, cooperative with exam HEENT: NCAT CV: RRR, good S1/S2, no murmur Resp: CTABL, no wheezes, non-labored Ext: No edema, warm Neuro: Alert and oriented, No gross deficits  MSK:  Right shoulder with no tenderness to palpation of bony or muscular landmarks Negative empty can test, positive Hawkins sign Full range of motion with pain with abduction over 90   Assessment and plan:  # Hypertension Diet controlled, no medications needed at this time  # Hyperlipidemia, hypercholesterolemia Recheck labs, clinically stable Consider Vascepa  # Rotator cuff syndrome Right-sided, no signs of overt tear or serious injury. He does have signs of impingement. Recommended possibility of physical therapy continue massage, Tylenol as needed versus injection plus physical therapy. Versus injection align with corticosteroid. Patient will return for injection.    Orders Placed This Encounter  Procedures  . CMP14+EGFR  . Lipid panel     Laroy Apple, MD [facility] 07/23/2016, 9:31 AM

## 2016-07-23 NOTE — Patient Instructions (Signed)
Great to see you!  Come back for a shoulder injection if that is the path you would like to take.   We will call with lab results within 1 week.

## 2016-07-24 ENCOUNTER — Ambulatory Visit (INDEPENDENT_AMBULATORY_CARE_PROVIDER_SITE_OTHER): Payer: Medicare Other | Admitting: Family Medicine

## 2016-07-24 ENCOUNTER — Encounter: Payer: Self-pay | Admitting: Family Medicine

## 2016-07-24 VITALS — BP 123/67 | HR 49 | Temp 97.6°F | Ht 66.0 in | Wt 183.6 lb

## 2016-07-24 DIAGNOSIS — M75101 Unspecified rotator cuff tear or rupture of right shoulder, not specified as traumatic: Secondary | ICD-10-CM | POA: Diagnosis not present

## 2016-07-24 DIAGNOSIS — D229 Melanocytic nevi, unspecified: Secondary | ICD-10-CM

## 2016-07-24 LAB — CMP14+EGFR
ALK PHOS: 66 IU/L (ref 39–117)
ALT: 28 IU/L (ref 0–44)
AST: 19 IU/L (ref 0–40)
Albumin/Globulin Ratio: 2 (ref 1.2–2.2)
Albumin: 4.1 g/dL (ref 3.6–4.8)
BILIRUBIN TOTAL: 0.4 mg/dL (ref 0.0–1.2)
BUN/Creatinine Ratio: 20 (ref 10–24)
BUN: 18 mg/dL (ref 8–27)
CHLORIDE: 105 mmol/L (ref 96–106)
CO2: 24 mmol/L (ref 18–29)
Calcium: 8.9 mg/dL (ref 8.6–10.2)
Creatinine, Ser: 0.88 mg/dL (ref 0.76–1.27)
GFR calc Af Amer: 102 mL/min/{1.73_m2} (ref >59)
GFR calc non Af Amer: 88 mL/min/{1.73_m2} (ref >59)
Globulin, Total: 2.1 g/dL (ref 1.5–4.5)
Glucose: 102 mg/dL — ABNORMAL HIGH (ref 65–99)
Potassium: 4.1 mmol/L (ref 3.5–5.2)
Sodium: 143 mmol/L (ref 134–144)
Total Protein: 6.2 g/dL (ref 6.0–8.5)

## 2016-07-24 LAB — LIPID PANEL
Chol/HDL Ratio: 3.7 ratio (ref 0.0–5.0)
Cholesterol, Total: 131 mg/dL (ref 100–199)
HDL: 35 mg/dL — AB (ref >39)
LDL Calculated: 56 mg/dL (ref 0–99)
Triglycerides: 198 mg/dL — ABNORMAL HIGH (ref 0–149)
VLDL CHOLESTEROL CAL: 40 mg/dL (ref 5–40)

## 2016-07-24 MED ORDER — TRIAMCINOLONE ACETONIDE 40 MG/ML IJ SUSP
40.0000 mg | Freq: Once | INTRAMUSCULAR | Status: AC
  Administered 2016-07-24: 40 mg via INTRAMUSCULAR

## 2016-07-24 MED ORDER — LIDOCAINE HCL 1 % IJ SOLN
10.0000 mL | Freq: Once | INTRAMUSCULAR | Status: AC
  Administered 2016-07-24: 10 mL via INTRADERMAL

## 2016-07-24 NOTE — Progress Notes (Signed)
   HPI  Patient presents today here with shoulder pain for injection.  Patient also has concern about a mole Patient has a small mole on neck below left ear.  No irritation or bleeding, no changes + Hx of AK  L shoulder pain eval'd 1 day ago, back for injection.   PMH: Smoking status noted ROS: Per HPI  Vitals:   07/24/16 0907  BP: 123/67  Pulse: (!) 49  Temp: 97.6 F (36.4 C)    Gen: NAD, alert, cooperative with exam HEENT: NCAT Ext: No edema, warm Neuro: Alert and oriented, No gross deficits   Skin 4 mm skin nodule with flesh colored base with hyperpigmented centrally   R shoulder injection Informed consent obtained and placed in chart.  Time out performed.  Area cleaned with iodine x 2 and wiped clear with alcohol swab.  Using 22 X 1 1/2 gauge needle 1 cc 40mg /mL Kenalog and 4 cc's 1% Lidocaine were injected in subacromial space via posterior approach.  Sterile bandage placed.  Patient tolerated procedure well.  No complications.    Assessment and plan:  # Rotator cuff syndrome Provided injection today Discussed risks and benefits of injection. Return to clinic as needed  # Mole New problem, small below left ear consistent with benign mole with some atypical features. Measured carefully, follow-up at next office visit. Patient will also mentions that his dermatologist     Laroy Apple, MD [facility] 07/24/2016, 9:30 AM

## 2016-07-25 ENCOUNTER — Other Ambulatory Visit: Payer: Self-pay | Admitting: Family Medicine

## 2016-07-31 ENCOUNTER — Ambulatory Visit (INDEPENDENT_AMBULATORY_CARE_PROVIDER_SITE_OTHER): Payer: Medicare Other | Admitting: Family Medicine

## 2016-07-31 ENCOUNTER — Encounter: Payer: Self-pay | Admitting: Family Medicine

## 2016-07-31 VITALS — BP 117/67 | HR 55 | Temp 98.5°F | Ht 66.0 in | Wt 179.0 lb

## 2016-07-31 DIAGNOSIS — R21 Rash and other nonspecific skin eruption: Secondary | ICD-10-CM

## 2016-07-31 DIAGNOSIS — W57XXXA Bitten or stung by nonvenomous insect and other nonvenomous arthropods, initial encounter: Secondary | ICD-10-CM | POA: Diagnosis not present

## 2016-07-31 MED ORDER — DOXYCYCLINE HYCLATE 100 MG PO CAPS: 100.0000 mg | ORAL_CAPSULE | Freq: Two times a day (BID) | ORAL | 0 refills | Status: DC

## 2016-07-31 MED ORDER — FLUOCINONIDE-E 0.05 % EX CREA: 1.0000 "application " | TOPICAL_CREAM | Freq: Two times a day (BID) | CUTANEOUS | 5 refills | Status: DC

## 2016-07-31 NOTE — Progress Notes (Signed)
Subjective:  Patient ID: Corey Duran, male    DOB: 13-Dec-1950  Age: 66 y.o. MRN: [id number]  CC: Tick Removal (pt here today c/o itching and a "bullseye" like rash on his lower left side/back)   HPI Corey Duran presents for Bit and take 2-3 days ago. Full is off a couple of days later. He then noted a rash 2 days ago. The first time I discussed mosquito bite. Seems to be spreading. Location is lower left flank posteriorly  History Corey Duran has a past medical history of BPH (benign prostatic hypertrophy); Coronary atherosclerosis of native coronary artery; CTS (carpal tunnel syndrome); GERD (gastroesophageal reflux disease); Hypercholesteremia; Insomnia; Laceration of leg; LBP (low back pain); OSA (obstructive sleep apnea); and Tenosynovitis of finger.   He has no past surgical history on file.   His family history includes CAD in his father; Dementia in his mother; Heart attack in his father; Hypertension in his father; Ulcers in his father.He reports that he has quit smoking. He has quit using smokeless tobacco. He reports that he drinks alcohol. He reports that he does not use drugs.  Current Outpatient Prescriptions on File Prior to Visit  Medication Sig Dispense Refill  . clopidogrel (PLAVIX) 75 MG tablet Take 1 tablet (75 mg total) by mouth daily. 90 tablet 3  . Coenzyme Q10 (COQ10) 100 MG CAPS Take 100 mg by mouth daily.    Marland Kitchen ezetimibe-simvastatin (VYTORIN) 10-80 MG tablet Take 0.5 tablets by mouth daily. 45 tablet 3  . finasteride (PROSCAR) 5 MG tablet Take 5 mg by mouth daily.    . metoprolol tartrate (LOPRESSOR) 25 MG tablet TAKE 1 TABLET(25 MG) BY MOUTH TWICE DAILY 90 tablet 1  . Multiple Vitamins-Minerals (MULTIVITAMIN PO) Take 1 tablet by mouth daily.    . Omega-3 Fatty Acids (FISH OIL) 1200 MG CAPS Take 1,200 mg by mouth 2 (two) times daily.    . ranitidine (ZANTAC) 75 MG tablet Take 75 mg by mouth as needed (HEARTBURN).     . tamsulosin (FLOMAX) 0.4 MG CAPS capsule  Take 0.4 mg by mouth daily.     No current facility-administered medications on file prior to visit.     ROS Review of Systems  Constitutional: Negative for chills, diaphoresis and fever.    Objective:  BP 117/67   Pulse (!) 55   Temp 98.5 F (36.9 C) (Oral)   Ht 5\' 6"  (1.676 m)   Wt 179 lb (81.2 kg)   BMI 28.89 kg/m   Physical Exam  Skin: Skin is warm and dry. There is erythema.  Lesion pictured below is approximately 2 x 3 cm. There is a slightly clearing area at the right upper margin. Central mellitus. This is a darker red without ulceration. Circumscribed. No blanching.      Assessment & Plan:   Corey Duran was seen today for tick removal.  Diagnoses and all orders for this visit:  Tick bite, initial encounter  Other orders -     doxycycline (VIBRAMYCIN) 100 MG capsule; Take 1 capsule (100 mg total) by mouth 2 (two) times daily. -     fluocinonide-emollient (LIDEX-E) 0.05 % cream; Apply 1 application topically 2 (two) times daily. To affected areas   I am having Corey Duran start on doxycycline and fluocinonide-emollient. I am also having him maintain his Multiple Vitamins-Minerals (MULTIVITAMIN PO), Fish Oil, finasteride, tamsulosin, ranitidine, CoQ10, ezetimibe-simvastatin, clopidogrel, and metoprolol tartrate.  Meds ordered this encounter  Medications  . doxycycline (VIBRAMYCIN) 100 MG capsule  Sig: Take 1 capsule (100 mg total) by mouth 2 (two) times daily.    Dispense:  20 capsule    Refill:  0  . fluocinonide-emollient (LIDEX-E) 0.05 % cream    Sig: Apply 1 application topically 2 (two) times daily. To affected areas    Dispense:  60 g    Refill:  5     Follow-up: Return if symptoms worsen or fail to improve.  Claretta Fraise, M.D.

## 2016-08-14 DIAGNOSIS — N401 Enlarged prostate with lower urinary tract symptoms: Secondary | ICD-10-CM | POA: Diagnosis not present

## 2016-08-14 DIAGNOSIS — R350 Frequency of micturition: Secondary | ICD-10-CM | POA: Diagnosis not present

## 2016-08-14 DIAGNOSIS — R972 Elevated prostate specific antigen [PSA]: Secondary | ICD-10-CM | POA: Diagnosis not present

## 2016-09-12 ENCOUNTER — Other Ambulatory Visit: Payer: Self-pay | Admitting: Family Medicine

## 2016-11-04 ENCOUNTER — Telehealth: Payer: Self-pay | Admitting: Family Medicine

## 2016-11-04 DIAGNOSIS — S00511A Abrasion of lip, initial encounter: Secondary | ICD-10-CM | POA: Diagnosis not present

## 2016-11-04 DIAGNOSIS — S40011A Contusion of right shoulder, initial encounter: Secondary | ICD-10-CM | POA: Diagnosis not present
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
## 2016-11-04 NOTE — Telephone Encounter (Signed)
Call given to dr Wendi Snipes

## 2016-11-18 ENCOUNTER — Ambulatory Visit: Payer: Medicare Other | Admitting: Family Medicine

## 2016-11-19 ENCOUNTER — Encounter: Payer: Self-pay | Admitting: Family Medicine

## 2016-11-19 ENCOUNTER — Ambulatory Visit (INDEPENDENT_AMBULATORY_CARE_PROVIDER_SITE_OTHER): Payer: Medicare Other | Admitting: Family Medicine

## 2016-11-19 VITALS — BP 126/83 | HR 60 | Temp 96.7°F | Ht 66.0 in | Wt 183.0 lb

## 2016-11-19 DIAGNOSIS — M75101 Unspecified rotator cuff tear or rupture of right shoulder, not specified as traumatic: Secondary | ICD-10-CM | POA: Diagnosis not present
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
## 2016-11-27 DIAGNOSIS — M25511 Pain in right shoulder: Secondary | ICD-10-CM | POA: Diagnosis not present

## 2016-12-03 DIAGNOSIS — M25511 Pain in right shoulder: Secondary | ICD-10-CM | POA: Diagnosis not present

## 2016-12-06 DIAGNOSIS — M75121 Complete rotator cuff tear or rupture of right shoulder, not specified as traumatic: Secondary | ICD-10-CM | POA: Diagnosis not present

## 2016-12-11 ENCOUNTER — Telehealth: Payer: Self-pay

## 2016-12-11 ENCOUNTER — Telehealth: Payer: Self-pay | Admitting: Interventional Cardiology

## 2016-12-11 NOTE — Telephone Encounter (Signed)
New message     [facility] Pre-operative Risk Assessment    Request for surgical clearance:  1. What type of surgery is being performed? Left shoulder scope  2. When is this surgery scheduled? 9/18  3. Are there any medications that need to be held prior to surgery and how long? plavix TBD  4. Name of physician performing surgery? Dr. Tania Ade   5. What is your office phone and fax number? [phone] fax [phone]  Tamera Reason 12/11/2016, 9:33 AM  _________________________________________________________________   (provider comments below)

## 2016-12-11 NOTE — Telephone Encounter (Signed)
Clearance routed via EPIC to Dr. Bettina Gavia office.

## 2016-12-11 NOTE — Telephone Encounter (Signed)
SENT NOTES TO SCHEDULING 

## 2016-12-11 NOTE — Telephone Encounter (Signed)
OK to hold Plavix 5 days prior to procedure.  No further cardiac testing needed before surgery.

## 2016-12-17 ENCOUNTER — Telehealth: Payer: Self-pay

## 2016-12-17 DIAGNOSIS — M7521 Bicipital tendinitis, right shoulder: Secondary | ICD-10-CM | POA: Diagnosis not present

## 2016-12-17 DIAGNOSIS — S46011S Strain of muscle(s) and tendon(s) of the rotator cuff of right shoulder, sequela: Secondary | ICD-10-CM | POA: Diagnosis not present

## 2016-12-17 DIAGNOSIS — G8918 Other acute postprocedural pain: Secondary | ICD-10-CM | POA: Diagnosis not present

## 2016-12-17 DIAGNOSIS — S46011A Strain of muscle(s) and tendon(s) of the rotator cuff of right shoulder, initial encounter: Secondary | ICD-10-CM | POA: Diagnosis not present

## 2016-12-17 DIAGNOSIS — M7541 Impingement syndrome of right shoulder: Secondary | ICD-10-CM | POA: Diagnosis not present

## 2016-12-17 NOTE — Telephone Encounter (Signed)
FYI- had shoulder surgery today with Dr. Tamera Punt and is doing well.

## 2016-12-24 DIAGNOSIS — M25511 Pain in right shoulder: Secondary | ICD-10-CM | POA: Diagnosis not present

## 2016-12-24 DIAGNOSIS — M75121 Complete rotator cuff tear or rupture of right shoulder, not specified as traumatic: Secondary | ICD-10-CM | POA: Diagnosis not present

## 2016-12-26 DIAGNOSIS — M75121 Complete rotator cuff tear or rupture of right shoulder, not specified as traumatic: Secondary | ICD-10-CM | POA: Diagnosis not present

## 2016-12-26 DIAGNOSIS — M25511 Pain in right shoulder: Secondary | ICD-10-CM | POA: Diagnosis not present

## 2016-12-30 DIAGNOSIS — M75121 Complete rotator cuff tear or rupture of right shoulder, not specified as traumatic: Secondary | ICD-10-CM | POA: Diagnosis not present

## 2016-12-30 DIAGNOSIS — M25511 Pain in right shoulder: Secondary | ICD-10-CM | POA: Diagnosis not present

## 2017-01-01 DIAGNOSIS — M75121 Complete rotator cuff tear or rupture of right shoulder, not specified as traumatic: Secondary | ICD-10-CM | POA: Diagnosis not present

## 2017-01-01 DIAGNOSIS — M25511 Pain in right shoulder: Secondary | ICD-10-CM | POA: Diagnosis not present

## 2017-01-06 DIAGNOSIS — M25511 Pain in right shoulder: Secondary | ICD-10-CM | POA: Diagnosis not present

## 2017-01-06 DIAGNOSIS — M75121 Complete rotator cuff tear or rupture of right shoulder, not specified as traumatic: Secondary | ICD-10-CM | POA: Diagnosis not present

## 2017-01-08 DIAGNOSIS — M75121 Complete rotator cuff tear or rupture of right shoulder, not specified as traumatic: Secondary | ICD-10-CM | POA: Diagnosis not present

## 2017-01-08 DIAGNOSIS — M25511 Pain in right shoulder: Secondary | ICD-10-CM | POA: Diagnosis not present

## 2017-01-13 DIAGNOSIS — M25511 Pain in right shoulder: Secondary | ICD-10-CM | POA: Diagnosis not present

## 2017-01-13 DIAGNOSIS — M75121 Complete rotator cuff tear or rupture of right shoulder, not specified as traumatic: Secondary | ICD-10-CM | POA: Diagnosis not present

## 2017-01-16 DIAGNOSIS — M25511 Pain in right shoulder: Secondary | ICD-10-CM | POA: Diagnosis not present

## 2017-01-16 DIAGNOSIS — M75121 Complete rotator cuff tear or rupture of right shoulder, not specified as traumatic: Secondary | ICD-10-CM | POA: Diagnosis not present

## 2017-01-18 ENCOUNTER — Other Ambulatory Visit: Payer: Self-pay | Admitting: Family Medicine

## 2017-01-20 DIAGNOSIS — M25511 Pain in right shoulder: Secondary | ICD-10-CM | POA: Diagnosis not present

## 2017-01-20 DIAGNOSIS — M75121 Complete rotator cuff tear or rupture of right shoulder, not specified as traumatic: Secondary | ICD-10-CM | POA: Diagnosis not present

## 2017-01-22 DIAGNOSIS — M25511 Pain in right shoulder: Secondary | ICD-10-CM | POA: Diagnosis not present

## 2017-01-22 DIAGNOSIS — M75121 Complete rotator cuff tear or rupture of right shoulder, not specified as traumatic: Secondary | ICD-10-CM | POA: Diagnosis not present

## 2017-01-23 ENCOUNTER — Encounter: Payer: Self-pay | Admitting: Family Medicine

## 2017-01-23 ENCOUNTER — Ambulatory Visit (INDEPENDENT_AMBULATORY_CARE_PROVIDER_SITE_OTHER): Payer: Medicare Other | Admitting: Family Medicine

## 2017-01-23 ENCOUNTER — Other Ambulatory Visit: Payer: Self-pay | Admitting: Family Medicine

## 2017-01-23 ENCOUNTER — Ambulatory Visit: Payer: Medicare Other | Admitting: Family Medicine

## 2017-01-23 VITALS — BP 136/85 | HR 54 | Temp 97.0°F | Ht 66.0 in | Wt 180.8 lb

## 2017-01-23 DIAGNOSIS — M5442 Lumbago with sciatica, left side: Principal | ICD-10-CM

## 2017-01-23 DIAGNOSIS — I10 Essential (primary) hypertension: Secondary | ICD-10-CM | POA: Diagnosis not present

## 2017-01-23 DIAGNOSIS — Z0000 Encounter for general adult medical examination without abnormal findings: Secondary | ICD-10-CM | POA: Diagnosis not present

## 2017-01-23 DIAGNOSIS — R7301 Impaired fasting glucose: Secondary | ICD-10-CM

## 2017-01-23 DIAGNOSIS — Z23 Encounter for immunization: Secondary | ICD-10-CM

## 2017-01-23 DIAGNOSIS — E7800 Pure hypercholesterolemia, unspecified: Secondary | ICD-10-CM

## 2017-01-23 DIAGNOSIS — G8929 Other chronic pain: Secondary | ICD-10-CM

## 2017-01-23 LAB — CBC WITH DIFFERENTIAL/PLATELET
BASOS ABS: 0 10*3/uL (ref 0.0–0.2)
Basos: 0 %
EOS (ABSOLUTE): 0.1 10*3/uL (ref 0.0–0.4)
Eos: 2 %
Hematocrit: 44.3 % (ref 37.5–51.0)
Hemoglobin: 15.2 g/dL (ref 13.0–17.7)
Immature Grans (Abs): 0 10*3/uL (ref 0.0–0.1)
Immature Granulocytes: 0 %
LYMPHS ABS: 1.7 10*3/uL (ref 0.7–3.1)
Lymphs: 29 %
MCH: 31.2 pg (ref 26.6–33.0)
MCHC: 34.3 g/dL (ref 31.5–35.7)
MCV: 91 fL (ref 79–97)
Monocytes Absolute: 0.3 10*3/uL (ref 0.1–0.9)
Monocytes: 6 %
Neutrophils Absolute: 3.7 10*3/uL (ref 1.4–7.0)
Neutrophils: 63 %
Platelets: 163 10*3/uL (ref 150–379)
RBC: 4.87 x10E6/uL (ref 4.14–5.80)
RDW: 13 % (ref 12.3–15.4)
WBC: 5.9 10*3/uL (ref 3.4–10.8)

## 2017-01-23 LAB — LIPID PANEL
Chol/HDL Ratio: 4 ratio (ref 0.0–5.0)
Cholesterol, Total: 140 mg/dL (ref 100–199)
HDL: 35 mg/dL — AB (ref >39)
LDL Calculated: 65 mg/dL (ref 0–99)
Triglycerides: 198 mg/dL — ABNORMAL HIGH (ref 0–149)
VLDL Cholesterol Cal: 40 mg/dL (ref 5–40)

## 2017-01-23 LAB — CMP14+EGFR
A/G RATIO: 1.9 (ref 1.2–2.2)
ALT: 19 IU/L (ref 0–44)
AST: 16 IU/L (ref 0–40)
Albumin: 4.2 g/dL (ref 3.6–4.8)
Alkaline Phosphatase: 67 IU/L (ref 39–117)
BILIRUBIN TOTAL: 0.5 mg/dL (ref 0.0–1.2)
BUN / CREAT RATIO: 23 (ref 10–24)
BUN: 19 mg/dL (ref 8–27)
CHLORIDE: 104 mmol/L (ref 96–106)
CO2: 22 mmol/L (ref 20–29)
Calcium: 9 mg/dL (ref 8.6–10.2)
Creatinine, Ser: 0.82 mg/dL (ref 0.76–1.27)
GFR calc non Af Amer: 91 mL/min/{1.73_m2} (ref >59)
GFR, EST AFRICAN AMERICAN: 105 mL/min/{1.73_m2} (ref >59)
GLOBULIN, TOTAL: 2.2 g/dL (ref 1.5–4.5)
Glucose: 98 mg/dL (ref 65–99)
POTASSIUM: 4.3 mmol/L (ref 3.5–5.2)
SODIUM: 141 mmol/L (ref 134–144)
TOTAL PROTEIN: 6.4 g/dL (ref 6.0–8.5)

## 2017-01-23 LAB — BAYER DCA HB A1C WAIVED: HB A1C: 5.5 % (ref <7.0)

## 2017-01-23 MED ORDER — TIZANIDINE HCL 4 MG PO TABS: 4.0000 mg | ORAL_TABLET | Freq: Four times a day (QID) | ORAL | 0 refills | Status: DC | PRN

## 2017-01-23 MED ORDER — TRAMADOL HCL 50 MG PO TABS: 50.0000 mg | ORAL_TABLET | Freq: Three times a day (TID) | ORAL | 0 refills | Status: DC | PRN

## 2017-01-23 NOTE — Patient Instructions (Signed)
Great to see you!  Come back in 6 months unless you need us sooner.    

## 2017-01-23 NOTE — Progress Notes (Signed)
   HPI  Patient presents today for an annual physical exam and to follow-up for chronic medical conditions.  Hypertension Good medication compliance.   elevated fasting glucose Watching his diet well. Recently very active, now not as active after his shoulder surgery  Lipidemia Watching diet well, activity level has decreased.  Low back pain With decrease in activity and unable to get a massage patients have more problems with chronic back pain.  He requests refill of tramadol and muscle relaxers.   PMH: Smoking status noted ROS: Per HPI  Objective: BP 136/85   Pulse (!) 54   Temp (!) 97 F (36.1 C) (Oral)   Ht '5\' 6"'$  (1.676 m)   Wt 180 lb 12.8 oz (82 kg)   BMI 29.18 kg/m  Gen: NAD, alert, cooperative with exam HEENT: NCAT, EOMI, PERRL, TMs obscured by cerumen bilaterally, oropharynx moist, nares clear CV: RRR, good S1/S2, no murmur Resp: CTABL, no wheezes, non-labored Abd: SNTND, BS present, no guarding or organomegaly Ext: No edema, warm Neuro: Alert and oriented, No gross deficits  Assessment and plan:  #Annual physical exam Normal exam, weight slightly up, however given age no change. Influenza vaccine given today, will give Prevnar next visit, avoided today given that he is still in a shoulder sling from surgery.  #Hypertension Well-controlled No changes Labs  #Elevated fasting glucose Checking for prediabetes, he is watching his diet  #Hyperlipidemia Continue Vytorin, clinically stable Labs  Chronic low back pain Exacerbated due to inactivity, patient is eager to get back to exercising and walking as well as massage is Given tramadol as well as tizanidine.  Need For influenza vaccine-counseling provided for all vaccine components    Orders Placed This Encounter  Procedures  . CBC with Differential/Platelet  . CMP14+EGFR  . Lipid panel  . Bayer DCA Hb A1c Waived    Meds ordered this encounter  Medications  . traMADol (ULTRAM) 50 MG tablet     Sig: Take 1 tablet (50 mg total) by mouth every 8 (eight) hours as needed.    Dispense:  30 tablet    Refill:  0  . tiZANidine (ZANAFLEX) 4 MG tablet    Sig: Take 1 tablet (4 mg total) by mouth every 6 (six) hours as needed for muscle spasms.    Dispense:  30 tablet    Refill:  0    Laroy Apple, MD [facility] 01/23/2017, 10:25 AM

## 2017-01-27 DIAGNOSIS — M25511 Pain in right shoulder: Secondary | ICD-10-CM | POA: Diagnosis not present

## 2017-01-27 DIAGNOSIS — M75121 Complete rotator cuff tear or rupture of right shoulder, not specified as traumatic: Secondary | ICD-10-CM | POA: Diagnosis not present

## 2017-01-29 DIAGNOSIS — M75121 Complete rotator cuff tear or rupture of right shoulder, not specified as traumatic: Secondary | ICD-10-CM | POA: Diagnosis not present

## 2017-01-29 DIAGNOSIS — M25511 Pain in right shoulder: Secondary | ICD-10-CM | POA: Diagnosis not present

## 2017-02-03 DIAGNOSIS — M25511 Pain in right shoulder: Secondary | ICD-10-CM | POA: Diagnosis not present

## 2017-02-03 DIAGNOSIS — M75121 Complete rotator cuff tear or rupture of right shoulder, not specified as traumatic: Secondary | ICD-10-CM | POA: Diagnosis not present

## 2017-02-05 DIAGNOSIS — M25511 Pain in right shoulder: Secondary | ICD-10-CM | POA: Diagnosis not present

## 2017-02-05 DIAGNOSIS — M75121 Complete rotator cuff tear or rupture of right shoulder, not specified as traumatic: Secondary | ICD-10-CM | POA: Diagnosis not present
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
## 2017-02-07 ENCOUNTER — Encounter: Payer: Self-pay | Admitting: Interventional Cardiology

## 2017-02-07 ENCOUNTER — Ambulatory Visit: Payer: Medicare Other | Admitting: Interventional Cardiology

## 2017-02-07 VITALS — BP 122/82 | HR 50 | Ht 66.0 in | Wt 181.2 lb

## 2017-02-07 DIAGNOSIS — E7800 Pure hypercholesterolemia, unspecified: Secondary | ICD-10-CM

## 2017-02-07 DIAGNOSIS — I2510 Atherosclerotic heart disease of native coronary artery without angina pectoris: Secondary | ICD-10-CM | POA: Diagnosis not present

## 2017-02-07 DIAGNOSIS — I10 Essential (primary) hypertension: Secondary | ICD-10-CM | POA: Diagnosis not present

## 2017-02-07 DIAGNOSIS — G4733 Obstructive sleep apnea (adult) (pediatric): Secondary | ICD-10-CM | POA: Diagnosis not present

## 2017-02-07 MED ORDER — NITROGLYCERIN 0.4 MG SL SUBL
0.4000 mg | SUBLINGUAL_TABLET | SUBLINGUAL | 3 refills | Status: DC | PRN
Start: 2017-02-07 — End: 2019-04-06

## 2017-02-07 NOTE — Patient Instructions (Addendum)
Medication Instructions:  Your physician recommends that you continue on your current medications as directed. Please refer to the Current Medication list given to you today.  Sublingual Nitroglycerin as needed for chest pain: Use as directed  Labwork: None ordered  Testing/Procedures: None ordered  Follow-Up: Your physician wants you to follow-up in: 1 year with Dr. Irish Lack. You will receive a reminder letter in the mail two months in advance. If you don't receive a letter, please call our office to schedule the follow-up appointment.   Any Other Special Instructions Will Be Listed Below (If Applicable).     If you need a refill on your cardiac medications before your next appointment, please call your pharmacy.

## 2017-02-10 DIAGNOSIS — M75121 Complete rotator cuff tear or rupture of right shoulder, not specified as traumatic: Secondary | ICD-10-CM | POA: Diagnosis not present

## 2017-02-10 DIAGNOSIS — M25511 Pain in right shoulder: Secondary | ICD-10-CM | POA: Diagnosis not present

## 2017-02-12 DIAGNOSIS — M25511 Pain in right shoulder: Secondary | ICD-10-CM | POA: Diagnosis not present

## 2017-02-12 DIAGNOSIS — M75121 Complete rotator cuff tear or rupture of right shoulder, not specified as traumatic: Secondary | ICD-10-CM | POA: Diagnosis not present

## 2017-02-18 DIAGNOSIS — D1801 Hemangioma of skin and subcutaneous tissue: Secondary | ICD-10-CM | POA: Diagnosis not present

## 2017-02-18 DIAGNOSIS — Z85828 Personal history of other malignant neoplasm of skin: Secondary | ICD-10-CM | POA: Diagnosis not present

## 2017-02-18 DIAGNOSIS — D225 Melanocytic nevi of trunk: Secondary | ICD-10-CM | POA: Diagnosis not present

## 2017-02-18 DIAGNOSIS — C4441 Basal cell carcinoma of skin of scalp and neck: Secondary | ICD-10-CM | POA: Diagnosis not present

## 2017-02-18 DIAGNOSIS — L821 Other seborrheic keratosis: Secondary | ICD-10-CM | POA: Diagnosis not present

## 2017-02-25 DIAGNOSIS — M75121 Complete rotator cuff tear or rupture of right shoulder, not specified as traumatic: Secondary | ICD-10-CM | POA: Diagnosis not present

## 2017-02-25 DIAGNOSIS — M25511 Pain in right shoulder: Secondary | ICD-10-CM | POA: Diagnosis not present

## 2017-02-27 DIAGNOSIS — M75121 Complete rotator cuff tear or rupture of right shoulder, not specified as traumatic: Secondary | ICD-10-CM | POA: Diagnosis not present

## 2017-02-27 DIAGNOSIS — M25511 Pain in right shoulder: Secondary | ICD-10-CM | POA: Diagnosis not present

## 2017-03-07 DIAGNOSIS — M75121 Complete rotator cuff tear or rupture of right shoulder, not specified as traumatic: Secondary | ICD-10-CM | POA: Diagnosis not present

## 2017-03-07 DIAGNOSIS — M25511 Pain in right shoulder: Secondary | ICD-10-CM | POA: Diagnosis not present

## 2017-03-12 DIAGNOSIS — M75121 Complete rotator cuff tear or rupture of right shoulder, not specified as traumatic: Secondary | ICD-10-CM | POA: Diagnosis not present

## 2017-03-12 DIAGNOSIS — M25511 Pain in right shoulder: Secondary | ICD-10-CM | POA: Diagnosis not present

## 2017-03-13 ENCOUNTER — Other Ambulatory Visit: Payer: Self-pay | Admitting: Family Medicine

## 2017-03-17 DIAGNOSIS — M25511 Pain in right shoulder: Secondary | ICD-10-CM | POA: Diagnosis not present

## 2017-03-17 DIAGNOSIS — M75121 Complete rotator cuff tear or rupture of right shoulder, not specified as traumatic: Secondary | ICD-10-CM | POA: Diagnosis not present

## 2017-03-19 DIAGNOSIS — M25511 Pain in right shoulder: Secondary | ICD-10-CM | POA: Diagnosis not present

## 2017-03-19 DIAGNOSIS — M75121 Complete rotator cuff tear or rupture of right shoulder, not specified as traumatic: Secondary | ICD-10-CM | POA: Diagnosis not present

## 2017-04-02 DIAGNOSIS — M75121 Complete rotator cuff tear or rupture of right shoulder, not specified as traumatic: Secondary | ICD-10-CM | POA: Diagnosis not present

## 2017-04-02 DIAGNOSIS — M25511 Pain in right shoulder: Secondary | ICD-10-CM | POA: Diagnosis not present

## 2017-04-09 DIAGNOSIS — M75121 Complete rotator cuff tear or rupture of right shoulder, not specified as traumatic: Secondary | ICD-10-CM | POA: Diagnosis not present

## 2017-04-09 DIAGNOSIS — M25511 Pain in right shoulder: Secondary | ICD-10-CM | POA: Diagnosis not present

## 2017-04-14 DIAGNOSIS — M75121 Complete rotator cuff tear or rupture of right shoulder, not specified as traumatic: Secondary | ICD-10-CM | POA: Diagnosis not present

## 2017-04-14 DIAGNOSIS — M25511 Pain in right shoulder: Secondary | ICD-10-CM | POA: Diagnosis not present

## 2017-04-16 DIAGNOSIS — M25511 Pain in right shoulder: Secondary | ICD-10-CM | POA: Diagnosis not present

## 2017-04-16 DIAGNOSIS — M75121 Complete rotator cuff tear or rupture of right shoulder, not specified as traumatic: Secondary | ICD-10-CM | POA: Diagnosis not present

## 2017-04-18 ENCOUNTER — Other Ambulatory Visit: Payer: Self-pay | Admitting: Family Medicine

## 2017-04-18 DIAGNOSIS — M4692 Unspecified inflammatory spondylopathy, cervical region: Secondary | ICD-10-CM | POA: Diagnosis not present

## 2017-04-21 DIAGNOSIS — M25511 Pain in right shoulder: Secondary | ICD-10-CM | POA: Diagnosis not present

## 2017-04-21 DIAGNOSIS — M75121 Complete rotator cuff tear or rupture of right shoulder, not specified as traumatic: Secondary | ICD-10-CM | POA: Diagnosis not present

## 2017-04-21 DIAGNOSIS — R7301 Impaired fasting glucose: Secondary | ICD-10-CM | POA: Diagnosis not present

## 2017-04-23 DIAGNOSIS — M47812 Spondylosis without myelopathy or radiculopathy, cervical region: Secondary | ICD-10-CM | POA: Diagnosis not present

## 2017-04-23 DIAGNOSIS — M542 Cervicalgia: Secondary | ICD-10-CM | POA: Diagnosis not present

## 2017-04-28 DIAGNOSIS — M542 Cervicalgia: Secondary | ICD-10-CM | POA: Diagnosis not present

## 2017-04-28 DIAGNOSIS — Z96611 Presence of right artificial shoulder joint: Secondary | ICD-10-CM | POA: Diagnosis not present

## 2017-04-28 DIAGNOSIS — Z09 Encounter for follow-up examination after completed treatment for conditions other than malignant neoplasm: Secondary | ICD-10-CM | POA: Diagnosis not present

## 2017-04-28 DIAGNOSIS — M47812 Spondylosis without myelopathy or radiculopathy, cervical region: Secondary | ICD-10-CM | POA: Diagnosis not present

## 2017-04-30 DIAGNOSIS — M542 Cervicalgia: Secondary | ICD-10-CM | POA: Diagnosis not present

## 2017-04-30 DIAGNOSIS — M47812 Spondylosis without myelopathy or radiculopathy, cervical region: Secondary | ICD-10-CM | POA: Diagnosis not present

## 2017-05-05 DIAGNOSIS — M47812 Spondylosis without myelopathy or radiculopathy, cervical region: Secondary | ICD-10-CM | POA: Diagnosis not present

## 2017-05-05 DIAGNOSIS — M542 Cervicalgia: Secondary | ICD-10-CM | POA: Diagnosis not present

## 2017-05-07 DIAGNOSIS — M47812 Spondylosis without myelopathy or radiculopathy, cervical region: Secondary | ICD-10-CM | POA: Diagnosis not present

## 2017-05-07 DIAGNOSIS — M542 Cervicalgia: Secondary | ICD-10-CM | POA: Diagnosis not present

## 2017-05-12 DIAGNOSIS — M542 Cervicalgia: Secondary | ICD-10-CM | POA: Diagnosis not present

## 2017-05-12 DIAGNOSIS — M47812 Spondylosis without myelopathy or radiculopathy, cervical region: Secondary | ICD-10-CM | POA: Diagnosis not present

## 2017-05-14 DIAGNOSIS — M542 Cervicalgia: Secondary | ICD-10-CM | POA: Diagnosis not present

## 2017-05-14 DIAGNOSIS — M47812 Spondylosis without myelopathy or radiculopathy, cervical region: Secondary | ICD-10-CM | POA: Diagnosis not present

## 2017-05-21 DIAGNOSIS — M47812 Spondylosis without myelopathy or radiculopathy, cervical region: Secondary | ICD-10-CM | POA: Diagnosis not present

## 2017-05-21 DIAGNOSIS — M542 Cervicalgia: Secondary | ICD-10-CM | POA: Diagnosis not present

## 2017-05-28 ENCOUNTER — Other Ambulatory Visit: Payer: Self-pay | Admitting: Family Medicine

## 2017-05-30 DIAGNOSIS — Z85828 Personal history of other malignant neoplasm of skin: Secondary | ICD-10-CM | POA: Diagnosis not present

## 2017-05-30 DIAGNOSIS — S20462A Insect bite (nonvenomous) of left back wall of thorax, initial encounter: Secondary | ICD-10-CM | POA: Diagnosis not present

## 2017-05-30 DIAGNOSIS — L821 Other seborrheic keratosis: Secondary | ICD-10-CM | POA: Diagnosis not present

## 2017-05-30 DIAGNOSIS — L570 Actinic keratosis: Secondary | ICD-10-CM | POA: Diagnosis not present

## 2017-05-30 DIAGNOSIS — L820 Inflamed seborrheic keratosis: Secondary | ICD-10-CM | POA: Diagnosis not present

## 2017-06-06 ENCOUNTER — Encounter: Payer: Self-pay | Admitting: *Deleted

## 2017-06-09 DIAGNOSIS — M25511 Pain in right shoulder: Secondary | ICD-10-CM | POA: Diagnosis not present

## 2017-06-28 ENCOUNTER — Other Ambulatory Visit: Payer: Self-pay | Admitting: Family Medicine

## 2017-07-19 ENCOUNTER — Other Ambulatory Visit: Payer: Self-pay | Admitting: Family Medicine

## 2017-07-21 NOTE — Telephone Encounter (Signed)
Ov 07/29/17

## 2017-07-24 ENCOUNTER — Ambulatory Visit: Payer: Medicare Other | Admitting: Family Medicine

## 2017-07-25 ENCOUNTER — Ambulatory Visit: Payer: Medicare Other | Admitting: Family Medicine

## 2017-07-29 ENCOUNTER — Encounter: Payer: Self-pay | Admitting: Family Medicine

## 2017-07-29 ENCOUNTER — Ambulatory Visit: Payer: Medicare Other | Admitting: Family Medicine

## 2017-07-29 ENCOUNTER — Ambulatory Visit (INDEPENDENT_AMBULATORY_CARE_PROVIDER_SITE_OTHER): Payer: Medicare Other | Admitting: Family Medicine

## 2017-07-29 VITALS — BP 121/78 | HR 60 | Temp 98.0°F | Ht 66.0 in | Wt 178.0 lb

## 2017-07-29 DIAGNOSIS — A6000 Herpesviral infection of urogenital system, unspecified: Secondary | ICD-10-CM | POA: Diagnosis not present

## 2017-07-29 DIAGNOSIS — I2510 Atherosclerotic heart disease of native coronary artery without angina pectoris: Secondary | ICD-10-CM | POA: Diagnosis not present

## 2017-07-29 DIAGNOSIS — E7800 Pure hypercholesterolemia, unspecified: Secondary | ICD-10-CM

## 2017-07-29 DIAGNOSIS — I10 Essential (primary) hypertension: Secondary | ICD-10-CM | POA: Diagnosis not present

## 2017-07-29 MED ORDER — VALACYCLOVIR HCL 1 G PO TABS: 1000.0000 mg | ORAL_TABLET | Freq: Every day | ORAL | 1 refills | Status: DC

## 2017-07-29 NOTE — Progress Notes (Signed)
   HPI  Patient presents today for follow-up chronic medical conditions.  Patient feels well and has no complaints. He does state that he has had long-term genital herpes, uses Valtrex as needed for this, he needs a refill Typically takes this daily for a few days with flares and it works very well. It was previously supplied by urology, however he has not been seen in over a year there.  Patient is fasting Good medication compliance and tolerance with Vytorin.  Patient has history of CAD-no chest pain, status post stenting. Still taking Plavix.   PMH: Smoking status noted ROS: Per HPI  Objective: BP 121/78   Pulse 60   Temp 98 F (36.7 C) (Oral)   Ht '5\' 6"'$  (1.676 m)   Wt 178 lb (80.7 kg)   BMI 28.73 kg/m  Gen: NAD, alert, cooperative with exam HEENT: NCAT CV: RRR, good S1/S2, no murmur Resp: CTABL, no wheezes, non-labored Ext: No edema, warm Neuro: Alert and oriented, No gross deficits  Assessment and plan:  #Hypertension Well-controlled Continue Toprol  #CAD No chest pain Continue Toprol and Plavix  #Genital herpes Given Valtrex, no active lesions  #Hyperlipidemia Labs today, continue Vytorin 10/80    Orders Placed This Encounter  Procedures  . CMP14+EGFR  . CBC with Differential/Platelet  . Lipid panel  . TSH    Meds ordered this encounter  Medications  . valACYclovir (VALTREX) 1000 MG tablet    Sig: Take 1 tablet (1,000 mg total) by mouth daily.    Dispense:  90 tablet    Refill:  [place], MD [facility] 07/29/2017, 5:10 PM

## 2017-07-29 NOTE — Patient Instructions (Signed)
Great to see you!  Call Garret Reddish at [facility] for an appointment in 6 months

## 2017-07-30 LAB — CMP14+EGFR
ALK PHOS: 62 IU/L (ref 39–117)
ALT: 28 IU/L (ref 0–44)
AST: 21 IU/L (ref 0–40)
Albumin/Globulin Ratio: 2 (ref 1.2–2.2)
Albumin: 4.3 g/dL (ref 3.6–4.8)
BUN/Creatinine Ratio: 16 (ref 10–24)
BUN: 17 mg/dL (ref 8–27)
Bilirubin Total: 0.8 mg/dL (ref 0.0–1.2)
CO2: 26 mmol/L (ref 20–29)
CREATININE: 1.05 mg/dL (ref 0.76–1.27)
Calcium: 9.3 mg/dL (ref 8.6–10.2)
Chloride: 103 mmol/L (ref 96–106)
GFR calc Af Amer: 83 mL/min/{1.73_m2} (ref >59)
GFR calc non Af Amer: 72 mL/min/{1.73_m2} (ref >59)
GLOBULIN, TOTAL: 2.2 g/dL (ref 1.5–4.5)
Glucose: 85 mg/dL (ref 65–99)
POTASSIUM: 4.1 mmol/L (ref 3.5–5.2)
Sodium: 142 mmol/L (ref 134–144)
Total Protein: 6.5 g/dL (ref 6.0–8.5)

## 2017-07-30 LAB — LIPID PANEL
CHOLESTEROL TOTAL: 148 mg/dL (ref 100–199)
Chol/HDL Ratio: 3.7 ratio (ref 0.0–5.0)
HDL: 40 mg/dL (ref >39)
LDL Calculated: 76 mg/dL (ref 0–99)
TRIGLYCERIDES: 160 mg/dL — AB (ref 0–149)
VLDL Cholesterol Cal: 32 mg/dL (ref 5–40)

## 2017-07-30 LAB — CBC WITH DIFFERENTIAL/PLATELET
BASOS ABS: 0 10*3/uL (ref 0.0–0.2)
Basos: 0 %
EOS (ABSOLUTE): 0.1 10*3/uL (ref 0.0–0.4)
Eos: 1 %
Hematocrit: 46.4 % (ref 37.5–51.0)
Hemoglobin: 15.8 g/dL (ref 13.0–17.7)
IMMATURE GRANULOCYTES: 0 %
Immature Grans (Abs): 0 10*3/uL (ref 0.0–0.1)
LYMPHS: 25 %
Lymphocytes Absolute: 1.9 10*3/uL (ref 0.7–3.1)
MCH: 31.5 pg (ref 26.6–33.0)
MCHC: 34.1 g/dL (ref 31.5–35.7)
MCV: 93 fL (ref 79–97)
Monocytes Absolute: 0.4 10*3/uL (ref 0.1–0.9)
Monocytes: 5 %
NEUTROS PCT: 69 %
Neutrophils Absolute: 5.4 10*3/uL (ref 1.4–7.0)
PLATELETS: 187 10*3/uL (ref 150–379)
RBC: 5.01 x10E6/uL (ref 4.14–5.80)
RDW: 13.6 % (ref 12.3–15.4)
WBC: 7.8 10*3/uL (ref 3.4–10.8)

## 2017-07-30 LAB — TSH: TSH: 5.19 u[IU]/mL — AB (ref 0.450–4.500)

## 2017-08-08 DIAGNOSIS — N401 Enlarged prostate with lower urinary tract symptoms: Secondary | ICD-10-CM | POA: Diagnosis not present

## 2017-08-08 DIAGNOSIS — N5201 Erectile dysfunction due to arterial insufficiency: Secondary | ICD-10-CM | POA: Diagnosis not present

## 2017-08-08 DIAGNOSIS — R972 Elevated prostate specific antigen [PSA]: Secondary | ICD-10-CM | POA: Diagnosis not present

## 2017-08-08 DIAGNOSIS — R3915 Urgency of urination: Secondary | ICD-10-CM | POA: Diagnosis not present

## 2017-09-05 ENCOUNTER — Other Ambulatory Visit: Payer: Self-pay | Admitting: Family Medicine

## 2017-10-05 ENCOUNTER — Other Ambulatory Visit: Payer: Self-pay | Admitting: Family Medicine

## 2017-11-24 ENCOUNTER — Encounter: Payer: Self-pay | Admitting: Family Medicine

## 2017-11-24 ENCOUNTER — Ambulatory Visit: Payer: Medicare Other | Admitting: Family Medicine

## 2017-11-24 VITALS — BP 137/84 | HR 68 | Temp 100.7°F | Ht 66.0 in | Wt 179.4 lb

## 2017-11-24 DIAGNOSIS — R509 Fever, unspecified: Secondary | ICD-10-CM | POA: Diagnosis not present

## 2017-11-24 DIAGNOSIS — J0110 Acute frontal sinusitis, unspecified: Secondary | ICD-10-CM | POA: Diagnosis not present

## 2017-11-24 LAB — VERITOR FLU A/B WAIVED
INFLUENZA B: NEGATIVE
Influenza A: NEGATIVE

## 2017-11-24 MED ORDER — AZITHROMYCIN 250 MG PO TABS: ORAL_TABLET | ORAL | 0 refills | Status: DC

## 2017-11-24 NOTE — Progress Notes (Signed)
BP 137/84   Pulse 68   Temp (!) 100.7 F (38.2 C) (Oral)   Ht 5\' 6"  (1.676 m)   Wt 179 lb 6.4 oz (81.4 kg)   SpO2 95%   BMI 28.96 kg/m    Subjective:    Patient ID: Corey Duran, male    DOB: 1951/02/13, 67 y.o.   MRN: [id number]  HPI: Corey Duran is a 66 y.o. male presenting on 11/24/2017 for Headache (x 1 day and has gotten worse. no OTC); Cough; Nasal Congestion; Generalized Body Aches; and Fatigue   HPI Headache and sinus congestion and cough and body aches Patient is coming in with headache and sinus congestion and cough and body aches.  He says that he is having fever and body aches that is been going on for the past 2 days.  The cough is been going on for a week.  The cough has been productive and he feels like it is getting down into his chest.  He also complains of headache and sinus pressure and congestion that is been going on.  Relevant past medical, surgical, family and social history reviewed and updated as indicated. Interim medical history since our last visit reviewed. Allergies and medications reviewed and updated.  Review of Systems  Constitutional: Positive for chills and fever.  HENT: Positive for congestion, postnasal drip, rhinorrhea, sinus pressure, sneezing and sore throat. Negative for ear discharge, ear pain and voice change.   Eyes: Negative for visual disturbance.  Respiratory: Positive for cough and chest tightness. Negative for shortness of breath and wheezing.   Cardiovascular: Negative for chest pain and leg swelling.  Musculoskeletal: Positive for myalgias. Negative for gait problem.  Skin: Negative for rash.  All other systems reviewed and are negative.   Per HPI unless specifically indicated above   Allergies as of 11/24/2017   No Known Allergies     Medication List        Accurate as of 11/24/17  2:32 PM. Always use your most recent med list.          azithromycin 250 MG tablet Commonly known as:  ZITHROMAX Take 2 the  first day and then one each day after.   clopidogrel 75 MG tablet Commonly known as:  PLAVIX TAKE 1 TABLET BY MOUTH DAILY   CoQ10 100 MG Caps Take 100 mg by mouth daily.   ezetimibe-simvastatin 10-80 MG tablet Commonly known as:  VYTORIN TAKE 1/2 TABLET BY MOUTH DAILY   finasteride 5 MG tablet Commonly known as:  PROSCAR Take 5 mg by mouth daily.   Fish Oil 1200 MG Caps Take 1,200 mg by mouth 2 (two) times daily.   fluocinonide-emollient 0.05 % cream Commonly known as:  LIDEX-E Apply 1 application topically 2 (two) times daily. To affected areas   metoprolol tartrate 25 MG tablet Commonly known as:  LOPRESSOR TAKE 1 TABLET BY MOUTH TWICE DAILY   MULTIVITAMIN PO Take 1 tablet by mouth daily.   nitroGLYCERIN 0.4 MG SL tablet Commonly known as:  NITROSTAT Place 1 tablet (0.4 mg total) every 5 (five) minutes as needed under the tongue for chest pain.   ranitidine 75 MG tablet Commonly known as:  ZANTAC Take 75 mg by mouth as needed (HEARTBURN).   tamsulosin 0.4 MG Caps capsule Commonly known as:  FLOMAX Take 0.4 mg by mouth daily.   tiZANidine 4 MG tablet Commonly known as:  ZANAFLEX TAKE 1 TABLET(4 MG) BY MOUTH EVERY 6 HOURS AS NEEDED FOR MUSCLE  SPASMS   traMADol 50 MG tablet Commonly known as:  ULTRAM Take 1 tablet (50 mg total) by mouth every 8 (eight) hours as needed.   valACYclovir 1000 MG tablet Commonly known as:  VALTREX Take 1 tablet (1,000 mg total) by mouth daily.          Objective:    BP 137/84   Pulse 68   Temp (!) 100.7 F (38.2 C) (Oral)   Ht 5\' 6"  (1.676 m)   Wt 179 lb 6.4 oz (81.4 kg)   SpO2 95%   BMI 28.96 kg/m   Wt Readings from Last 3 Encounters:  11/24/17 179 lb 6.4 oz (81.4 kg)  07/29/17 178 lb (80.7 kg)  02/07/17 181 lb 3.2 oz (82.2 kg)    Physical Exam  Constitutional: He is oriented to person, place, and time. He appears well-developed and well-nourished. No distress.  HENT:  Right Ear: Tympanic membrane, external ear  and ear canal normal.  Left Ear: Tympanic membrane, external ear and ear canal normal.  Nose: Mucosal edema present. No rhinorrhea or sinus tenderness. No epistaxis. Right sinus exhibits maxillary sinus tenderness and frontal sinus tenderness. Left sinus exhibits maxillary sinus tenderness and frontal sinus tenderness.  Mouth/Throat: Uvula is midline and mucous membranes are normal. Posterior oropharyngeal edema present. No oropharyngeal exudate, posterior oropharyngeal erythema or tonsillar abscesses.  Eyes: Conjunctivae are normal. Right eye exhibits no discharge. No scleral icterus.  Neck: Neck supple. No thyromegaly present.  Cardiovascular: Normal rate, regular rhythm, normal heart sounds and intact distal pulses.  No murmur heard. Pulmonary/Chest: Effort normal and breath sounds normal. No respiratory distress. He has no wheezes. He has no rales.  Musculoskeletal: Normal range of motion. He exhibits no edema.  Lymphadenopathy:    He has no cervical adenopathy.  Neurological: He is alert and oriented to person, place, and time. Coordination normal.  Skin: Skin is warm and dry. No rash noted. He is not diaphoretic.  Psychiatric: He has a normal mood and affect. His behavior is normal.  Nursing note and vitals reviewed.       Assessment & Plan:   Problem List Items Addressed This Visit    None    Visit Diagnoses    Acute non-recurrent frontal sinusitis    -  Primary   Relevant Medications   azithromycin (ZITHROMAX) 250 MG tablet   Other Relevant Orders   Veritor Flu A/B Waived       Follow up plan: Return if symptoms worsen or fail to improve.  Counseling provided for all of the vaccine components Orders Placed This Encounter  Procedures  . Veritor Flu A/B [place], MD [facility] 11/24/2017, 2:32 PM

## 2017-11-25 ENCOUNTER — Telehealth: Payer: Self-pay | Admitting: Family Medicine

## 2017-11-25 MED ORDER — BENZONATATE 200 MG PO CAPS: 200.0000 mg | ORAL_CAPSULE | Freq: Three times a day (TID) | ORAL | 1 refills | Status: DC | PRN

## 2017-11-25 NOTE — Telephone Encounter (Signed)
Tessalon Prescription sent to pharmacy   

## 2017-11-25 NOTE — Telephone Encounter (Signed)
Patient aware, script sent to pharmacy.

## 2018-01-06 ENCOUNTER — Other Ambulatory Visit: Payer: Self-pay | Admitting: Physician Assistant

## 2018-01-06 ENCOUNTER — Other Ambulatory Visit: Payer: Self-pay | Admitting: *Deleted

## 2018-01-06 MED ORDER — CLOPIDOGREL BISULFATE 75 MG PO TABS: 75.0000 mg | ORAL_TABLET | Freq: Every day | ORAL | 0 refills | Status: DC

## 2018-01-06 MED ORDER — METOPROLOL TARTRATE 25 MG PO TABS: 25.0000 mg | ORAL_TABLET | Freq: Two times a day (BID) | ORAL | 0 refills | Status: DC

## 2018-01-06 MED ORDER — EZETIMIBE-SIMVASTATIN 10-80 MG PO TABS: 0.5000 | ORAL_TABLET | Freq: Every day | ORAL | 0 refills | Status: DC

## 2018-02-12 ENCOUNTER — Ambulatory Visit: Payer: Medicare Other | Admitting: Family Medicine

## 2018-02-12 ENCOUNTER — Encounter: Payer: Self-pay | Admitting: Family Medicine

## 2018-02-12 VITALS — BP 119/71 | HR 51 | Temp 97.0°F | Ht 66.0 in | Wt 181.2 lb

## 2018-02-12 DIAGNOSIS — E7800 Pure hypercholesterolemia, unspecified: Secondary | ICD-10-CM | POA: Diagnosis not present

## 2018-02-12 DIAGNOSIS — R7989 Other specified abnormal findings of blood chemistry: Secondary | ICD-10-CM | POA: Diagnosis not present

## 2018-02-12 DIAGNOSIS — I2510 Atherosclerotic heart disease of native coronary artery without angina pectoris: Secondary | ICD-10-CM

## 2018-02-12 DIAGNOSIS — Z23 Encounter for immunization: Secondary | ICD-10-CM

## 2018-02-12 DIAGNOSIS — I10 Essential (primary) hypertension: Secondary | ICD-10-CM

## 2018-02-12 MED ORDER — TAMSULOSIN HCL 0.4 MG PO CAPS: 0.4000 mg | ORAL_CAPSULE | Freq: Every day | ORAL | 3 refills | Status: DC

## 2018-02-12 MED ORDER — METOPROLOL TARTRATE 25 MG PO TABS: ORAL_TABLET | ORAL | 3 refills | Status: DC

## 2018-02-12 MED ORDER — FINASTERIDE 5 MG PO TABS: 5.0000 mg | ORAL_TABLET | Freq: Every day | ORAL | 3 refills | Status: DC

## 2018-02-12 MED ORDER — EZETIMIBE-SIMVASTATIN 10-80 MG PO TABS: 0.5000 | ORAL_TABLET | Freq: Every day | ORAL | 3 refills | Status: DC

## 2018-02-12 MED ORDER — CLOPIDOGREL BISULFATE 75 MG PO TABS: ORAL_TABLET | ORAL | 3 refills | Status: DC

## 2018-02-12 NOTE — Progress Notes (Signed)
BP 119/71   Pulse (!) 51   Temp (!) 97 F (36.1 C) (Oral)   Ht 5' 6"  (1.676 m)   Wt 181 lb 3.2 oz (82.2 kg)   BMI 29.25 kg/m    Subjective:    Patient ID: Corey Duran, male    DOB: 1950/06/01, 67 y.o.   MRN: [id number]  HPI: Corey Duran is a 67 y.o. male presenting on 02/12/2018 for Hypertension (6 month follow up) and Establish Care   HPI Hypertension Patient is currently on metoprolol, is also on Plavix for CAD with history of stent, and their blood pressure today is 119/71 with a heart rate of 51. Patient denies any lightheadedness or dizziness. Patient denies headaches, blurred vision, chest pains, shortness of breath, or weakness. Denies any side effects from medication and is content with current medication.   Hyperlipidemia Patient is coming in for recheck of his hyperlipidemia. The patient is currently taking Vytorin and fish oil. They deny any issues with myalgias or history of liver damage from it. They deny any focal numbness or weakness or chest pain.  Patient had an elevated TSH on his last lab draws, he has been feeling fatigued and we will recheck his TSH today in the office.  Relevant past medical, surgical, family and social history reviewed and updated as indicated. Interim medical history since our last visit reviewed. Allergies and medications reviewed and updated.  Review of Systems  Constitutional: Negative for chills and fever.  Eyes: Negative for visual disturbance.  Respiratory: Negative for shortness of breath and wheezing.   Cardiovascular: Negative for chest pain and leg swelling.  Gastrointestinal: Negative for abdominal pain.  Musculoskeletal: Negative for back pain and gait problem.  Skin: Negative for rash.  Neurological: Negative for dizziness, weakness, light-headedness and headaches.  All other systems reviewed and are negative.   Per HPI unless specifically indicated above   Allergies as of 02/12/2018   No Known Allergies     Medication List        Accurate as of 02/12/18 10:53 AM. Always use your most recent med list.          clopidogrel 75 MG tablet Commonly known as:  PLAVIX TAKE 1 TABLET(75 MG) BY MOUTH DAILY   CoQ10 100 MG Caps Take 100 mg by mouth daily.   ezetimibe-simvastatin 10-80 MG tablet Commonly known as:  VYTORIN Take 0.5 tablets by mouth daily. (Needs to be seen before next refill)   finasteride 5 MG tablet Commonly known as:  PROSCAR Take 1 tablet (5 mg total) by mouth daily.   Fish Oil 1200 MG Caps Take 1,200 mg by mouth 2 (two) times daily.   fluocinonide-emollient 0.05 % cream Commonly known as:  LIDEX-E Apply 1 application topically 2 (two) times daily. To affected areas   metoprolol tartrate 25 MG tablet Commonly known as:  LOPRESSOR TAKE 1 TABLET(25 MG) BY MOUTH TWICE DAILY   MULTIVITAMIN PO Take 1 tablet by mouth daily.   nitroGLYCERIN 0.4 MG SL tablet Commonly known as:  NITROSTAT Place 1 tablet (0.4 mg total) every 5 (five) minutes as needed under the tongue for chest pain.   ranitidine 75 MG tablet Commonly known as:  ZANTAC Take 75 mg by mouth as needed (HEARTBURN).   tamsulosin 0.4 MG Caps capsule Commonly known as:  FLOMAX Take 1 capsule (0.4 mg total) by mouth daily.   tiZANidine 4 MG tablet Commonly known as:  ZANAFLEX TAKE 1 TABLET(4 MG) BY MOUTH EVERY 6  HOURS AS NEEDED FOR MUSCLE SPASMS   valACYclovir 1000 MG tablet Commonly known as:  VALTREX Take 1 tablet (1,000 mg total) by mouth daily.          Objective:    BP 119/71   Pulse (!) 51   Temp (!) 97 F (36.1 C) (Oral)   Ht 5' 6"  (1.676 m)   Wt 181 lb 3.2 oz (82.2 kg)   BMI 29.25 kg/m   Wt Readings from Last 3 Encounters:  02/12/18 181 lb 3.2 oz (82.2 kg)  11/24/17 179 lb 6.4 oz (81.4 kg)  07/29/17 178 lb (80.7 kg)    Physical Exam  Constitutional: He is oriented to person, place, and time. He appears well-developed and well-nourished. No distress.  Eyes: Pupils are equal,  round, and reactive to light. Conjunctivae and EOM are normal. No scleral icterus.  Neck: Neck supple. No thyromegaly present.  Cardiovascular: Normal rate, regular rhythm, normal heart sounds and intact distal pulses.  No murmur heard. Pulmonary/Chest: Effort normal and breath sounds normal. No respiratory distress. He has no wheezes.  Musculoskeletal: Normal range of motion. He exhibits no edema.  Lymphadenopathy:    He has no cervical adenopathy.  Neurological: He is alert and oriented to person, place, and time. Coordination normal.  Skin: Skin is warm and dry. No rash noted. He is not diaphoretic.  Psychiatric: He has a normal mood and affect. His behavior is normal.  Nursing note and vitals reviewed.       Assessment & Plan:   Problem List Items Addressed This Visit      Cardiovascular and Mediastinum   Coronary atherosclerosis of native coronary artery   Relevant Medications   ezetimibe-simvastatin (VYTORIN) 10-80 MG tablet   metoprolol tartrate (LOPRESSOR) 25 MG tablet   Other Relevant Orders   CBC with Differential/Platelet   Lipid panel   Essential hypertension, benign - Primary   Relevant Medications   ezetimibe-simvastatin (VYTORIN) 10-80 MG tablet   metoprolol tartrate (LOPRESSOR) 25 MG tablet   Other Relevant Orders   CBC with Differential/Platelet   CMP14+EGFR     Other   Hypercholesteremia   Relevant Medications   ezetimibe-simvastatin (VYTORIN) 10-80 MG tablet   metoprolol tartrate (LOPRESSOR) 25 MG tablet   Other Relevant Orders   Lipid panel    Other Visit Diagnoses    Elevated TSH       Relevant Orders   TSH   Encounter for immunization       Relevant Orders   Flu vaccine HIGH DOSE PF (Completed)    Continue Vytorin, patient will continue to see cardiology, also continue metoprolol, he is asymptomatic bradycardia currently but if he gets lower we may have to reduce the beta-blocker in the future.  Follow up plan: Return in about 6 months  (around 08/13/2018), or if symptoms worsen or fail to improve, for Hypertension and cholesterol recheck.  Counseling provided for all of the vaccine components Orders Placed This Encounter  Procedures  . Flu vaccine HIGH DOSE PF  . CBC with Differential/Platelet  . CMP14+EGFR  . Lipid panel  . TSH    Caryl Pina, MD [facility] 02/12/2018, 10:53 AM

## 2018-02-13 LAB — CMP14+EGFR
ALK PHOS: 67 IU/L (ref 39–117)
ALT: 28 IU/L (ref 0–44)
AST: 24 IU/L (ref 0–40)
Albumin/Globulin Ratio: 1.9 (ref 1.2–2.2)
Albumin: 4.3 g/dL (ref 3.6–4.8)
BILIRUBIN TOTAL: 0.5 mg/dL (ref 0.0–1.2)
BUN / CREAT RATIO: 20 (ref 10–24)
BUN: 19 mg/dL (ref 8–27)
CO2: 24 mmol/L (ref 20–29)
CREATININE: 0.93 mg/dL (ref 0.76–1.27)
Calcium: 9.3 mg/dL (ref 8.6–10.2)
Chloride: 101 mmol/L (ref 96–106)
GFR calc Af Amer: 96 mL/min/{1.73_m2} (ref >59)
GFR calc non Af Amer: 83 mL/min/{1.73_m2} (ref >59)
GLUCOSE: 93 mg/dL (ref 65–99)
Globulin, Total: 2.3 g/dL (ref 1.5–4.5)
Potassium: 4 mmol/L (ref 3.5–5.2)
SODIUM: 140 mmol/L (ref 134–144)
TOTAL PROTEIN: 6.6 g/dL (ref 6.0–8.5)

## 2018-02-13 LAB — CBC WITH DIFFERENTIAL/PLATELET
Basophils Absolute: 0 10*3/uL (ref 0.0–0.2)
Basos: 1 %
EOS (ABSOLUTE): 0.1 10*3/uL (ref 0.0–0.4)
Eos: 2 %
HEMOGLOBIN: 15.2 g/dL (ref 13.0–17.7)
Hematocrit: 44.2 % (ref 37.5–51.0)
IMMATURE GRANS (ABS): 0 10*3/uL (ref 0.0–0.1)
Immature Granulocytes: 0 %
LYMPHS: 30 %
Lymphocytes Absolute: 1.7 10*3/uL (ref 0.7–3.1)
MCH: 31.1 pg (ref 26.6–33.0)
MCHC: 34.4 g/dL (ref 31.5–35.7)
MCV: 90 fL (ref 79–97)
MONOCYTES: 8 %
Monocytes Absolute: 0.5 10*3/uL (ref 0.1–0.9)
NEUTROS ABS: 3.4 10*3/uL (ref 1.4–7.0)
Neutrophils: 59 %
PLATELETS: 209 10*3/uL (ref 150–450)
RBC: 4.89 x10E6/uL (ref 4.14–5.80)
RDW: 12.4 % (ref 12.3–15.4)
WBC: 5.7 10*3/uL (ref 3.4–10.8)

## 2018-02-13 LAB — LIPID PANEL
CHOL/HDL RATIO: 3.9 ratio (ref 0.0–5.0)
Cholesterol, Total: 140 mg/dL (ref 100–199)
HDL: 36 mg/dL — ABNORMAL LOW (ref >39)
LDL CALC: 71 mg/dL (ref 0–99)
TRIGLYCERIDES: 167 mg/dL — AB (ref 0–149)
VLDL CHOLESTEROL CAL: 33 mg/dL (ref 5–40)

## 2018-02-13 LAB — TSH: TSH: 5.78 u[IU]/mL — ABNORMAL HIGH (ref 0.450–4.500)

## 2018-02-19 DIAGNOSIS — D1801 Hemangioma of skin and subcutaneous tissue: Secondary | ICD-10-CM | POA: Diagnosis not present

## 2018-02-19 DIAGNOSIS — L820 Inflamed seborrheic keratosis: Secondary | ICD-10-CM | POA: Diagnosis not present

## 2018-02-19 DIAGNOSIS — D0462 Carcinoma in situ of skin of left upper limb, including shoulder: Secondary | ICD-10-CM | POA: Diagnosis not present

## 2018-02-19 DIAGNOSIS — L821 Other seborrheic keratosis: Secondary | ICD-10-CM | POA: Diagnosis not present

## 2018-02-19 DIAGNOSIS — Z85828 Personal history of other malignant neoplasm of skin: Secondary | ICD-10-CM | POA: Diagnosis not present

## 2018-03-02 ENCOUNTER — Other Ambulatory Visit: Payer: Self-pay

## 2018-03-02 MED ORDER — LEVOTHYROXINE SODIUM 50 MCG PO TABS: 50.0000 ug | ORAL_TABLET | Freq: Every day | ORAL | 1 refills | Status: DC

## 2018-03-19 ENCOUNTER — Encounter

## 2018-03-19 ENCOUNTER — Ambulatory Visit: Payer: Medicare Other | Admitting: Interventional Cardiology

## 2018-03-19 ENCOUNTER — Encounter: Payer: Self-pay | Admitting: Interventional Cardiology

## 2018-03-19 VITALS — BP 120/72 | HR 55 | Ht 66.0 in | Wt 182.4 lb

## 2018-03-19 DIAGNOSIS — I10 Essential (primary) hypertension: Secondary | ICD-10-CM

## 2018-03-19 DIAGNOSIS — E7800 Pure hypercholesterolemia, unspecified: Secondary | ICD-10-CM | POA: Diagnosis not present

## 2018-03-19 DIAGNOSIS — G4733 Obstructive sleep apnea (adult) (pediatric): Secondary | ICD-10-CM

## 2018-03-19 DIAGNOSIS — I2510 Atherosclerotic heart disease of native coronary artery without angina pectoris: Secondary | ICD-10-CM

## 2018-03-19 NOTE — Patient Instructions (Signed)
Medication Instructions:  Your physician recommends that you continue on your current medications as directed. Please refer to the Current Medication list given to you today.  If you need a refill on your cardiac medications before your next appointment, please call your pharmacy.   Lab work: None Ordered  If you have labs (blood work) drawn today and your tests are completely normal, you will receive your results only by: Marland Kitchen MyChart Message (if you have MyChart) OR . A paper copy in the mail If you have any lab test that is abnormal or we need to change your treatment, we will call you to review the results.  Testing/Procedures: Your physician has requested that you have an exercise tolerance test. For further information please visit [email or web address]. Please also follow instruction sheet, as given.  Follow-Up: At [facility], you and your health needs are our priority.  As part of our continuing mission to provide you with exceptional heart care, we have created designated Provider Care Teams.  These Care Teams include your primary Cardiologist (physician) and Advanced Practice Providers (APPs -  Physician Assistants and Nurse Practitioners) who all work together to provide you with the care you need, when you need it. . You will need a follow up appointment in 1 year.  Please call our office 2 months in advance to schedule this appointment.  You may see Casandra Doffing, MD or one of the following Advanced Practice Providers on your designated Care Team:   . Lyda Jester, PA-C . Dayna Dunn, PA-C . Ermalinda Barrios, PA-C  Any Other Special Instructions Will Be Listed Below (If Applicable). Exercise Stress Test An exercise stress test is a test to check how your heart works during exercise. You will need to walk on a treadmill or ride an exercise bike for this test. An electrocardiogram (ECG) will record your heartbeat when you are at rest and when you are exercising. You may have an  ultrasound or nuclear test after the exercise test. The test is done to check for coronary artery disease (CAD). It is also done to:  See how well you can exercise.  Watch for high blood pressure during exercise.  Test how well you can exercise after treatment.  Check the blood flow to your arms and legs. If your test result is not normal, more testing may be needed. What happens before the procedure?  Follow instructions from your doctor about what you cannot eat or drink. ? Do not have any drinks or foods that have caffeine in them for 24 hours before the test, or as told by your doctor. This includes coffee, tea (even decaf tea), sodas, chocolate, and cocoa.  Ask your doctor about changing or stopping your normal medicines. This is important if you: ? Take diabetes medicines. ? Take beta-blocker medicines. ? Wear a nitroglycerin patch.  If you use an inhaler, bring it with you to the test.  Do not put lotions, powders, creams, or oils on your chest before the test.  Wear comfortable shoes and clothing.  Do not use any products that have nicotine or tobacco in them, such as cigarettes and e-cigarettes. Stop using them at least 4 hours before the test. If you need help quitting, ask your doctor. What happens during the procedure?  Patches (electrodes) will be put on your chest.  Wires will be connected to the patches. The wires will send signals to a machine to record your heartbeat.  Your heart rate will be watched while you are  resting and while you are exercising. Your blood pressure will also be watched during the test.  You will walk on a treadmill or use a stationary bike. If you cannot use these, you may be asked to turn a crank with your hands.  The activity will get harder and will raise your heart rate.  You may be asked to breathe into a tube a few times during the test. This measures the gases that you breathe out.  You will be asked how you are feeling  throughout the test.  You will exercise until your heart reaches a target heart rate. You will stop early if: ? You feel dizzy. ? You have chest pain. ? You are out of breath. ? Your blood pressure is too high or too low. ? You have an irregular heartbeat. ? You have pain or aching in your arms or legs. The procedure may vary among doctors and hospitals. What happens after the procedure?  Your blood pressure, heart rate, breathing rate, and blood oxygen level will be watched after the test.  You may return to your normal diet and activities as told by your doctor.  It is up to you to get the results of your test. Ask your doctor, or the department that is doing the test, when your results will be ready. Summary  An exercise stress test is a test to check how your heart works during exercise.  This test is done to check for coronary artery disease.  Your heart rate will be watched while you are resting and while you are exercising.  Follow instructions from your doctor about what you cannot eat or drink before the test. This information is not intended to replace advice given to you by your health care provider. Make sure you discuss any questions you have with your health care provider. Document Released: 09/04/2007 Document Revised: 06/18/2016 Document Reviewed: 06/18/2016 Elsevier Interactive Patient Education  2019 [employer].

## 2018-03-19 NOTE — Progress Notes (Signed)
Cardiology Office Note   Date:  03/19/2018   ID:  [name], DOB 01-10-1951, MRN [id number]  PCP:  Dettinger, Fransisca Kaufmann, MD    No chief complaint on file.  CAD  Wt Readings from Last 3 Encounters:  03/19/18 182 lb 6.4 oz (82.7 kg)  02/12/18 181 lb 3.2 oz (82.2 kg)  11/24/17 179 lb 6.4 oz (81.4 kg)       History of Present Illness: Corey Duran is a 67 y.o. male  who has had CAD. He had an LAD stent ( 3.5 x 16 DES ( Perseus Duran)) in 2008. He had been on CPAP in the past but stopped it due to the inconvenience. He sleeps better with the CPAP, but it was not worth the hassle. He plays golf, dances for exercise.  He has lost some weight in 2017 through diet control.  He lost more in 2017.   Denies : Chest pain. Dizziness. Leg edema. Nitroglycerin use. Orthopnea. Palpitations. Paroxysmal nocturnal dyspnea. Shortness of breath. Syncope.   Has had some fatigue.  Not exercising as much as he has in the past.   Past Medical History:  Diagnosis Date  . BPH (benign prostatic hypertrophy)    followed by Dr. Reece Agar in the past; Now Dr. Diona Fanti  . Coronary atherosclerosis of native coronary artery    05/2006, stent LAD, patent 03/2007, Duran stent  . CTS (carpal tunnel syndrome)    1993, 2002  . GERD (gastroesophageal reflux disease)   . Hypercholesteremia   . Insomnia   . Laceration of leg    severe, 2009  . LBP (low back pain)   . OSA (obstructive sleep apnea)    PSG 07/23/10 ESS 11, AHI 16/hr REM 18/hr, RDI 17/hr REM 20/hr, O2 min 87%; CPAP 08/22/10 CPAP 10 with AHI 0  . Tenosynovitis of finger    left third finger    Past Surgical History:  Procedure Laterality Date  . ROTATOR CUFF REPAIR Right 0918/2018     Current Outpatient Medications  Medication Sig Dispense Refill  . clopidogrel (PLAVIX) 75 MG tablet TAKE 1 TABLET(75 MG) BY MOUTH DAILY 90 tablet 3  . Coenzyme Q10 (COQ10) 100 MG CAPS Take 100 mg by mouth daily.    Marland Kitchen ezetimibe-simvastatin  (Corey Duran) 10-80 MG tablet Take 0.5 tablets by mouth daily. (Needs to be seen before next refill) 45 tablet 3  . finasteride (PROSCAR) 5 MG tablet Take 1 tablet (5 mg total) by mouth daily. 90 tablet 3  . levothyroxine (SYNTHROID, LEVOTHROID) 50 MCG tablet Take 1 tablet (50 mcg total) by mouth daily. 90 tablet 1  . metoprolol tartrate (LOPRESSOR) 25 MG tablet TAKE 1 TABLET(25 MG) BY MOUTH TWICE DAILY 180 tablet 3  . Multiple Vitamins-Minerals (MULTIVITAMIN PO) Take 1 tablet by mouth daily.    . nitroGLYCERIN (NITROSTAT) 0.4 MG SL tablet Place 1 tablet (0.4 mg total) every 5 (five) minutes as needed under the tongue for chest pain. 25 tablet 3  . Omega-3 Fatty Acids (FISH OIL) 1200 MG CAPS Take 1,200 mg by mouth 2 (two) times daily.    . ranitidine (ZANTAC) 75 MG tablet Take 75 mg by mouth as needed (HEARTBURN).     . tamsulosin (FLOMAX) 0.4 MG CAPS capsule Take 1 capsule (0.4 mg total) by mouth daily. 90 capsule 3  . valACYclovir (VALTREX) 1000 MG tablet Take 1 tablet (1,000 mg total) by mouth daily. 90 tablet 1   No current facility-administered medications for this visit.  Allergies:   Patient has no known allergies.    Social History:  The patient  reports that he has quit smoking. He has quit using smokeless tobacco. He reports current alcohol use. He reports that he does not use drugs.   Family History:  The patient's family history includes CAD in his father; Dementia in his mother; Heart attack in his father; Hypertension in his father; Ulcers in his father.    ROS:  Please see the history of present illness.   Otherwise, review of systems are positive for starting thyroid medicine recently, frequent urination.   All other systems are reviewed and negative.    PHYSICAL EXAM: VS:  BP 120/72   Pulse (!) 55   Ht 5\' 6"  (1.676 m)   Wt 182 lb 6.4 oz (82.7 kg)   SpO2 96%   BMI 29.44 kg/m  , BMI Body mass index is 29.44 kg/m. GEN: Well nourished, well developed, in no acute  distress  HEENT: normal  Neck: no JVD, carotid bruits, or masses Cardiac: RRR; no murmurs, rubs, or gallops,no edema  Respiratory:  clear to auscultation bilaterally, normal work of breathing GI: soft, nontender, nondistended, + BS MS: no deformity or atrophy  Skin: warm and dry, no rash Neuro:  Strength and sensation are intact Psych: euthymic mood, full affect   EKG:   The ekg ordered today demonstrates NSR, no ST segment changes   Recent Labs: 02/12/2018: ALT 28; BUN 19; Creatinine, Ser 0.93; Hemoglobin 15.2; Platelets 209; Potassium 4.0; Sodium 140; TSH 5.780   Lipid Panel    Component Value Date/Time   CHOL 140 02/12/2018 1145   TRIG 167 (H) 02/12/2018 1145   HDL 36 (L) 02/12/2018 1145   CHOLHDL 3.9 02/12/2018 1145   CHOLHDL 4.3 01/24/2015 1147   VLDL 32 (H) 01/24/2015 1147   LDLCALC 71 02/12/2018 1145   LDLDIRECT 70.4 03/29/2014 1141     Other studies Reviewed: Additional studies/ records that were reviewed today with results demonstrating: cath report reviewed, labs reviewed.   ASSESSMENT AND PLAN:  1. CAD: No angina.  Plan for stress test given  that he had fatigue prior to PCI.  ETT since ECG normal.  2. Hyperlipidemia: LDL 71 in 11/19.  Continue Corey Duran.  3. HTN: The current medical regimen is effective;  continue present plan and medications. 4. OSA: Not using CPAP.   5. BPH: Interrupts sleep.     Current medicines are reviewed at length with the patient today.  The patient concerns regarding his medicines were addressed.  The following changes have been made:  No change  Labs/ tests ordered today include: ETT No orders of the defined types were placed in this encounter.   Recommend 150 minutes/week of aerobic exercise Low fat, low carb, high fiber diet recommended  Disposition:   FU in 1 year   Signed, Larae Grooms, MD  03/19/2018 3:31 PM    [facility] [address], [place], [place]  [address] Phone: [phone]; Fax: [phone]

## 2018-04-02 ENCOUNTER — Ambulatory Visit (INDEPENDENT_AMBULATORY_CARE_PROVIDER_SITE_OTHER): Payer: Medicare Other

## 2018-04-02 DIAGNOSIS — I2510 Atherosclerotic heart disease of native coronary artery without angina pectoris: Secondary | ICD-10-CM

## 2018-04-02 LAB — EXERCISE TOLERANCE TEST
CHL CUP MPHR: 151 {beats}/min
CHL RATE OF PERCEIVED EXERTION: 17
CSEPEDS: 0 s
CSEPHR: 90 %
CSEPPHR: 137 {beats}/min
Estimated workload: 10.1 METS
Exercise duration (min): 9 min
Rest HR: 59 {beats}/min

## 2018-04-07 ENCOUNTER — Ambulatory Visit: Payer: Medicare Other | Admitting: Interventional Cardiology

## 2018-05-27 ENCOUNTER — Telehealth: Payer: Self-pay | Admitting: Family Medicine

## 2018-05-27 NOTE — Telephone Encounter (Signed)
Pt is needing a refill on      levothyroxine (SYNTHROID, LEVOTHROID) 50 MCG tablet   [place], [place]

## 2018-05-27 NOTE — Telephone Encounter (Signed)
Pt aware to check with pharmacy, refilled for 6 mos in December

## 2018-06-01 ENCOUNTER — Ambulatory Visit: Payer: Medicare Other | Admitting: Interventional Cardiology

## 2018-08-04 ENCOUNTER — Ambulatory Visit (INDEPENDENT_AMBULATORY_CARE_PROVIDER_SITE_OTHER): Payer: Medicare Other | Admitting: Physician Assistant

## 2018-08-04 ENCOUNTER — Other Ambulatory Visit: Payer: Self-pay

## 2018-08-04 ENCOUNTER — Encounter: Payer: Self-pay | Admitting: Physician Assistant

## 2018-08-04 DIAGNOSIS — H1131 Conjunctival hemorrhage, right eye: Secondary | ICD-10-CM | POA: Diagnosis not present

## 2018-08-04 NOTE — Progress Notes (Signed)
Telephone visit  Subjective: [id number] on eye PCP: Dettinger, Fransisca Kaufmann, MD [id number] R Corey Duran is a 68 y.o. male calls for telephone consult today. Patient provides verbal consent for consult held via phone.  Patient is identified with 2 separate identifiers.  At this time the entire area is on COVID-19 social distancing and stay home orders are in place.  Patient is of higher risk and therefore we are performing this by a virtual method.  Location of patient: home Location of provider: [facility] Others present for call: no  About 4 PM yesterday the patient's wife noticed that he had some bright red blood on his eye.  He has had a subconjunctival hemorrhage in the past and was not afraid of it.  He states that this 1 was just larger than normal.  And it was in the right eye on the medial portion and went all the way from 11:00 on the pupil to 500 on the pupil.  It is not bothering his vision.  He denies any fever chills or injuries.  He does take Plavix.  We discussed this might have caused him to have a little bit more bleeding than normal he takes it for stents which were placed many years ago.  So at this time will have him stop his Plavix for about 4 days and then he can call us back if anything gets worse.   ROS: Per HPI  No Known Allergies Past Medical History:  Diagnosis Date  . BPH (benign prostatic hypertrophy)    followed by Dr. Reece Agar in the past; Now Dr. Diona Fanti  . Coronary atherosclerosis of native coronary artery    05/2006, stent LAD, patent 03/2007, study stent  . CTS (carpal tunnel syndrome)    1993, 2002  . GERD (gastroesophageal reflux disease)   . Hypercholesteremia   . Insomnia   . Laceration of leg    severe, 2009  . LBP (low back pain)   . OSA (obstructive sleep apnea)    PSG 07/23/10 ESS 11, AHI 16/hr REM 18/hr, RDI 17/hr REM 20/hr, O2 min 87%; CPAP 08/22/10 CPAP 10 with AHI 0  . Tenosynovitis of finger    left third finger    Current Outpatient  Medications:  .  clopidogrel (PLAVIX) 75 MG tablet, TAKE 1 TABLET(75 MG) BY MOUTH DAILY, Disp: 90 tablet, Rfl: 3 .  Coenzyme Q10 (COQ10) 100 MG CAPS, Take 100 mg by mouth daily., Disp: , Rfl:  .  ezetimibe-simvastatin (VYTORIN) 10-80 MG tablet, Take 0.5 tablets by mouth daily. (Needs to be seen before next refill), Disp: 45 tablet, Rfl: 3 .  finasteride (PROSCAR) 5 MG tablet, Take 1 tablet (5 mg total) by mouth daily., Disp: 90 tablet, Rfl: 3 .  levothyroxine (SYNTHROID, LEVOTHROID) 50 MCG tablet, Take 1 tablet (50 mcg total) by mouth daily., Disp: 90 tablet, Rfl: 1 .  metoprolol tartrate (LOPRESSOR) 25 MG tablet, TAKE 1 TABLET(25 MG) BY MOUTH TWICE DAILY, Disp: 180 tablet, Rfl: 3 .  Multiple Vitamins-Minerals (MULTIVITAMIN PO), Take 1 tablet by mouth daily., Disp: , Rfl:  .  nitroGLYCERIN (NITROSTAT) 0.4 MG SL tablet, Place 1 tablet (0.4 mg total) every 5 (five) minutes as needed under the tongue for chest pain., Disp: 25 tablet, Rfl: 3 .  Omega-3 Fatty Acids (FISH OIL) 1200 MG CAPS, Take 1,200 mg by mouth 2 (two) times daily., Disp: , Rfl:  .  tamsulosin (FLOMAX) 0.4 MG CAPS capsule, Take 1 capsule (0.4 mg total) by mouth daily., Disp:  90 capsule, Rfl: 3 .  valACYclovir (VALTREX) 1000 MG tablet, Take 1 tablet (1,000 mg total) by mouth daily., Disp: 90 tablet, Rfl: 1  Assessment/ Plan: 68 y.o. male   Subconjunctival hemorrhage of right eye Reassure and monitor Hold Plavix for 4 days then resume   Start time: 1:29 PM End time: 1:34 PM  No orders of the defined types were placed in this encounter.   Particia Nearing PA-C [place] [phone]

## 2018-08-04 NOTE — Patient Instructions (Signed)
Subconjunctival Hemorrhage    Subconjunctival hemorrhage is bleeding that happens between the Knecht part of your eye (sclera) and the clear membrane that covers the outside of your eye (conjunctiva). There are many tiny blood vessels near the surface of your eye. A subconjunctival hemorrhage happens when one or more of these vessels breaks and bleeds, causing a red patch to appear on your eye. This is similar to a bruise.  Depending on the amount of bleeding, the red patch may only cover a small area of your eye or it may cover the entire visible part of the sclera. If a lot of blood collects under the conjunctiva, there may also be swelling. Subconjunctival hemorrhages do not affect your vision or cause pain, but your eye may feel irritated if there is swelling. Subconjunctival hemorrhages usually do not require treatment, and they disappear on their own within two weeks.  What are the causes?  This condition may be caused by:   Mild trauma, such as rubbing your eye too hard.   Severe trauma or blunt injuries.   Coughing, sneezing, or vomiting.   Straining, such as when lifting a heavy object.   High blood pressure.   Recent eye surgery.   A history of diabetes.   Certain medicines, especially blood thinners (anticoagulants).   Other conditions, such as eye tumors, bleeding disorders, or blood vessel abnormalities.  Subconjunctival hemorrhages can happen without an obvious cause.  What are the signs or symptoms?  Symptoms of this condition include:   A bright red or dark red patch on the Beitzel part of the eye.  ? The red area may spread out to cover a larger area of the eye before it goes away.  ? The red area may turn brownish-yellow before it goes away.   Swelling.   Mild eye irritation.  How is this diagnosed?  This condition is diagnosed with a physical exam. If your subconjunctival hemorrhage was caused by trauma, your health care provider may refer you to an eye specialist (ophthalmologist) or  another specialist to check for other injuries. You may have other tests, including:   An eye exam.   A blood pressure check.   Blood tests to check for bleeding disorders.  If your subconjunctival hemorrhage was caused by trauma, X-rays or a CT scan may be done to check for other injuries.  How is this treated?  Usually, no treatment is needed. Your health care provider may recommend eye drops or cold compresses to help with discomfort.  Follow these instructions at home:   Take over-the-counter and prescription medicines only as directed by your health care provider.   Use eye drops or cold compresses to help with discomfort as directed by your health care provider.   Avoid activities, things, and environments that may irritate or injure your eye.   Keep all follow-up visits as told by your health care provider. This is important.  Contact a health care provider if:   You have pain in your eye.   The bleeding does not go away within 3 weeks.   You keep getting new subconjunctival hemorrhages.  Get help right away if:   Your vision changes or you have difficulty seeing.   You suddenly develop severe sensitivity to light.   You develop a severe headache, persistent vomiting, confusion, or abnormal tiredness (lethargy).   Your eye seems to bulge or protrude from your eye socket.   You develop unexplained bruises on your body.   You have   unexplained bleeding in another area of your body.  This information is not intended to replace advice given to you by your health care provider. Make sure you discuss any questions you have with your health care provider.  Document Released: 03/18/2005 Document Revised: 11/17/2017 Document Reviewed: 05/25/2014  Elsevier Interactive Patient Education  2019 [employer]

## 2018-08-12 ENCOUNTER — Other Ambulatory Visit: Payer: Self-pay

## 2018-08-13 ENCOUNTER — Encounter: Payer: Self-pay | Admitting: Family Medicine

## 2018-08-13 ENCOUNTER — Ambulatory Visit (INDEPENDENT_AMBULATORY_CARE_PROVIDER_SITE_OTHER): Payer: Medicare Other

## 2018-08-13 ENCOUNTER — Ambulatory Visit (INDEPENDENT_AMBULATORY_CARE_PROVIDER_SITE_OTHER): Payer: Medicare Other | Admitting: Family Medicine

## 2018-08-13 VITALS — BP 131/78 | HR 52 | Temp 97.1°F | Ht 66.0 in | Wt 180.2 lb

## 2018-08-13 DIAGNOSIS — Z1211 Encounter for screening for malignant neoplasm of colon: Secondary | ICD-10-CM

## 2018-08-13 DIAGNOSIS — I10 Essential (primary) hypertension: Secondary | ICD-10-CM

## 2018-08-13 DIAGNOSIS — M1611 Unilateral primary osteoarthritis, right hip: Secondary | ICD-10-CM

## 2018-08-13 DIAGNOSIS — M25551 Pain in right hip: Secondary | ICD-10-CM

## 2018-08-13 DIAGNOSIS — E7800 Pure hypercholesterolemia, unspecified: Secondary | ICD-10-CM | POA: Diagnosis not present

## 2018-08-13 DIAGNOSIS — R351 Nocturia: Secondary | ICD-10-CM

## 2018-08-13 DIAGNOSIS — E039 Hypothyroidism, unspecified: Secondary | ICD-10-CM | POA: Diagnosis not present

## 2018-08-13 DIAGNOSIS — N401 Enlarged prostate with lower urinary tract symptoms: Secondary | ICD-10-CM

## 2018-08-13 MED ORDER — FINASTERIDE 5 MG PO TABS: 5.0000 mg | ORAL_TABLET | Freq: Every day | ORAL | 3 refills | Status: DC

## 2018-08-13 MED ORDER — LEVOTHYROXINE SODIUM 50 MCG PO TABS: 50.0000 ug | ORAL_TABLET | Freq: Every day | ORAL | 3 refills | Status: DC

## 2018-08-13 MED ORDER — CLOPIDOGREL BISULFATE 75 MG PO TABS: ORAL_TABLET | ORAL | 3 refills | Status: DC

## 2018-08-13 MED ORDER — TAMSULOSIN HCL 0.4 MG PO CAPS: 0.4000 mg | ORAL_CAPSULE | Freq: Every day | ORAL | 3 refills | Status: DC

## 2018-08-13 MED ORDER — METOPROLOL TARTRATE 25 MG PO TABS: ORAL_TABLET | ORAL | 3 refills | Status: DC

## 2018-08-13 MED ORDER — EZETIMIBE-SIMVASTATIN 10-80 MG PO TABS: 0.5000 | ORAL_TABLET | Freq: Every day | ORAL | 3 refills | Status: DC

## 2018-08-13 MED ORDER — MELOXICAM 15 MG PO TABS: 15.0000 mg | ORAL_TABLET | Freq: Every day | ORAL | 1 refills | Status: DC

## 2018-08-13 NOTE — Addendum Note (Signed)
Addended by: Caryl Pina on: 08/13/2018 02:42 PM   Modules accepted: Orders

## 2018-08-13 NOTE — Progress Notes (Signed)
BP 131/78   Pulse (!) 52   Temp (!) 97.1 F (36.2 C) (Oral)   Ht _0  (1.676 m)   Wt 180 lb 3.2 oz (81.7 kg)   BMI 29.09 kg/m    Subjective:   Patient ID: Corey Duran, male    DOB: [birth year], 68 y.o.   MRN: [id number]  HPI: Corey Duran is a 68 y.o. male presenting on 08/13/2018 for Hypertension (6 month follow up- Patient also states he has been having groin pain.)   HPI Hypertension Patient is currently on metoprolol, and their blood pressure today is 131/78. Patient denies any lightheadedness or dizziness. Patient denies headaches, blurred vision, chest pains, shortness of breath, or weakness. Denies any side effects from medication and is content with current medication.   Hyperlipidemia Patient is coming in for recheck of his hyperlipidemia. The patient is currently taking Vytorin. They deny any issues with myalgias or history of liver damage from it. They deny any focal numbness or weakness or chest pain.   Right groin pain Patient is coming in complaining of right groin pain that is been bothering him over the past few months at least but has been worsening over the past couple months.  Patient says that the pain is worse when he rides his bike and pain is worse with movement of the hip and almost any direction.  He says it hurts right across the groin line and he thought initially that he pulled something but that has persisted.  Patient has BPH and has a urologist that he is going to see later.  He is currently on finasteride and Flomax and says he still waking up 4-5 times per night and still having some hesitancy and some difficulty emptying his bladder at times.  He denies any fevers or chills or flank pain or abdominal pain.  Relevant past medical, surgical, family and social history reviewed and updated as indicated. Interim medical history since our last visit reviewed. Allergies and medications reviewed and updated.  Review of Systems  Constitutional:  Negative for chills and fever.  Eyes: Negative for visual disturbance.  Respiratory: Negative for shortness of breath and wheezing.   Cardiovascular: Negative for chest pain and leg swelling.  Gastrointestinal: Negative for abdominal pain.  Genitourinary: Positive for decreased urine volume, difficulty urinating and frequency. Negative for flank pain, hematuria and urgency.  Musculoskeletal: Positive for arthralgias. Negative for back pain, gait problem and myalgias.  Skin: Negative for rash.  Neurological: Negative for dizziness, weakness, light-headedness and headaches.  All other systems reviewed and are negative.   Per HPI unless specifically indicated above   Allergies as of 08/13/2018   No Known Allergies     Medication List       Accurate as of Aug 13, 2018 11:31 AM. If you have any questions, ask your nurse or doctor.        clopidogrel 75 MG tablet Commonly known as:  PLAVIX TAKE 1 TABLET(75 MG) BY MOUTH DAILY   CoQ10 100 MG Caps Take 100 mg by mouth daily.   ezetimibe-simvastatin 10-80 MG tablet Commonly known as:  VYTORIN Take 0.5 tablets by mouth daily. (Needs to be seen before next refill)   finasteride 5 MG tablet Commonly known as:  PROSCAR Take 1 tablet (5 mg total) by mouth daily.   Fish Oil 1200 MG Caps Take 1,200 mg by mouth 2 (two) times daily.   levothyroxine 50 MCG tablet Commonly known as:  SYNTHROID Take 1 tablet (  50 mcg total) by mouth daily.   metoprolol tartrate 25 MG tablet Commonly known as:  LOPRESSOR TAKE 1 TABLET(25 MG) BY MOUTH TWICE DAILY   MULTIVITAMIN PO Take 1 tablet by mouth daily.   nitroGLYCERIN 0.4 MG SL tablet Commonly known as:  NITROSTAT Place 1 tablet (0.4 mg total) every 5 (five) minutes as needed under the tongue for chest pain.   tamsulosin 0.4 MG Caps capsule Commonly known as:  FLOMAX Take 1 capsule (0.4 mg total) by mouth daily.   valACYclovir 1000 MG tablet Commonly known as:  VALTREX Take 1 tablet  (1,000 mg total) by mouth daily.        Objective:   BP 131/78   Pulse (!) 52   Temp (!) 97.1 F (36.2 C) (Oral)   Ht _0  (1.676 m)   Wt 180 lb 3.2 oz (81.7 kg)   BMI 29.09 kg/m   Wt Readings from Last 3 Encounters:  08/13/18 180 lb 3.2 oz (81.7 kg)  03/19/18 182 lb 6.4 oz (82.7 kg)  02/12/18 181 lb 3.2 oz (82.2 kg)    Physical Exam Vitals signs and nursing note reviewed.  Constitutional:      General: He is not in acute distress.    Appearance: He is well-developed. He is not diaphoretic.  Eyes:     General: No scleral icterus.    Conjunctiva/sclera: Conjunctivae normal.  Neck:     Musculoskeletal: Neck supple.     Thyroid: No thyromegaly.  Cardiovascular:     Rate and Rhythm: Normal rate and regular rhythm.     Heart sounds: Normal heart sounds. No murmur.  Pulmonary:     Effort: Pulmonary effort is normal. No respiratory distress.     Breath sounds: Normal breath sounds. No wheezing.  Musculoskeletal: Normal range of motion.     Right hip: He exhibits normal range of motion (No decreased range of motion but has pain with external and internal rotation of the hip in the groin), normal strength and no tenderness (No tenderness to palpation).  Lymphadenopathy:     Cervical: No cervical adenopathy.  Skin:    General: Skin is warm and dry.     Findings: No rash.  Neurological:     Mental Status: He is alert and oriented to person, place, and time.     Coordination: Coordination normal.  Psychiatric:        Behavior: Behavior normal.     Right hip x-ray: Signs of bone spurs and arthritis in both hips, joint space still appears to be intact.  Await final read from radiologist  Assessment & Plan:   Problem List Items Addressed This Visit      Cardiovascular and Mediastinum   Essential hypertension, benign   Relevant Medications   ezetimibe-simvastatin (VYTORIN) 10-80 MG tablet   metoprolol tartrate (LOPRESSOR) 25 MG tablet   Other Relevant Orders   CBC  with Differential/Platelet   CMP14+EGFR     Endocrine   Hypothyroidism   Relevant Medications   levothyroxine (SYNTHROID) 50 MCG tablet   metoprolol tartrate (LOPRESSOR) 25 MG tablet   Other Relevant Orders   CBC with Differential/Platelet   TSH     Other   Hypercholesteremia - Primary   Relevant Medications   ezetimibe-simvastatin (VYTORIN) 10-80 MG tablet   metoprolol tartrate (LOPRESSOR) 25 MG tablet   Other Relevant Orders   Lipid panel    Other Visit Diagnoses    Primary osteoarthritis of right hip  Relevant Orders   DG HIP UNILAT W OR W/O PELVIS 2-3 VIEWS RIGHT   Benign prostatic hyperplasia with nocturia       Relevant Medications   finasteride (PROSCAR) 5 MG tablet   tamsulosin (FLOMAX) 0.4 MG CAPS capsule    Will do anti-inflammatories to see if we can control the right hip pain as it is likely osteoarthritis  Follow up plan: Return in about 3 months (around 11/13/2018), or if symptoms worsen or fail to improve, for Hypertension and thyroid follow-up.  Counseling provided for all of the vaccine components Orders Placed This Encounter  Procedures  . DG HIP UNILAT W OR W/O PELVIS 2-3 VIEWS RIGHT  . CBC with Differential/Platelet  . CMP14+EGFR  . Lipid panel  . TSH    Caryl Pina, MD [facility] 08/13/2018, 11:31 AM

## 2018-08-14 ENCOUNTER — Telehealth: Payer: Self-pay | Admitting: Family Medicine

## 2018-08-14 DIAGNOSIS — M93001 Unspecified slipped upper femoral epiphysis (nontraumatic), right hip: Secondary | ICD-10-CM

## 2018-08-14 DIAGNOSIS — M93002 Unspecified slipped upper femoral epiphysis (nontraumatic), left hip: Secondary | ICD-10-CM

## 2018-08-14 LAB — CBC WITH DIFFERENTIAL/PLATELET
Basophils Absolute: 0 10*3/uL (ref 0.0–0.2)
Basos: 0 %
EOS (ABSOLUTE): 0.1 10*3/uL (ref 0.0–0.4)
Eos: 2 %
Hematocrit: 46.9 % (ref 37.5–51.0)
Hemoglobin: 16.1 g/dL (ref 13.0–17.7)
Immature Grans (Abs): 0 10*3/uL (ref 0.0–0.1)
Immature Granulocytes: 0 %
Lymphocytes Absolute: 1.5 10*3/uL (ref 0.7–3.1)
Lymphs: 24 %
MCH: 31.9 pg (ref 26.6–33.0)
MCHC: 34.3 g/dL (ref 31.5–35.7)
MCV: 93 fL (ref 79–97)
Monocytes Absolute: 0.4 10*3/uL (ref 0.1–0.9)
Monocytes: 7 %
Neutrophils Absolute: 4.3 10*3/uL (ref 1.4–7.0)
Neutrophils: 67 %
Platelets: 193 10*3/uL (ref 150–450)
RBC: 5.04 x10E6/uL (ref 4.14–5.80)
RDW: 12.5 % (ref 11.6–15.4)
WBC: 6.3 10*3/uL (ref 3.4–10.8)

## 2018-08-14 LAB — CMP14+EGFR
ALT: 28 IU/L (ref 0–44)
AST: 25 IU/L (ref 0–40)
Albumin/Globulin Ratio: 1.9 (ref 1.2–2.2)
Albumin: 4.4 g/dL (ref 3.8–4.8)
Alkaline Phosphatase: 73 IU/L (ref 39–117)
BUN/Creatinine Ratio: 22 (ref 10–24)
BUN: 20 mg/dL (ref 8–27)
Bilirubin Total: 0.4 mg/dL (ref 0.0–1.2)
CO2: 23 mmol/L (ref 20–29)
Calcium: 9.5 mg/dL (ref 8.6–10.2)
Chloride: 103 mmol/L (ref 96–106)
Creatinine, Ser: 0.9 mg/dL (ref 0.76–1.27)
GFR calc Af Amer: 100 mL/min/{1.73_m2} (ref >59)
GFR calc non Af Amer: 86 mL/min/{1.73_m2} (ref >59)
Globulin, Total: 2.3 g/dL (ref 1.5–4.5)
Glucose: 105 mg/dL — ABNORMAL HIGH (ref 65–99)
Potassium: 4.6 mmol/L (ref 3.5–5.2)
Sodium: 140 mmol/L (ref 134–144)
Total Protein: 6.7 g/dL (ref 6.0–8.5)

## 2018-08-14 LAB — LIPID PANEL
Chol/HDL Ratio: 3.9 ratio (ref 0.0–5.0)
Cholesterol, Total: 139 mg/dL (ref 100–199)
HDL: 36 mg/dL — ABNORMAL LOW (ref >39)
LDL Calculated: 64 mg/dL (ref 0–99)
Triglycerides: 194 mg/dL — ABNORMAL HIGH (ref 0–149)
VLDL Cholesterol Cal: 39 mg/dL (ref 5–40)

## 2018-08-14 LAB — TSH: TSH: 2.67 u[IU]/mL (ref 0.450–4.500)

## 2018-08-14 NOTE — Telephone Encounter (Signed)
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 Caryl Pina, MD [facility] 08/14/2018, 9:58 AM

## 2018-08-21 DIAGNOSIS — N401 Enlarged prostate with lower urinary tract symptoms: Secondary | ICD-10-CM | POA: Diagnosis not present

## 2018-08-21 DIAGNOSIS — R972 Elevated prostate specific antigen [PSA]: Secondary | ICD-10-CM | POA: Diagnosis not present

## 2018-08-21 DIAGNOSIS — R351 Nocturia: Secondary | ICD-10-CM | POA: Diagnosis not present

## 2018-09-10 DIAGNOSIS — M25551 Pain in right hip: Secondary | ICD-10-CM | POA: Diagnosis not present

## 2018-09-10 DIAGNOSIS — M1611 Unilateral primary osteoarthritis, right hip: Secondary | ICD-10-CM | POA: Diagnosis not present

## 2018-09-18 DIAGNOSIS — R351 Nocturia: Secondary | ICD-10-CM | POA: Diagnosis not present

## 2018-09-18 DIAGNOSIS — N5201 Erectile dysfunction due to arterial insufficiency: Secondary | ICD-10-CM | POA: Diagnosis not present

## 2018-09-18 DIAGNOSIS — R972 Elevated prostate specific antigen [PSA]: Secondary | ICD-10-CM | POA: Diagnosis not present

## 2018-09-18 DIAGNOSIS — N401 Enlarged prostate with lower urinary tract symptoms: Secondary | ICD-10-CM | POA: Diagnosis not present

## 2018-10-14 DIAGNOSIS — Z8679 Personal history of other diseases of the circulatory system: Secondary | ICD-10-CM | POA: Diagnosis not present

## 2018-10-14 DIAGNOSIS — Z1211 Encounter for screening for malignant neoplasm of colon: Secondary | ICD-10-CM | POA: Diagnosis not present

## 2018-10-16 ENCOUNTER — Telehealth: Payer: Self-pay | Admitting: *Deleted

## 2018-10-16 NOTE — Telephone Encounter (Signed)
   [facility] Pre-operative Risk Assessment    Request for surgical clearance:  1. What type of surgery is being performed? Colon Cancer Screening  2. When is this surgery scheduled? TBD in August pending Recommendations   3. What type of clearance is required (medical clearance vs. Pharmacy clearance to hold med vs. Both)? Both  4. Are there any medications that need to be held prior to surgery and how long? Plavix for 5 days prior to procedure  5. Practice name and name of physician performing surgery? [facility], Dr Alessandra Bevels  6. What is your office phone number [phone]    7.   What is your office fax number [phone]  8.   Anesthesia type (None, local, MAC, general) ? Propofol   Corey Duran 10/16/2018, 3:23 PM  _________________________________________________________________   (provider comments below)

## 2018-10-16 NOTE — Telephone Encounter (Signed)
Dr Irish Lack need clearance for this patient to stop Plavix- he had a DES in 2008.  Kerin Ransom PA-C 10/16/2018 4:06 PM

## 2018-10-16 NOTE — Telephone Encounter (Signed)
OK to hold Plavix 5 days prior to procedure.

## 2018-10-19 NOTE — Telephone Encounter (Signed)
   Primary Cardiologist: No primary care provider on file.  Chart reviewed as part of pre-operative protocol coverage. Patient was contacted 10/19/2018 in reference to pre-operative risk assessment for pending surgery as outlined below.  Corey Duran was last seen on 03/20/2019 by Dr. Irish Lack. Spoke with patient on the phone who states he has been doing well from a cardiac perspective. He denies chest pain or other anginal symptoms. He has not needed SL NTG and has been compliant with all other medications.  Dr. Irish Lack recommends holding Plavix for 5 days prior to procedure as requested then resuming as soon as possible per procedure team.   Therefore, based on ACC/AHA guidelines, the patient would be at acceptable risk for the planned procedure without further cardiovascular testing.   I will route this recommendation to the requesting party via Epic fax function and remove from pre-op pool.  Please call with questions.  Kathyrn Drown, NP 10/19/2018, 9:15 AM

## 2018-11-02 ENCOUNTER — Telehealth: Payer: Self-pay | Admitting: *Deleted

## 2018-11-02 NOTE — Telephone Encounter (Signed)
   [facility] Pre-operative Risk Assessment    Request for surgical clearance:  1. What type of surgery is being performed? COLONOSCOPY  2. When is this surgery scheduled? TBD   3. What type of clearance is required (medical clearance vs. Pharmacy clearance to hold med vs. Both)? MEDICAL  4. Are there any medications that need to be held prior to surgery and how long? PLAVIX X 5 DAYS PRIOR  5. Practice name and name of physician performing surgery? [facility]; DR. Alessandra Bevels  6. What is your office phone number [phone]   7.   What is your office fax number [phone]  8.   Anesthesia type (None, local, MAC, general) ? PROPOFOL   Julaine Hua 11/02/2018, 5:00 PM  _________________________________________________________________   (provider comments below)

## 2018-11-03 NOTE — Telephone Encounter (Signed)
This was sent 10/16/18 I have resent.

## 2018-11-13 ENCOUNTER — Other Ambulatory Visit: Payer: Self-pay

## 2018-11-16 ENCOUNTER — Other Ambulatory Visit: Payer: Self-pay

## 2018-11-16 ENCOUNTER — Encounter: Payer: Self-pay | Admitting: Family Medicine

## 2018-11-16 ENCOUNTER — Ambulatory Visit (INDEPENDENT_AMBULATORY_CARE_PROVIDER_SITE_OTHER): Payer: Medicare Other | Admitting: Family Medicine

## 2018-11-16 VITALS — BP 124/80 | HR 50 | Temp 98.2°F | Ht 66.0 in | Wt 178.0 lb

## 2018-11-16 DIAGNOSIS — Z23 Encounter for immunization: Secondary | ICD-10-CM | POA: Diagnosis not present

## 2018-11-16 DIAGNOSIS — R739 Hyperglycemia, unspecified: Secondary | ICD-10-CM

## 2018-11-16 DIAGNOSIS — E7800 Pure hypercholesterolemia, unspecified: Secondary | ICD-10-CM | POA: Diagnosis not present

## 2018-11-16 DIAGNOSIS — I10 Essential (primary) hypertension: Secondary | ICD-10-CM

## 2018-11-16 DIAGNOSIS — E039 Hypothyroidism, unspecified: Secondary | ICD-10-CM

## 2018-11-16 LAB — BAYER DCA HB A1C WAIVED: HB A1C (BAYER DCA - WAIVED): 5.5 % (ref <7.0)

## 2018-11-16 NOTE — Progress Notes (Signed)
BP 124/80   Pulse (!) 50   Temp 98.2 F (36.8 C) (Temporal)   Ht 5' 6"  (1.676 m)   Wt 178 lb (80.7 kg)   BMI 28.73 kg/m    Subjective:   Patient ID: Corey Duran, male    DOB: Mar 14, 1951, 68 y.o.   MRN: [id number]  HPI: Corey Duran is a 68 y.o. male presenting on 11/16/2018 for Hypertension (3 month follow up) and Hyperlipidemia   HPI Hypertension Patient is currently on metoprolol, and their blood pressure today is 124/80. Patient denies any lightheadedness or dizziness. Patient denies headaches, blurred vision, chest pains, shortness of breath, or weakness. Denies any side effects from medication and is content with current medication.   Hypothyroidism recheck Patient is coming in for thyroid recheck today as well. They deny any issues with hair changes or heat or cold problems or diarrhea or constipation. They deny any chest pain or palpitations. They are currently on levothyroxine 69mcrograms   Hyperlipidemia Patient is coming in for recheck of his hyperlipidemia. The patient is currently taking Vytorin and omega-3. They deny any issues with myalgias or history of liver damage from it. They deny any focal numbness or weakness or chest pain.   Relevant past medical, surgical, family and social history reviewed and updated as indicated. Interim medical history since our last visit reviewed. Allergies and medications reviewed and updated.  Review of Systems  Constitutional: Negative for chills and fever.  Respiratory: Negative for shortness of breath and wheezing.   Cardiovascular: Negative for chest pain and leg swelling.  Musculoskeletal: Negative for back pain and gait problem.  Skin: Negative for rash.  Neurological: Negative for dizziness, weakness and numbness.  All other systems reviewed and are negative.   Per HPI unless specifically indicated above   Allergies as of 11/16/2018      Reactions   Other       Medication List       Accurate as of November 16, 2018 10:46 AM. If you have any questions, ask your nurse or doctor.        clopidogrel 75 MG tablet Commonly known as: PLAVIX TAKE 1 TABLET(75 MG) BY MOUTH DAILY   CoQ10 100 MG Caps Take 100 mg by mouth daily.   ezetimibe-simvastatin 10-80 MG tablet Commonly known as: VYTORIN Take 0.5 tablets by mouth daily. (Needs to be seen before next refill)   finasteride 5 MG tablet Commonly known as: PROSCAR Take 1 tablet (5 mg total) by mouth daily.   Fish Oil 1200 MG Caps Take 1,200 mg by mouth 2 (two) times daily.   levothyroxine 50 MCG tablet Commonly known as: SYNTHROID Take 1 tablet (50 mcg total) by mouth daily.   meloxicam 15 MG tablet Commonly known as: MOBIC Take 1 tablet (15 mg total) by mouth daily.   metoprolol tartrate 25 MG tablet Commonly known as: LOPRESSOR TAKE 1 TABLET(25 MG) BY MOUTH TWICE DAILY   MULTIVITAMIN PO Take 1 tablet by mouth daily.   nitroGLYCERIN 0.4 MG SL tablet Commonly known as: NITROSTAT Place 1 tablet (0.4 mg total) every 5 (five) minutes as needed under the tongue for chest pain.   tamsulosin 0.4 MG Caps capsule Commonly known as: FLOMAX Take 1 capsule (0.4 mg total) by mouth daily.   valACYclovir 1000 MG tablet Commonly known as: VALTREX Take 1 tablet (1,000 mg total) by mouth daily.        Objective:   BP 124/80   Pulse (!) 50  Temp 98.2 F (36.8 C) (Temporal)   Ht 5' 6"  (1.676 m)   Wt 178 lb (80.7 kg)   BMI 28.73 kg/m   Wt Readings from Last 3 Encounters:  11/16/18 178 lb (80.7 kg)  08/13/18 180 lb 3.2 oz (81.7 kg)  03/19/18 182 lb 6.4 oz (82.7 kg)    Physical Exam Vitals signs and nursing note reviewed.  Constitutional:      General: He is not in acute distress.    Appearance: He is well-developed. He is not diaphoretic.  Eyes:     General: No scleral icterus.    Conjunctiva/sclera: Conjunctivae normal.  Neck:     Musculoskeletal: Neck supple.     Thyroid: No thyromegaly.  Cardiovascular:     Rate and  Rhythm: Normal rate and regular rhythm.     Heart sounds: Normal heart sounds. No murmur.  Pulmonary:     Effort: Pulmonary effort is normal. No respiratory distress.     Breath sounds: Normal breath sounds. No wheezing.  Lymphadenopathy:     Cervical: No cervical adenopathy.  Skin:    General: Skin is warm and dry.     Findings: No rash.  Neurological:     Mental Status: He is alert and oriented to person, place, and time.     Coordination: Coordination normal.  Psychiatric:        Behavior: Behavior normal.       Assessment & Plan:   Problem List Items Addressed This Visit      Cardiovascular and Mediastinum   Essential hypertension, benign   Relevant Orders   CMP14+EGFR (Completed)     Endocrine   Hypothyroidism - Primary   Relevant Orders   TSH (Completed)     Other   Hypercholesteremia   Relevant Orders   Lipid panel (Completed)    Other Visit Diagnoses    Elevated blood sugar       Relevant Orders   Bayer DCA Hb A1c Waived (Completed)    Blood pressure medication for now and will monitor closely, if he develops any lightheadedness or dizziness then we may reconsider the metoprolol or back off on it.  Continue thyroid and check numbers today and continue cholesterol medications  Follow up plan: Return in about 6 months (around 05/19/2019), or if symptoms worsen or fail to improve, for Thyroid and hypertension and cholesterol recheck.  Counseling provided for all of the vaccine components No orders of the defined types were placed in this encounter.   Caryl Pina, MD [facility] 11/16/2018, 10:46 AM

## 2018-11-17 LAB — CMP14+EGFR
ALT: 24 IU/L (ref 0–44)
AST: 23 IU/L (ref 0–40)
Albumin/Globulin Ratio: 2.1 (ref 1.2–2.2)
Albumin: 4.2 g/dL (ref 3.8–4.8)
Alkaline Phosphatase: 59 IU/L (ref 39–117)
BUN/Creatinine Ratio: 21 (ref 10–24)
BUN: 21 mg/dL (ref 8–27)
Bilirubin Total: 0.4 mg/dL (ref 0.0–1.2)
CO2: 26 mmol/L (ref 20–29)
Calcium: 9.2 mg/dL (ref 8.6–10.2)
Chloride: 103 mmol/L (ref 96–106)
Creatinine, Ser: 0.99 mg/dL (ref 0.76–1.27)
GFR calc Af Amer: 89 mL/min/{1.73_m2} (ref >59)
GFR calc non Af Amer: 77 mL/min/{1.73_m2} (ref >59)
Globulin, Total: 2 g/dL (ref 1.5–4.5)
Glucose: 99 mg/dL (ref 65–99)
Potassium: 4.5 mmol/L (ref 3.5–5.2)
Sodium: 139 mmol/L (ref 134–144)
Total Protein: 6.2 g/dL (ref 6.0–8.5)

## 2018-11-17 LAB — TSH: TSH: 2.45 u[IU]/mL (ref 0.450–4.500)

## 2018-11-17 LAB — LIPID PANEL
Chol/HDL Ratio: 3.7 ratio (ref 0.0–5.0)
Cholesterol, Total: 136 mg/dL (ref 100–199)
HDL: 37 mg/dL — ABNORMAL LOW (ref >39)
LDL Calculated: 72 mg/dL (ref 0–99)
Triglycerides: 137 mg/dL (ref 0–149)
VLDL Cholesterol Cal: 27 mg/dL (ref 5–40)

## 2018-11-18 ENCOUNTER — Encounter: Payer: Self-pay | Admitting: Family Medicine

## 2018-11-18 DIAGNOSIS — D122 Benign neoplasm of ascending colon: Secondary | ICD-10-CM | POA: Diagnosis not present

## 2018-11-18 DIAGNOSIS — D128 Benign neoplasm of rectum: Secondary | ICD-10-CM | POA: Diagnosis not present

## 2018-11-18 DIAGNOSIS — K5730 Diverticulosis of large intestine without perforation or abscess without bleeding: Secondary | ICD-10-CM | POA: Diagnosis not present

## 2018-11-18 DIAGNOSIS — D123 Benign neoplasm of transverse colon: Secondary | ICD-10-CM | POA: Diagnosis not present

## 2018-11-18 DIAGNOSIS — D120 Benign neoplasm of cecum: Secondary | ICD-10-CM | POA: Diagnosis not present

## 2018-11-18 DIAGNOSIS — Z1211 Encounter for screening for malignant neoplasm of colon: Secondary | ICD-10-CM | POA: Diagnosis not present

## 2018-11-18 DIAGNOSIS — K635 Polyp of colon: Secondary | ICD-10-CM | POA: Diagnosis not present

## 2018-11-20 DIAGNOSIS — K635 Polyp of colon: Secondary | ICD-10-CM | POA: Diagnosis not present

## 2018-11-20 DIAGNOSIS — D122 Benign neoplasm of ascending colon: Secondary | ICD-10-CM | POA: Diagnosis not present

## 2018-11-20 DIAGNOSIS — D120 Benign neoplasm of cecum: Secondary | ICD-10-CM | POA: Diagnosis not present

## 2018-11-20 DIAGNOSIS — D123 Benign neoplasm of transverse colon: Secondary | ICD-10-CM | POA: Diagnosis not present

## 2019-01-01 ENCOUNTER — Other Ambulatory Visit: Payer: Self-pay | Admitting: Family Medicine

## 2019-01-01 DIAGNOSIS — M1611 Unilateral primary osteoarthritis, right hip: Secondary | ICD-10-CM

## 2019-01-19 DIAGNOSIS — H2513 Age-related nuclear cataract, bilateral: Secondary | ICD-10-CM | POA: Diagnosis not present

## 2019-01-19 DIAGNOSIS — H40013 Open angle with borderline findings, low risk, bilateral: Secondary | ICD-10-CM | POA: Diagnosis not present

## 2019-02-04 ENCOUNTER — Other Ambulatory Visit: Payer: Self-pay

## 2019-02-05 ENCOUNTER — Ambulatory Visit (INDEPENDENT_AMBULATORY_CARE_PROVIDER_SITE_OTHER): Payer: Medicare Other | Admitting: *Deleted

## 2019-02-05 DIAGNOSIS — Z23 Encounter for immunization: Secondary | ICD-10-CM | POA: Diagnosis not present

## 2019-02-18 DIAGNOSIS — L821 Other seborrheic keratosis: Secondary | ICD-10-CM | POA: Diagnosis not present

## 2019-02-18 DIAGNOSIS — M720 Palmar fascial fibromatosis [Dupuytren]: Secondary | ICD-10-CM | POA: Diagnosis not present

## 2019-02-18 DIAGNOSIS — D2261 Melanocytic nevi of right upper limb, including shoulder: Secondary | ICD-10-CM | POA: Diagnosis not present

## 2019-02-18 DIAGNOSIS — Z85828 Personal history of other malignant neoplasm of skin: Secondary | ICD-10-CM | POA: Diagnosis not present

## 2019-02-18 DIAGNOSIS — L570 Actinic keratosis: Secondary | ICD-10-CM | POA: Diagnosis not present

## 2019-03-19 DIAGNOSIS — R3915 Urgency of urination: Secondary | ICD-10-CM | POA: Diagnosis not present

## 2019-03-19 DIAGNOSIS — N401 Enlarged prostate with lower urinary tract symptoms: Secondary | ICD-10-CM | POA: Diagnosis not present

## 2019-04-04 NOTE — Progress Notes (Signed)
Cardiology Office Note   Date:  04/06/2019   ID:  Corey Duran, DOB Jun 26, 1950, MRN [id number]  PCP:  Dettinger, Fransisca Kaufmann, MD    No chief complaint on file.  CAD  Wt Readings from Last 3 Encounters:  04/06/19 182 lb 3.2 oz (82.6 kg)  11/16/18 178 lb (80.7 kg)  08/13/18 180 lb 3.2 oz (81.7 kg)       History of Present Illness: Corey Duran is a 69 y.o. male   who has had CAD. He had an LAD stent ( 3.5 x 16 DES ( [facility] study)) in 2008. He had been on CPAP in the past but stopped it due to the inconvenience. He sleeps better with the CPAP, but it was not worth the hassle. He plays golf, dances for exercise.  He has lost some weightin 2017through diet control. He lost more in 2017.  Stress test in Jan 2020 showed:  Blood pressure demonstrated a normal response to exercise.  There was no ST segment deviation noted during stress.   ETT with good exercise tolerance (9:00); no chest pain; normal BP response; no ST changes; negative adequate ETT; Duke treadmill score 9.  Since the last visit, he has felt well.    Denies : Chest pain. Dizziness. Leg edema. Nitroglycerin use. Orthopnea. Palpitations. Paroxysmal nocturnal dyspnea. Shortness of breath. Syncope.   He is trying to exercise.  He rides a bike and walks.     Past Medical History:  Diagnosis Date  . BPH (benign prostatic hypertrophy)    followed by Dr. Reece Agar in the past; Now Dr. Diona Fanti  . Coronary atherosclerosis of native coronary artery    05/2006, stent LAD, patent 03/2007, study stent  . CTS (carpal tunnel syndrome)    1993, 2002  . GERD (gastroesophageal reflux disease)   . Hypercholesteremia   . Insomnia   . Laceration of leg    severe, 2009  . LBP (low back pain)   . OSA (obstructive sleep apnea)    PSG 07/23/10 ESS 11, AHI 16/hr REM 18/hr, RDI 17/hr REM 20/hr, O2 min 87%; CPAP 08/22/10 CPAP 10 with AHI 0  . Tenosynovitis of finger    left third finger    Past Surgical History:   Procedure Laterality Date  . ROTATOR CUFF REPAIR Right 0918/2018     Current Outpatient Medications  Medication Sig Dispense Refill  . clopidogrel (PLAVIX) 75 MG tablet TAKE 1 TABLET(75 MG) BY MOUTH DAILY 90 tablet 3  . Coenzyme Q10 (COQ10) 100 MG CAPS Take 100 mg by mouth daily.    Marland Kitchen ezetimibe-simvastatin (VYTORIN) 10-80 MG tablet Take 0.5 tablets by mouth daily. (Needs to be seen before next refill) 45 tablet 3  . finasteride (PROSCAR) 5 MG tablet Take 1 tablet (5 mg total) by mouth daily. 90 tablet 3  . levothyroxine (SYNTHROID) 50 MCG tablet Take 1 tablet (50 mcg total) by mouth daily. 90 tablet 3  . meloxicam (MOBIC) 15 MG tablet TAKE 1 TABLET(15 MG) BY MOUTH DAILY 90 tablet 1  . metoprolol tartrate (LOPRESSOR) 25 MG tablet TAKE 1 TABLET(25 MG) BY MOUTH TWICE DAILY 180 tablet 3  . Multiple Vitamins-Minerals (MULTIVITAMIN PO) Take 1 tablet by mouth daily.    . nitroGLYCERIN (NITROSTAT) 0.4 MG SL tablet Place 1 tablet (0.4 mg total) every 5 (five) minutes as needed under the tongue for chest pain. 25 tablet 3  . Omega-3 Fatty Acids (FISH OIL) 1200 MG CAPS Take 1,200 mg by mouth 2 (two)  times daily.    . tamsulosin (FLOMAX) 0.4 MG CAPS capsule Take 1 capsule (0.4 mg total) by mouth daily. 90 capsule 3  . valACYclovir (VALTREX) 1000 MG tablet Take 1 tablet (1,000 mg total) by mouth daily. 90 tablet 1   No current facility-administered medications for this visit.    Allergies:   Other    Social History:  The patient  reports that he has quit smoking. He has quit using smokeless tobacco. He reports current alcohol use. He reports that he does not use drugs.   Family History:  The patient's family history includes CAD in his father; Dementia in his mother; Heart attack in his father; Hypertension in his father; Ulcers in his father.    ROS:  Please see the history of present illness.   Otherwise, review of systems are positive for sleep apnea; occasionally "cranky."  All other systems  are reviewed and negative.    PHYSICAL EXAM: VS:  BP 132/78   Pulse (!) 54   Ht 5\' 6"  (1.676 m)   Wt 182 lb 3.2 oz (82.6 kg)   SpO2 97%   BMI 29.41 kg/m  , BMI Body mass index is 29.41 kg/m. GEN: Well nourished, well developed, in no acute distress  HEENT: normal  Neck: no JVD, carotid bruits, or masses Cardiac: RRR; no murmurs, rubs, or gallops,no edema  Respiratory:  clear to auscultation bilaterally, normal work of breathing GI: soft, nontender, nondistended, + BS MS: no deformity or atrophy  Skin: warm and dry, no rash Neuro:  Strength and sensation are intact Psych: euthymic mood, full affect   EKG:   The ekg ordered today demonstrates normal ECG   Recent Labs: 08/13/2018: Hemoglobin 16.1; Platelets 193 11/16/2018: ALT 24; BUN 21; Creatinine, Ser 0.99; Potassium 4.5; Sodium 139; TSH 2.450   Lipid Panel    Component Value Date/Time   CHOL 136 11/16/2018 1114   TRIG 137 11/16/2018 1114   HDL 37 (L) 11/16/2018 1114   CHOLHDL 3.7 11/16/2018 1114   CHOLHDL 4.3 01/24/2015 1147   VLDL 32 (H) 01/24/2015 1147   LDLCALC 72 11/16/2018 1114   LDLDIRECT 70.4 03/29/2014 1141     Other studies Reviewed: Additional studies/ records that were reviewed today with results demonstrating: labs reviewed.   ASSESSMENT AND PLAN:  1.   CAD: No angina on medical therapy.  Continue aggressive secondary prevention.  Normal stress test in 2020.  COuld stop metoprolol if he wants to try given his question of meds causing his occasional "crankiness."  I doubt it is related to metoprolol since he has been on this a long time and it is a very low dose. 2.   Hyperlipidemia: The current medical regimen is effective;  continue present plan and medications. 3. HTN: The current medical regimen is effective;  continue present plan and medications. 4. OSA: Given his prostate issues, it was too inconvenient.  Got up 6 x/ night.  5. BPH: Planning for a TURP.  6. Preoperative cardiovascular exam: No  further testing needed before TURP.     Current medicines are reviewed at length with the patient today.  The patient concerns regarding his medicines were addressed.  The following changes have been made:  No change  Labs/ tests ordered today include:  No orders of the defined types were placed in this encounter.   Recommend 150 minutes/week of aerobic exercise Low fat, low carb, high fiber diet recommended  Disposition:   FU in 1 year   Signed,  Larae Grooms, MD  04/06/2019 9:22 AM    [facility] [place], [place], [place]  [address] Phone: [phone]; Fax: [phone]

## 2019-04-06 ENCOUNTER — Encounter: Payer: Self-pay | Admitting: Interventional Cardiology

## 2019-04-06 ENCOUNTER — Ambulatory Visit: Payer: Medicare Other | Admitting: Interventional Cardiology

## 2019-04-06 ENCOUNTER — Other Ambulatory Visit: Payer: Self-pay

## 2019-04-06 VITALS — BP 132/78 | HR 54 | Ht 66.0 in | Wt 182.2 lb

## 2019-04-06 DIAGNOSIS — G4733 Obstructive sleep apnea (adult) (pediatric): Secondary | ICD-10-CM

## 2019-04-06 DIAGNOSIS — I2510 Atherosclerotic heart disease of native coronary artery without angina pectoris: Secondary | ICD-10-CM | POA: Diagnosis not present

## 2019-04-06 DIAGNOSIS — I10 Essential (primary) hypertension: Secondary | ICD-10-CM

## 2019-04-06 DIAGNOSIS — E7800 Pure hypercholesterolemia, unspecified: Secondary | ICD-10-CM

## 2019-04-06 MED ORDER — NITROGLYCERIN 0.4 MG SL SUBL: 0.4000 mg | SUBLINGUAL_TABLET | SUBLINGUAL | 3 refills | Status: DC | PRN

## 2019-04-06 NOTE — Patient Instructions (Signed)
Medication Instructions:  Your physician recommends that you continue on your current medications as directed. Please refer to the Current Medication list given to you today.  *If you need a refill on your cardiac medications before your next appointment, please call your pharmacy*  Lab Work: None ordered  If you have labs (blood work) drawn today and your tests are completely normal, you will receive your results only by: Marland Kitchen MyChart Message (if you have MyChart) OR . A paper copy in the mail If you have any lab test that is abnormal or we need to change your treatment, we will call you to review the results.  Testing/Procedures: None ordred  Follow-Up: At [facility], you and your health needs are our priority.  As part of our continuing mission to provide you with exceptional heart care, we have created designated Provider Care Teams.  These Care Teams include your primary Cardiologist (physician) and Advanced Practice Providers (APPs -  Physician Assistants and Nurse Practitioners) who all work together to provide you with the care you need, when you need it.  Your next appointment:   12 month(s)  The format for your next appointment:   In Person  Provider:   You may see Larae Grooms, MD or one of the following Advanced Practice Providers on your designated Care Team:    Melina Copa, PA-C  Ermalinda Barrios, PA-C   Other Instructions

## 2019-05-19 ENCOUNTER — Ambulatory Visit: Payer: Medicare Other | Admitting: Family Medicine

## 2019-06-11 DIAGNOSIS — M25551 Pain in right hip: Secondary | ICD-10-CM | POA: Diagnosis not present

## 2019-06-28 ENCOUNTER — Encounter: Payer: Self-pay | Admitting: Family Medicine

## 2019-06-28 ENCOUNTER — Other Ambulatory Visit: Payer: Self-pay

## 2019-06-28 ENCOUNTER — Ambulatory Visit (INDEPENDENT_AMBULATORY_CARE_PROVIDER_SITE_OTHER): Payer: Medicare Other | Admitting: Family Medicine

## 2019-06-28 VITALS — BP 112/72 | HR 52 | Temp 98.9°F | Ht 66.0 in | Wt 175.0 lb

## 2019-06-28 DIAGNOSIS — E7800 Pure hypercholesterolemia, unspecified: Secondary | ICD-10-CM | POA: Diagnosis not present

## 2019-06-28 DIAGNOSIS — I10 Essential (primary) hypertension: Secondary | ICD-10-CM

## 2019-06-28 DIAGNOSIS — E039 Hypothyroidism, unspecified: Secondary | ICD-10-CM | POA: Diagnosis not present

## 2019-06-28 NOTE — Progress Notes (Signed)
BP 112/72   Pulse (!) 52   Temp 98.9 F (37.2 C)   Ht _0  (1.676 m)   Wt 175 lb (79.4 kg)   SpO2 95%   BMI 28.25 kg/m    Subjective:   Patient ID: Corey Duran, male    DOB: December 11, 1950, 69 y.o.   MRN: [id number]  HPI: Corey Duran is a 70 y.o. male presenting on 06/28/2019 for Medical Management of Chronic Issues and Hypothyroidism   HPI Hypertension and CAD Patient is currently on metoprolol, and their blood pressure today is 112/72. Patient denies any lightheadedness or dizziness. Patient denies headaches, blurred vision, chest pains, shortness of breath, or weakness. Denies any side effects from medication and is content with current medication.   Hypothyroidism recheck Patient is coming in for thyroid recheck today as well. They deny any issues with hair changes or heat or cold problems or diarrhea or constipation. They deny any chest pain or palpitations. They are currently on levothyroxine 50 micrograms   Hyperlipidemia Patient is coming in for recheck of his hyperlipidemia. The patient is currently taking omega-3. They deny any issues with myalgias or history of liver damage from it. They deny any focal numbness or weakness or chest pain.   Patient is coming in today also discussing that is been more irritable sent off and more agitated over this past year.  He says a lot of it has been because has been less active and stuck at home and has right hip pain and cannot do the exercises that he has been doing and he says that he just gets more upset easier and his girlfriend has noticed that and wanted to bring it up.  We discussed many options and he would like to try and do physical exercise and activity and try to increase that first and if not we will discuss counseling or medication in the future.  Relevant past medical, surgical, family and social history reviewed and updated as indicated. Interim medical history since our last visit reviewed. Allergies and  medications reviewed and updated.  Review of Systems  Constitutional: Negative for chills and fever.  Eyes: Negative for discharge.  Respiratory: Negative for shortness of breath and wheezing.   Cardiovascular: Negative for chest pain and leg swelling.  Musculoskeletal: Negative for back pain and gait problem.  Skin: Negative for rash.  All other systems reviewed and are negative.   Per HPI unless specifically indicated above   Allergies as of 06/28/2019      Reactions   Other       Medication List       Accurate as of June 28, 2019  9:55 AM. If you have any questions, ask your nurse or doctor.        clopidogrel 75 MG tablet Commonly known as: PLAVIX TAKE 1 TABLET(75 MG) BY MOUTH DAILY   CoQ10 100 MG Caps Take 100 mg by mouth daily.   ezetimibe-simvastatin 10-80 MG tablet Commonly known as: VYTORIN Take 0.5 tablets by mouth daily. (Needs to be seen before next refill)   finasteride 5 MG tablet Commonly known as: PROSCAR Take 1 tablet (5 mg total) by mouth daily.   Fish Oil 1200 MG Caps Take 1,200 mg by mouth 2 (two) times daily.   levothyroxine 50 MCG tablet Commonly known as: SYNTHROID Take 1 tablet (50 mcg total) by mouth daily.   meloxicam 15 MG tablet Commonly known as: MOBIC TAKE 1 TABLET(15 MG) BY MOUTH DAILY  metoprolol tartrate 25 MG tablet Commonly known as: LOPRESSOR TAKE 1 TABLET(25 MG) BY MOUTH TWICE DAILY   MULTIVITAMIN PO Take 1 tablet by mouth daily.   nitroGLYCERIN 0.4 MG SL tablet Commonly known as: NITROSTAT Place 1 tablet (0.4 mg total) under the tongue every 5 (five) minutes as needed for chest pain.   tamsulosin 0.4 MG Caps capsule Commonly known as: FLOMAX Take 1 capsule (0.4 mg total) by mouth daily.   valACYclovir 1000 MG tablet Commonly known as: VALTREX Take 1 tablet (1,000 mg total) by mouth daily.        Objective:   BP 112/72   Pulse (!) 52   Temp 98.9 F (37.2 C)   Ht _0  (1.676 m)   Wt 175 lb (79.4  kg)   SpO2 95%   BMI 28.25 kg/m   Wt Readings from Last 3 Encounters:  06/28/19 175 lb (79.4 kg)  04/06/19 182 lb 3.2 oz (82.6 kg)  11/16/18 178 lb (80.7 kg)    Physical Exam Vitals and nursing note reviewed.  Constitutional:      General: He is not in acute distress.    Appearance: He is well-developed. He is not diaphoretic.  Eyes:     General: No scleral icterus.    Conjunctiva/sclera: Conjunctivae normal.  Neck:     Thyroid: No thyromegaly.  Cardiovascular:     Rate and Rhythm: Normal rate and regular rhythm.     Heart sounds: Normal heart sounds. No murmur.  Pulmonary:     Effort: Pulmonary effort is normal. No respiratory distress.     Breath sounds: Normal breath sounds. No wheezing.  Musculoskeletal:        General: Normal range of motion.     Cervical back: Neck supple.  Lymphadenopathy:     Cervical: No cervical adenopathy.  Skin:    General: Skin is warm and dry.     Findings: No rash.  Neurological:     Mental Status: He is alert and oriented to person, place, and time.     Coordination: Coordination normal.  Psychiatric:        Mood and Affect: Mood is anxious. Mood is not depressed.        Behavior: Behavior normal.        Thought Content: Thought content does not include suicidal ideation. Thought content does not include suicidal plan.       Assessment & Plan:   Problem List Items Addressed This Visit      Cardiovascular and Mediastinum   Essential hypertension, benign   Relevant Orders   Lipid panel   CMP14+EGFR   CBC with Differential/Platelet     Endocrine   Hypothyroidism - Primary   Relevant Orders   TSH     Other   Hypercholesteremia      Will check blood work, continue current medication, no changes. Follow up plan: Return in about 6 months (around 12/29/2019), or if symptoms worsen or fail to improve, for Thyroid and hypertension and cholesterol recheck.  Counseling provided for all of the vaccine components Orders Placed  This Encounter  Procedures  . TSH  . Lipid panel  . CMP14+EGFR  . CBC with Differential/Platelet    Caryl Pina, MD [facility] 06/28/2019, 9:55 AM

## 2019-06-29 LAB — CMP14+EGFR
ALT: 31 IU/L (ref 0–44)
AST: 23 IU/L (ref 0–40)
Albumin/Globulin Ratio: 2 (ref 1.2–2.2)
Albumin: 4.3 g/dL (ref 3.7–4.7)
Alkaline Phosphatase: 64 IU/L (ref 39–117)
BUN/Creatinine Ratio: 28 — ABNORMAL HIGH (ref 10–24)
BUN: 26 mg/dL (ref 8–27)
Bilirubin Total: 0.6 mg/dL (ref 0.0–1.2)
CO2: 23 mmol/L (ref 20–29)
Calcium: 9.3 mg/dL (ref 8.6–10.2)
Chloride: 103 mmol/L (ref 96–106)
Creatinine, Ser: 0.92 mg/dL (ref 0.76–1.27)
GFR calc Af Amer: 96 mL/min/{1.73_m2} (ref >59)
GFR calc non Af Amer: 83 mL/min/{1.73_m2} (ref >59)
Globulin, Total: 2.1 g/dL (ref 1.5–4.5)
Glucose: 101 mg/dL — ABNORMAL HIGH (ref 65–99)
Potassium: 4.5 mmol/L (ref 3.5–5.2)
Sodium: 139 mmol/L (ref 134–144)
Total Protein: 6.4 g/dL (ref 6.0–8.5)

## 2019-06-29 LAB — CBC WITH DIFFERENTIAL/PLATELET
Basophils Absolute: 0 10*3/uL (ref 0.0–0.2)
Basos: 0 %
EOS (ABSOLUTE): 0.1 10*3/uL (ref 0.0–0.4)
Eos: 2 %
Hematocrit: 48.9 % (ref 37.5–51.0)
Hemoglobin: 16.4 g/dL (ref 13.0–17.7)
Immature Grans (Abs): 0 10*3/uL (ref 0.0–0.1)
Immature Granulocytes: 1 %
Lymphocytes Absolute: 1.5 10*3/uL (ref 0.7–3.1)
Lymphs: 23 %
MCH: 31.5 pg (ref 26.6–33.0)
MCHC: 33.5 g/dL (ref 31.5–35.7)
MCV: 94 fL (ref 79–97)
Monocytes Absolute: 0.4 10*3/uL (ref 0.1–0.9)
Monocytes: 6 %
Neutrophils Absolute: 4.7 10*3/uL (ref 1.4–7.0)
Neutrophils: 68 %
Platelets: 197 10*3/uL (ref 150–450)
RBC: 5.2 x10E6/uL (ref 4.14–5.80)
RDW: 12.2 % (ref 11.6–15.4)
WBC: 6.8 10*3/uL (ref 3.4–10.8)

## 2019-06-29 LAB — LIPID PANEL
Chol/HDL Ratio: 3.7 ratio (ref 0.0–5.0)
Cholesterol, Total: 151 mg/dL (ref 100–199)
HDL: 41 mg/dL (ref >39)
LDL Chol Calc (NIH): 83 mg/dL (ref 0–99)
Triglycerides: 155 mg/dL — ABNORMAL HIGH (ref 0–149)
VLDL Cholesterol Cal: 27 mg/dL (ref 5–40)

## 2019-06-29 LAB — TSH: TSH: 2.88 u[IU]/mL (ref 0.450–4.500)

## 2019-07-14 DIAGNOSIS — M25551 Pain in right hip: Secondary | ICD-10-CM | POA: Diagnosis not present

## 2019-08-12 ENCOUNTER — Other Ambulatory Visit: Payer: Self-pay | Admitting: Family Medicine

## 2019-08-12 DIAGNOSIS — E039 Hypothyroidism, unspecified: Secondary | ICD-10-CM

## 2019-08-12 DIAGNOSIS — M1611 Unilateral primary osteoarthritis, right hip: Secondary | ICD-10-CM

## 2019-08-17 ENCOUNTER — Other Ambulatory Visit: Payer: Self-pay | Admitting: Family Medicine

## 2019-08-17 DIAGNOSIS — E7800 Pure hypercholesterolemia, unspecified: Secondary | ICD-10-CM

## 2019-08-18 ENCOUNTER — Encounter: Payer: Self-pay | Admitting: Family Medicine

## 2019-08-18 ENCOUNTER — Ambulatory Visit (INDEPENDENT_AMBULATORY_CARE_PROVIDER_SITE_OTHER): Payer: Medicare Other | Admitting: Family Medicine

## 2019-08-18 DIAGNOSIS — A6000 Herpesviral infection of urogenital system, unspecified: Secondary | ICD-10-CM | POA: Diagnosis not present

## 2019-08-18 DIAGNOSIS — M1611 Unilateral primary osteoarthritis, right hip: Secondary | ICD-10-CM

## 2019-08-18 MED ORDER — VALACYCLOVIR HCL 1 G PO TABS: 1000.0000 mg | ORAL_TABLET | Freq: Every day | ORAL | 1 refills | Status: DC

## 2019-08-18 NOTE — Progress Notes (Signed)
Virtual Visit via telephone Note  I connected with Corey Duran on 08/18/19 at 1333 by telephone and verified that I am speaking with the correct person using two identifiers. Corey Duran is currently located at home and no other people are currently with her during visit. The provider, Fransisca Kaufmann Khylon Davies, MD is located in their office at time of visit.  Call ended at 1343  I discussed the limitations, risks, security and privacy concerns of performing an evaluation and management service by telephone and the availability of in person appointments. I also discussed with the patient that there may be a patient responsible charge related to this service. The patient expressed understanding and agreed to proceed.   History and Present Illness: Patient is calling in for refills for genital herpes. He keeps it on hand and only takes a few and the sore will goes away.  He gets these about once per month. He denies the sores becoming anything big.   He has started having hip pain again and has been doing Corey Duran and [place] Chi and that is helping. He has been seeing orthopedics  1. Genital herpes simplex, unspecified site   2. Primary osteoarthritis of right hip     Outpatient Encounter Medications as of 08/18/2019  Medication Sig  . clopidogrel (PLAVIX) 75 MG tablet TAKE 1 TABLET(75 MG) BY MOUTH DAILY  . Coenzyme Q10 (COQ10) 100 MG CAPS Take 100 mg by mouth daily.  Marland Kitchen ezetimibe-simvastatin (VYTORIN) 10-80 MG tablet Take 0.5 tablets by mouth daily.  . finasteride (PROSCAR) 5 MG tablet Take 1 tablet (5 mg total) by mouth daily.  Marland Kitchen levothyroxine (SYNTHROID) 50 MCG tablet TAKE 1 TABLET BY MOUTH DAILY  . meloxicam (MOBIC) 15 MG tablet TAKE 1 TABLET(15 MG) BY MOUTH DAILY  . metoprolol tartrate (LOPRESSOR) 25 MG tablet TAKE 1 TABLET(25 MG) BY MOUTH TWICE DAILY  . Multiple Vitamins-Minerals (MULTIVITAMIN PO) Take 1 tablet by mouth daily.  . nitroGLYCERIN (NITROSTAT) 0.4 MG SL tablet Place 1 tablet (0.4 mg  total) under the tongue every 5 (five) minutes as needed for chest pain.  . Omega-3 Fatty Acids (FISH OIL) 1200 MG CAPS Take 1,200 mg by mouth 2 (two) times daily.  . tamsulosin (FLOMAX) 0.4 MG CAPS capsule Take 1 capsule (0.4 mg total) by mouth daily.  . valACYclovir (VALTREX) 1000 MG tablet Take 1 tablet (1,000 mg total) by mouth daily.  . [DISCONTINUED] valACYclovir (VALTREX) 1000 MG tablet Take 1 tablet (1,000 mg total) by mouth daily.   No facility-administered encounter medications on file as of 08/18/2019.    Review of Systems  Constitutional: Negative for chills and fever.  Eyes: Negative for visual disturbance.  Respiratory: Negative for shortness of breath and wheezing.   Cardiovascular: Negative for chest pain and leg swelling.  Genitourinary: Positive for genital sores.  Musculoskeletal: Positive for arthralgias. Negative for back pain and gait problem.  Skin: Negative for rash.  All other systems reviewed and are negative.   Observations/Objective: Patient sounds comfortable and in no acute distress.  Assessment and Plan: Problem List Items Addressed This Visit      Genitourinary   Genital herpes - Primary   Relevant Medications   valACYclovir (VALTREX) 1000 MG tablet    Other Visit Diagnoses    Primary osteoarthritis of right hip          Refill herpes medicine Follow up plan: Return if symptoms worsen or fail to improve.     I discussed the assessment and treatment plan  with the patient. The patient was provided an opportunity to ask questions and all were answered. The patient agreed with the plan and demonstrated an understanding of the instructions.   The patient was advised to call back or seek an in-person evaluation if the symptoms worsen or if the condition fails to improve as anticipated.  The above assessment and management plan was discussed with the patient. The patient verbalized understanding of and has agreed to the management plan. Patient is  aware to call the clinic if symptoms persist or worsen. Patient is aware when to return to the clinic for a follow-up visit. Patient educated on when it is appropriate to go to the emergency department.    I provided 10 minutes of non-face-to-face time during this encounter.    Corey Rancher, MD

## 2019-09-24 ENCOUNTER — Ambulatory Visit (INDEPENDENT_AMBULATORY_CARE_PROVIDER_SITE_OTHER): Payer: Medicare Other | Admitting: Physician Assistant

## 2019-09-24 ENCOUNTER — Other Ambulatory Visit: Payer: Self-pay

## 2019-09-24 ENCOUNTER — Encounter: Payer: Self-pay | Admitting: Physician Assistant

## 2019-09-24 VITALS — BP 130/82 | HR 61 | Temp 97.5°F | Ht 66.0 in | Wt 177.2 lb

## 2019-09-24 DIAGNOSIS — L723 Sebaceous cyst: Secondary | ICD-10-CM

## 2019-09-24 DIAGNOSIS — Z029 Encounter for administrative examinations, unspecified: Secondary | ICD-10-CM

## 2019-09-24 NOTE — Patient Instructions (Signed)
Epidermal Cyst  An epidermal cyst is a sac made of skin tissue. The sac contains a substance called keratin. Keratin is a protein that is normally secreted through the hair follicles. When keratin becomes trapped in the top layer of skin (epidermis), it can form an epidermal cyst. Epidermal cysts can be found anywhere on your body. These cysts are usually harmless (benign), and they may not cause symptoms unless they become infected. What are the causes? This condition may be caused by:  A blocked hair follicle.  A hair that curls and re-enters the skin instead of growing straight out of the skin (ingrown hair).  A blocked pore.  Irritated skin.  An injury to the skin.  Certain conditions that are passed along from parent to child (inherited).  Human papillomavirus (HPV).  Long-term (chronic) sun damage to the skin. What increases the risk? The following factors may make you more likely to develop an epidermal cyst:  Having acne.  Being overweight.  Being 30-40 years old. What are the signs or symptoms? The only symptom of this condition may be a small, painless lump underneath the skin. When an epidermal cyst ruptures, it may become infected. Symptoms may include:  Redness.  Inflammation.  Tenderness.  Warmth.  Fever.  Keratin draining from the cyst. Keratin is grayish-Deemer, bad-smelling substance.  Pus draining from the cyst. How is this diagnosed? This condition is diagnosed with a physical exam.  In some cases, you may have a sample of tissue (biopsy) taken from your cyst to be examined under a microscope or tested for bacteria.  You may be referred to a health care provider who specializes in skin care (dermatologist). How is this treated? In many cases, epidermal cysts go away on their own without treatment. If a cyst becomes infected, treatment may include:  Opening and draining the cyst, done by a health care provider. After draining, minor surgery to  remove the rest of the cyst may be done.  Antibiotic medicine.  Injections of medicines (steroids) that help to reduce inflammation.  Surgery to remove the cyst. Surgery may be done if the cyst: ? Becomes large. ? Bothers you. ? Has a chance of turning into cancer.  Do not try to open a cyst yourself. Follow these instructions at home:  Take over-the-counter and prescription medicines only as told by your health care provider.  If you were prescribed an antibiotic medicine, take it it as told by your health care provider. Do not stop using the antibiotic even if you start to feel better.  Keep the area around your cyst clean and dry.  Wear loose, dry clothing.  Avoid touching your cyst.  Check your cyst every day for signs of infection. Check for: ? Redness, swelling, or pain. ? Fluid or blood. ? Warmth. ? Pus or a bad smell.  Keep all follow-up visits as told by your health care provider. This is important. How is this prevented?  Wear clean, dry, clothing.  Avoid wearing tight clothing.  Keep your skin clean and dry. Take showers or baths every day. Contact a health care provider if:  Your cyst develops symptoms of infection.  Your condition is not improving or is getting worse.  You develop a cyst that looks different from other cysts you have had.  You have a fever. Get help right away if:  Redness spreads from the cyst into the surrounding area. Summary  An epidermal cyst is a sac made of skin tissue. These cysts are   usually harmless (benign), and they may not cause symptoms unless they become infected.  If a cyst becomes infected, treatment may include surgery to open and drain the cyst, or to remove it. Treatment may also include medicines by mouth or through an injection.  Take over-the-counter and prescription medicines only as told by your health care provider. If you were prescribed an antibiotic medicine, take it as told by your health care  provider. Do not stop using the antibiotic even if you start to feel better.  Contact a health care provider if your condition is not improving or is getting worse.  Keep all follow-up visits as told by your health care provider. This is important. This information is not intended to replace advice given to you by your health care provider. Make sure you discuss any questions you have with your health care provider. Document Revised: 07/09/2018 Document Reviewed: 09/29/2017 Elsevier Patient Education  2020 Elsevier Inc.  

## 2019-09-24 NOTE — Progress Notes (Signed)
  Subjective:     Patient ID: Corey Duran, male   DOB: February 01, 1951, 69 y.o.   MRN: [id number]  HPI  Pt with tick removal early in June Does daily tick check Following removal noted "lump" to the L lateral thigh This was not where the tick was removed Sl pain to the area No meds for sx Review of Systems  Constitutional: Negative.   Skin: Negative for color change, rash and wound.       Objective:   Physical Exam Vitals and nursing note reviewed.  Constitutional:      General: He is not in acute distress.    Appearance: Normal appearance. He is not ill-appearing or toxic-appearing.  Neurological:     Mental Status: He is alert.   + 1x1cm mobile cystic lesion to the lateral L mid thigh No surrounding erythema, edema, or induration No area of fluct noted No surrounding ecchy     Assessment:     1. Sebaceous cyst        Plan:     Nl course reviewed Continue to observe for now F/U prn

## 2019-11-10 ENCOUNTER — Other Ambulatory Visit: Payer: Self-pay | Admitting: Family Medicine

## 2019-11-10 DIAGNOSIS — I10 Essential (primary) hypertension: Secondary | ICD-10-CM

## 2019-11-10 DIAGNOSIS — E7800 Pure hypercholesterolemia, unspecified: Secondary | ICD-10-CM

## 2019-11-10 DIAGNOSIS — R351 Nocturia: Secondary | ICD-10-CM

## 2019-11-10 DIAGNOSIS — N401 Enlarged prostate with lower urinary tract symptoms: Secondary | ICD-10-CM

## 2019-11-10 DIAGNOSIS — M1611 Unilateral primary osteoarthritis, right hip: Secondary | ICD-10-CM

## 2019-12-29 ENCOUNTER — Other Ambulatory Visit: Payer: Self-pay

## 2019-12-29 ENCOUNTER — Encounter: Payer: Self-pay | Admitting: Family Medicine

## 2019-12-29 ENCOUNTER — Ambulatory Visit (INDEPENDENT_AMBULATORY_CARE_PROVIDER_SITE_OTHER): Payer: Medicare Other | Admitting: Family Medicine

## 2019-12-29 VITALS — BP 137/82 | HR 53 | Temp 98.0°F | Ht 66.0 in | Wt 177.0 lb

## 2019-12-29 DIAGNOSIS — I10 Essential (primary) hypertension: Secondary | ICD-10-CM

## 2019-12-29 DIAGNOSIS — Z23 Encounter for immunization: Secondary | ICD-10-CM | POA: Diagnosis not present

## 2019-12-29 DIAGNOSIS — E039 Hypothyroidism, unspecified: Secondary | ICD-10-CM | POA: Diagnosis not present

## 2019-12-29 DIAGNOSIS — E7800 Pure hypercholesterolemia, unspecified: Secondary | ICD-10-CM | POA: Diagnosis not present

## 2019-12-29 NOTE — Addendum Note (Signed)
Addended by: Alphonzo Dublin on: 12/29/2019 11:33 AM   Modules accepted: Orders

## 2019-12-29 NOTE — Progress Notes (Signed)
BP 137/82   Pulse (!) 53   Temp 98 F (36.7 C)   Ht 5' 6"  (1.676 m)   Wt 177 lb (80.3 kg)   SpO2 97%   BMI 28.57 kg/m    Subjective:   Patient ID: Corey Duran, male    DOB: September 05, 1950, 69 y.o.   MRN: [id number]  HPI: Corey Duran is a 69 y.o. male presenting on 12/29/2019 for Medical Management of Chronic Issues, Hypertension, and Hypothyroidism   HPI Patient is coming in describing some left arm tingling and numbness that has been there about a month but seems to be improving over the past couple days.  He describes it as a burning and tingling pain that goes all the way down his arm, he has had some muscular tightness in the back of the shoulder but denies any neck pain.  He denies any weakness in that arm.  He says is improving so he wants to hold off on doing any testing or treatment at this point but will call back if it worsens.  Hypothyroidism recheck Patient is coming in for thyroid recheck today as well. They deny any issues with hair changes or heat or cold problems or diarrhea or constipation. They deny any chest pain or palpitations. They are currently on levothyroxine 50 micrograms   Hyperlipidemia Patient is coming in for recheck of his hyperlipidemia. The patient is currently taking fish oil and Vytorin. They deny any issues with myalgias or history of liver damage from it. They deny any focal numbness or weakness or chest pain.   Relevant past medical, surgical, family and social history reviewed and updated as indicated. Interim medical history since our last visit reviewed. Allergies and medications reviewed and updated.  Review of Systems  Constitutional: Negative for chills and fever.  Respiratory: Negative for shortness of breath and wheezing.   Cardiovascular: Negative for chest pain and leg swelling.  Musculoskeletal: Negative for back pain and gait problem.  Skin: Negative for rash.  Neurological: Positive for numbness. Negative for dizziness,  weakness and light-headedness.  All other systems reviewed and are negative.   Per HPI unless specifically indicated above   Allergies as of 12/29/2019      Reactions   Other       Medication List       Accurate as of December 29, 2019  9:55 AM. If you have any questions, ask your nurse or doctor.        clopidogrel 75 MG tablet Commonly known as: PLAVIX TAKE 1 TABLET(75 MG) BY MOUTH DAILY   CoQ10 100 MG Caps Take 100 mg by mouth daily.   ezetimibe-simvastatin 10-80 MG tablet Commonly known as: VYTORIN TAKE 1/2 TABLET BY MOUTH DAILY   finasteride 5 MG tablet Commonly known as: PROSCAR TAKE 1 TABLET(5 MG) BY MOUTH DAILY   Fish Oil 1200 MG Caps Take 1,200 mg by mouth 2 (two) times daily.   levothyroxine 50 MCG tablet Commonly known as: SYNTHROID TAKE 1 TABLET BY MOUTH DAILY   meloxicam 15 MG tablet Commonly known as: MOBIC TAKE 1 TABLET(15 MG) BY MOUTH DAILY   metoprolol tartrate 25 MG tablet Commonly known as: LOPRESSOR TAKE 1 TABLET(25 MG) BY MOUTH TWICE DAILY   MULTIVITAMIN PO Take 1 tablet by mouth daily.   nitroGLYCERIN 0.4 MG SL tablet Commonly known as: NITROSTAT Place 1 tablet (0.4 mg total) under the tongue every 5 (five) minutes as needed for chest pain.   tamsulosin 0.4 MG Caps  capsule Commonly known as: FLOMAX TAKE 1 CAPSULE(0.4 MG) BY MOUTH DAILY   valACYclovir 1000 MG tablet Commonly known as: VALTREX Take 1 tablet (1,000 mg total) by mouth daily.        Objective:   BP 137/82   Pulse (!) 53   Temp 98 F (36.7 C)   Ht 5' 6"  (1.676 m)   Wt 177 lb (80.3 kg)   SpO2 97%   BMI 28.57 kg/m   Wt Readings from Last 3 Encounters:  12/29/19 177 lb (80.3 kg)  09/24/19 177 lb 3.2 oz (80.4 kg)  06/28/19 175 lb (79.4 kg)    Physical Exam Vitals and nursing note reviewed.  Constitutional:      General: He is not in acute distress.    Appearance: He is well-developed. He is not diaphoretic.  Eyes:     General: No scleral icterus.     Conjunctiva/sclera: Conjunctivae normal.  Neck:     Thyroid: No thyromegaly.  Cardiovascular:     Rate and Rhythm: Normal rate and regular rhythm.     Heart sounds: Normal heart sounds. No murmur heard.   Pulmonary:     Effort: Pulmonary effort is normal. No respiratory distress.     Breath sounds: Normal breath sounds. No wheezing.  Musculoskeletal:        General: Normal range of motion.     Cervical back: Neck supple.  Lymphadenopathy:     Cervical: No cervical adenopathy.  Skin:    General: Skin is warm and dry.     Findings: No rash.  Neurological:     Mental Status: He is alert and oriented to person, place, and time.     Coordination: Coordination normal.  Psychiatric:        Behavior: Behavior normal.       Assessment & Plan:   Problem List Items Addressed This Visit      Cardiovascular and Mediastinum   Essential hypertension, benign   Relevant Orders   CMP14+EGFR     Endocrine   Hypothyroidism - Primary   Relevant Orders   TSH     Other   Hypercholesteremia   Relevant Orders   Lipid panel      Continue current medication, no change, will check blood work today.  Patient seems to be doing well. Follow up plan: Return in about 6 months (around 06/27/2020), or if symptoms worsen or fail to improve, for 92-monthrecheck for thyroid.  Counseling provided for all of the vaccine components Orders Placed This Encounter  Procedures  . CMP14+EGFR  . Lipid panel  . TSH    JCaryl Pina MD [facility] 12/29/2019, 9:55 AM

## 2019-12-30 LAB — CMP14+EGFR
ALT: 26 IU/L (ref 0–44)
AST: 22 IU/L (ref 0–40)
Albumin/Globulin Ratio: 2 (ref 1.2–2.2)
Albumin: 4.2 g/dL (ref 3.7–4.7)
Alkaline Phosphatase: 66 IU/L (ref 44–121)
BUN/Creatinine Ratio: 28 — ABNORMAL HIGH (ref 10–24)
BUN: 26 mg/dL (ref 8–27)
Bilirubin Total: 0.3 mg/dL (ref 0.0–1.2)
CO2: 24 mmol/L (ref 20–29)
Calcium: 9.2 mg/dL (ref 8.6–10.2)
Chloride: 105 mmol/L (ref 96–106)
Creatinine, Ser: 0.94 mg/dL (ref 0.76–1.27)
GFR calc Af Amer: 94 mL/min/{1.73_m2} (ref >59)
GFR calc non Af Amer: 81 mL/min/{1.73_m2} (ref >59)
Globulin, Total: 2.1 g/dL (ref 1.5–4.5)
Glucose: 104 mg/dL — ABNORMAL HIGH (ref 65–99)
Potassium: 4.6 mmol/L (ref 3.5–5.2)
Sodium: 142 mmol/L (ref 134–144)
Total Protein: 6.3 g/dL (ref 6.0–8.5)

## 2019-12-30 LAB — LIPID PANEL
Chol/HDL Ratio: 3.5 ratio (ref 0.0–5.0)
Cholesterol, Total: 135 mg/dL (ref 100–199)
HDL: 39 mg/dL — ABNORMAL LOW (ref >39)
LDL Chol Calc (NIH): 74 mg/dL (ref 0–99)
Triglycerides: 123 mg/dL (ref 0–149)
VLDL Cholesterol Cal: 22 mg/dL (ref 5–40)

## 2019-12-30 LAB — TSH: TSH: 2.39 u[IU]/mL (ref 0.450–4.500)

## 2020-02-08 ENCOUNTER — Other Ambulatory Visit: Payer: Self-pay | Admitting: Family Medicine

## 2020-02-08 DIAGNOSIS — M1611 Unilateral primary osteoarthritis, right hip: Secondary | ICD-10-CM

## 2020-02-08 DIAGNOSIS — I10 Essential (primary) hypertension: Secondary | ICD-10-CM

## 2020-02-08 DIAGNOSIS — N401 Enlarged prostate with lower urinary tract symptoms: Secondary | ICD-10-CM

## 2020-02-08 DIAGNOSIS — R351 Nocturia: Secondary | ICD-10-CM

## 2020-02-17 DIAGNOSIS — M1611 Unilateral primary osteoarthritis, right hip: Secondary | ICD-10-CM | POA: Diagnosis not present

## 2020-02-18 DIAGNOSIS — B078 Other viral warts: Secondary | ICD-10-CM | POA: Diagnosis not present

## 2020-02-18 DIAGNOSIS — Z85828 Personal history of other malignant neoplasm of skin: Secondary | ICD-10-CM | POA: Diagnosis not present

## 2020-02-18 DIAGNOSIS — L570 Actinic keratosis: Secondary | ICD-10-CM | POA: Diagnosis not present

## 2020-02-18 DIAGNOSIS — D2261 Melanocytic nevi of right upper limb, including shoulder: Secondary | ICD-10-CM | POA: Diagnosis not present

## 2020-02-18 DIAGNOSIS — C44519 Basal cell carcinoma of skin of other part of trunk: Secondary | ICD-10-CM | POA: Diagnosis not present

## 2020-02-18 DIAGNOSIS — L821 Other seborrheic keratosis: Secondary | ICD-10-CM | POA: Diagnosis not present

## 2020-02-23 ENCOUNTER — Telehealth: Payer: Self-pay

## 2020-02-23 NOTE — Telephone Encounter (Signed)
Office visit 04/05/20 with Dr Irish Lack

## 2020-02-23 NOTE — Telephone Encounter (Signed)
Per [place] pt has an appt with is cardiologist on 04/05/20. Would like cardio to complete surgical clearance eval form for right hip arthroplasty. Procedure is scheduled for 05/03/20. Pt agreed to have cardio complete form. He will have [facility] fax a form to them.

## 2020-02-23 NOTE — Telephone Encounter (Signed)
Patient has appointment with Dr. Irish Lack scheduled for 04/05/2020 which is before planned procedure. Therefore, pre-op risk assessment can be addressed at that time. I will route this note to Dr. Irish Lack so that he is aware and will add "pre-op eval" to the appointment notes for upcoming visit.  Pre-op covering staff, can you please just update requesting surgeon's office about this appointment?  Thank you! Corey Duran

## 2020-02-23 NOTE — Telephone Encounter (Signed)
   [facility] Pre-operative Risk Assessment    HEARTCARE STAFF: - Please ensure there is not already an duplicate clearance open for this procedure. - Under Visit Info/Reason for Call, type in Other and utilize the format Clearance MM/DD/YY or Clearance TBD. Do not use dashes or single digits. - If request is for dental extraction, please clarify the # of teeth to be extracted.  Request for surgical clearance:  1. What type of surgery is being performed? RIGHT TOTAL HIP ARTHOPLASTY - ANTERIOR   2. When is this surgery scheduled? 05/03/20   3. What type of clearance is required (medical clearance vs. Pharmacy clearance to hold med vs. Both)? BOTH  4. Are there any medications that need to be held prior to surgery and how long? PLAVIX   5. Practice name and name of physician performing surgery?  [facility]; DR. FRANK ALUISIO   6. What is the office phone number? [phone]   7.   What is the office fax number? [phone] ATTN: [place]  8.   Anesthesia type (None, local, MAC, general) ? CHOICE   Jacinta Shoe 02/23/2020, 11:47 AM  _________________________________________________________________   (provider comments below)

## 2020-04-04 NOTE — Progress Notes (Signed)
Cardiology Office Note   Date:  04/05/2020   ID:  Corey Duran, DOB [birth year], MRN [id number]  PCP:  Dettinger, Fransisca Kaufmann, MD    No chief complaint on file.  CAD  Wt Readings from Last 3 Encounters:  04/05/20 177 lb (80.3 kg)  12/29/19 177 lb (80.3 kg)  09/24/19 177 lb 3.2 oz (80.4 kg)       History of Present Illness: Corey Duran is a 70 y.o. male  who has had CAD. He had an LAD stent( 3.5 x 16 DES ( [facility] study))in 2008. He had been on CPAP in the past but stopped it due to the inconvenience. He sleeps better with the CPAP, but it was not worth the hassle. He plays golf, dances for exercise.  He has lost some weightin 2017through diet control. He lost more in 2017.  Stress test in Jan 2020 showed:  Blood pressure demonstrated a normal response to exercise.  There was no ST segment deviation noted during stress.  ETT with good exercise tolerance (9:00); no chest pain; normal BP response; no ST changes; negative adequate ETT; [facility] treadmill score 9.  He was considering TURP in 2021, but he was not a candidate due to size of prostate.  He continues to have interrupted sleep.  He has to get up 3-10x/night.  Now needs hip surgery: 1. What type of surgery is being performed? RIGHT TOTAL HIP ARTHOPLASTY - ANTERIOR   2. When is this surgery scheduled? 05/03/20    Exercise level has decreased due to hip.    Again in 2008 was severe decreased exercise tolerance, DOE.  He has not had this sx of late.  He rides a bike on occasion and feels well from a cardiac standpoint.   Denies : Chest pain. Dizziness. Leg edema. Nitroglycerin use. Orthopnea. Palpitations. Paroxysmal nocturnal dyspnea. Shortness of breath. Syncope.    Past Medical History:  Diagnosis Date  . BPH (benign prostatic hypertrophy)    followed by Dr. Reece Agar in the past; Now Dr. Diona Fanti  . Coronary atherosclerosis of native coronary artery    05/2006, stent LAD, patent 03/2007,  study stent  . CTS (carpal tunnel syndrome)    1993, 2002  . GERD (gastroesophageal reflux disease)   . Hypercholesteremia   . Insomnia   . Laceration of leg    severe, 2009  . LBP (low back pain)   . OSA (obstructive sleep apnea)    PSG 07/23/10 ESS 11, AHI 16/hr REM 18/hr, RDI 17/hr REM 20/hr, O2 min 87%; CPAP 08/22/10 CPAP 10 with AHI 0  . Tenosynovitis of finger    left third finger    Past Surgical History:  Procedure Laterality Date  . ROTATOR CUFF REPAIR Right 0918/2018     Current Outpatient Medications  Medication Sig Dispense Refill  . clopidogrel (PLAVIX) 75 MG tablet TAKE 1 TABLET(75 MG) BY MOUTH DAILY 90 tablet 0  . Coenzyme Q10 (COQ10) 100 MG CAPS Take 100 mg by mouth daily.    Marland Kitchen ezetimibe-simvastatin (VYTORIN) 10-80 MG tablet TAKE 1/2 TABLET BY MOUTH DAILY 45 tablet 0  . finasteride (PROSCAR) 5 MG tablet TAKE 1 TABLET(5 MG) BY MOUTH DAILY 90 tablet 0  . levothyroxine (SYNTHROID) 50 MCG tablet TAKE 1 TABLET BY MOUTH DAILY 90 tablet 3  . meloxicam (MOBIC) 15 MG tablet TAKE 1 TABLET(15 MG) BY MOUTH DAILY 90 tablet 0  . metoprolol tartrate (LOPRESSOR) 25 MG tablet TAKE 1 TABLET(25 MG) BY MOUTH TWICE DAILY 180  tablet 0  . Multiple Vitamins-Minerals (MULTIVITAMIN PO) Take 1 tablet by mouth daily.    . nitroGLYCERIN (NITROSTAT) 0.4 MG SL tablet Place 1 tablet (0.4 mg total) under the tongue every 5 (five) minutes as needed for chest pain. 25 tablet 3  . Omega-3 Fatty Acids (FISH OIL) 1200 MG CAPS Take 1,200 mg by mouth 2 (two) times daily.    . tamsulosin (FLOMAX) 0.4 MG CAPS capsule TAKE 1 CAPSULE(0.4 MG) BY MOUTH DAILY 90 capsule 0  . valACYclovir (VALTREX) 1000 MG tablet Take 1 tablet (1,000 mg total) by mouth daily. 90 tablet 1   No current facility-administered medications for this visit.    Allergies:   Other    Social History:  The patient  reports that he has quit smoking. He has quit using smokeless tobacco. He reports current alcohol use. He reports that he  does not use drugs.   Family History:  The patient's family history includes CAD in his father; Dementia in his mother; Heart attack in his father; Hypertension in his father; Ulcers in his father.    ROS:  Please see the history of present illness.   Otherwise, review of systems are positive for hip pain.   All other systems are reviewed and negative.    PHYSICAL EXAM: VS:  BP 130/70   Pulse (!) 52   Ht 5' 5.5" (1.664 m)   Wt 177 lb (80.3 kg)   SpO2 98%   BMI 29.01 kg/m  , BMI Body mass index is 29.01 kg/m. GEN: Well nourished, well developed, in no acute distress  HEENT: normal  Neck: no JVD, carotid bruits, or masses Cardiac: RRR; no murmurs, rubs, or gallops,no edema  Respiratory:  clear to auscultation bilaterally, normal work of breathing GI: soft, nontender, nondistended, + BS MS: no deformity or atrophy  Skin: warm and dry, no rash Neuro:  Strength and sensation are intact Psych: euthymic mood, full affect   EKG:   The ekg ordered today demonstrates NSR, no ST changes   Recent Labs: 06/28/2019: Hemoglobin 16.4; Platelets 197 12/29/2019: ALT 26; BUN 26; Creatinine, Ser 0.94; Potassium 4.6; Sodium 142; TSH 2.390   Lipid Panel    Component Value Date/Time   CHOL 135 12/29/2019 1007   TRIG 123 12/29/2019 1007   HDL 39 (L) 12/29/2019 1007   CHOLHDL 3.5 12/29/2019 1007   CHOLHDL 4.3 01/24/2015 1147   VLDL 32 (H) 01/24/2015 1147   LDLCALC 74 12/29/2019 1007   LDLDIRECT 70.4 03/29/2014 1141     Other studies Reviewed: Additional studies/ records that were reviewed today with results demonstrating: prior stress test reviewed.   ASSESSMENT AND PLAN:  1.   CAD: No angina. Refill SL NTG.  Continue aggressive secondary prevention. OK to hold Plavix 5 days prior to surgery.  Negative stress test in 2020.   Walking less, but no sx like his prior angina.  2. Hyperlipidemia: The current medical regimen is effective;  continue present plan and medications. 3. HTN: The  current medical regimen is effective;  continue present plan and medications. 4. OSA: Not Using CPAP due to getting up so frequently.  5. Preoperative eval: We discussed his cardiac status and risks of cardiac event.  No further cardiac testing needed.  He is in agreement.  Form dropped off for preop clearance.  -Dr. Despina Hick.   Current medicines are reviewed at length with the patient today.  The patient concerns regarding his medicines were addressed.  The following changes have been made:  No change  Labs/ tests ordered today include:  No orders of the defined types were placed in this encounter.   Recommend 150 minutes/week of aerobic exercise Low fat, low carb, high fiber diet recommended  Disposition:   FU in 1 year   Signed, Larae Grooms, MD  04/05/2020 11:18 AM    [address], [place], [place]  [address] Phone: [phone]; Fax: [phone]

## 2020-04-05 ENCOUNTER — Encounter: Payer: Self-pay | Admitting: Interventional Cardiology

## 2020-04-05 ENCOUNTER — Other Ambulatory Visit: Payer: Self-pay

## 2020-04-05 ENCOUNTER — Ambulatory Visit (INDEPENDENT_AMBULATORY_CARE_PROVIDER_SITE_OTHER): Payer: Medicare Other | Admitting: Interventional Cardiology

## 2020-04-05 VITALS — BP 130/70 | HR 52 | Ht 65.5 in | Wt 177.0 lb

## 2020-04-05 DIAGNOSIS — E7800 Pure hypercholesterolemia, unspecified: Secondary | ICD-10-CM

## 2020-04-05 DIAGNOSIS — I10 Essential (primary) hypertension: Secondary | ICD-10-CM

## 2020-04-05 DIAGNOSIS — G4733 Obstructive sleep apnea (adult) (pediatric): Secondary | ICD-10-CM

## 2020-04-05 DIAGNOSIS — I2510 Atherosclerotic heart disease of native coronary artery without angina pectoris: Secondary | ICD-10-CM

## 2020-04-05 NOTE — Telephone Encounter (Signed)
No further cardiac testing needed before surgery.   JV 

## 2020-04-05 NOTE — Patient Instructions (Signed)

## 2020-04-21 ENCOUNTER — Other Ambulatory Visit: Payer: Self-pay

## 2020-04-21 ENCOUNTER — Ambulatory Visit (INDEPENDENT_AMBULATORY_CARE_PROVIDER_SITE_OTHER): Payer: Medicare Other | Admitting: Family Medicine

## 2020-04-21 ENCOUNTER — Ambulatory Visit (INDEPENDENT_AMBULATORY_CARE_PROVIDER_SITE_OTHER): Payer: Medicare Other

## 2020-04-21 ENCOUNTER — Encounter: Payer: Self-pay | Admitting: Family Medicine

## 2020-04-21 VITALS — BP 136/70 | HR 51 | Temp 97.7°F | Resp 20 | Ht 65.5 in | Wt 176.0 lb

## 2020-04-21 DIAGNOSIS — Z01818 Encounter for other preprocedural examination: Secondary | ICD-10-CM

## 2020-04-21 DIAGNOSIS — R5383 Other fatigue: Secondary | ICD-10-CM

## 2020-04-21 DIAGNOSIS — Z23 Encounter for immunization: Secondary | ICD-10-CM | POA: Diagnosis not present

## 2020-04-21 LAB — CMP14+EGFR
ALT: 28 IU/L (ref 0–44)
AST: 22 IU/L (ref 0–40)
Albumin/Globulin Ratio: 1.8 (ref 1.2–2.2)
Albumin: 4.2 g/dL (ref 3.7–4.7)
Alkaline Phosphatase: 69 IU/L (ref 44–121)
BUN/Creatinine Ratio: 23 (ref 10–24)
BUN: 24 mg/dL (ref 8–27)
Bilirubin Total: 0.5 mg/dL (ref 0.0–1.2)
CO2: 23 mmol/L (ref 20–29)
Calcium: 9.7 mg/dL (ref 8.6–10.2)
Chloride: 105 mmol/L (ref 96–106)
Creatinine, Ser: 1.03 mg/dL (ref 0.76–1.27)
GFR calc Af Amer: 84 mL/min/{1.73_m2} (ref >59)
GFR calc non Af Amer: 73 mL/min/{1.73_m2} (ref >59)
Globulin, Total: 2.3 g/dL (ref 1.5–4.5)
Glucose: 102 mg/dL — ABNORMAL HIGH (ref 65–99)
Potassium: 4.7 mmol/L (ref 3.5–5.2)
Sodium: 143 mmol/L (ref 134–144)
Total Protein: 6.5 g/dL (ref 6.0–8.5)

## 2020-04-21 LAB — CBC WITH DIFFERENTIAL/PLATELET
Basophils Absolute: 0 10*3/uL (ref 0.0–0.2)
Basos: 1 %
EOS (ABSOLUTE): 0.1 10*3/uL (ref 0.0–0.4)
Eos: 2 %
Hematocrit: 48.1 % (ref 37.5–51.0)
Hemoglobin: 16 g/dL (ref 13.0–17.7)
Immature Grans (Abs): 0 10*3/uL (ref 0.0–0.1)
Immature Granulocytes: 0 %
Lymphocytes Absolute: 1.4 10*3/uL (ref 0.7–3.1)
Lymphs: 29 %
MCH: 31.1 pg (ref 26.6–33.0)
MCHC: 33.3 g/dL (ref 31.5–35.7)
MCV: 94 fL (ref 79–97)
Monocytes Absolute: 0.4 10*3/uL (ref 0.1–0.9)
Monocytes: 8 %
Neutrophils Absolute: 2.8 10*3/uL (ref 1.4–7.0)
Neutrophils: 60 %
Platelets: 159 10*3/uL (ref 150–450)
RBC: 5.14 x10E6/uL (ref 4.14–5.80)
RDW: 12 % (ref 11.6–15.4)
WBC: 4.7 10*3/uL (ref 3.4–10.8)

## 2020-04-21 NOTE — Progress Notes (Signed)
BP 136/70   Pulse (!) 51   Temp 97.7 F (36.5 C)   Resp 20   Ht 5' 5.5" (1.664 m)   Wt 176 lb (79.8 kg)   SpO2 97%   BMI 28.84 kg/m    Subjective:   Patient ID: Corey Duran, male    DOB: Apr 15, 1950, 70 y.o.   MRN: [id number]  HPI: Corey Duran is a 70 y.o. male presenting on 04/21/2020 for Pre-op Exam (For clearance )   HPI Preoperative clearance exam Patient is coming in for preoperative surgical exam.  He denies any major health issues.  He is able to walk more than a mile without getting short of breath and is able to walk up stairs without getting short of breath.  He says he keeps active but his hip is just preventing him from being able to do what he would like to do. Patient denies any chest pain, shortness of breath, headaches or vision issues, abdominal complaints, diarrhea, nausea, vomiting.   Relevant past medical, surgical, family and social history reviewed and updated as indicated. Interim medical history since our last visit reviewed. Allergies and medications reviewed and updated.  Review of Systems  Constitutional: Negative for chills and fever.  Respiratory: Negative for shortness of breath and wheezing.   Cardiovascular: Negative for chest pain and leg swelling.  Musculoskeletal: Positive for arthralgias. Negative for back pain and gait problem.  Skin: Negative for rash.  All other systems reviewed and are negative.   Per HPI unless specifically indicated above   Allergies as of 04/21/2020   No Known Allergies     Medication List       Accurate as of April 21, 2020 10:06 AM. If you have any questions, ask your nurse or doctor.        BEE POLLEN PO Take 1-2 capsules by mouth See admin instructions. 2 caps in the morning, 1 cap in the evening   clopidogrel 75 MG tablet Commonly known as: PLAVIX TAKE 1 TABLET(75 MG) BY MOUTH DAILY What changed: See the new instructions.   CoQ10 100 MG Caps Take 100 mg by mouth daily.    ezetimibe-simvastatin 10-80 MG tablet Commonly known as: VYTORIN TAKE 1/2 TABLET BY MOUTH DAILY   finasteride 5 MG tablet Commonly known as: PROSCAR TAKE 1 TABLET(5 MG) BY MOUTH DAILY What changed: See the new instructions.   Fish Oil 1000 MG Caps Take 1,000 mg by mouth every evening.   levothyroxine 50 MCG tablet Commonly known as: SYNTHROID TAKE 1 TABLET BY MOUTH DAILY What changed: when to take this   meloxicam 15 MG tablet Commonly known as: MOBIC TAKE 1 TABLET(15 MG) BY MOUTH DAILY What changed: See the new instructions.   metoprolol tartrate 25 MG tablet Commonly known as: LOPRESSOR TAKE 1 TABLET(25 MG) BY MOUTH TWICE DAILY What changed: See the new instructions.   MULTIVITAMIN PO Take 1 tablet by mouth daily.   nitroGLYCERIN 0.4 MG SL tablet Commonly known as: NITROSTAT Place 1 tablet (0.4 mg total) under the tongue every 5 (five) minutes as needed for chest pain.   OVER THE COUNTER MEDICATION Take 3 capsules by mouth every morning. Prostagenix   tamsulosin 0.4 MG Caps capsule Commonly known as: FLOMAX TAKE 1 CAPSULE(0.4 MG) BY MOUTH DAILY What changed: See the new instructions.   TURMERIC CURCUMIN PO Take 1,000 mg by mouth daily.   UNABLE TO FIND Med Name: Move Free supp OTC - one a day   valACYclovir 500  MG tablet Commonly known as: VALTREX Take 500 mg by mouth daily as needed (outbreaks).   Zinc 50 MG Caps Take by mouth. One a day        Objective:   Resp 20   Ht 5' 5.5" (1.664 m)   Wt 176 lb (79.8 kg)   BMI 28.84 kg/m   Wt Readings from Last 3 Encounters:  04/21/20 176 lb (79.8 kg)  04/05/20 177 lb (80.3 kg)  12/29/19 177 lb (80.3 kg)    Physical Exam Vitals and nursing note reviewed.  Constitutional:      General: He is not in acute distress.    Appearance: He is well-developed and well-nourished. He is not diaphoretic.  Eyes:     General: No scleral icterus.    Extraocular Movements: EOM normal.     Conjunctiva/sclera:  Conjunctivae normal.  Neck:     Thyroid: No thyromegaly.  Cardiovascular:     Rate and Rhythm: Normal rate and regular rhythm.     Pulses: Intact distal pulses.     Heart sounds: Normal heart sounds. No murmur heard.   Pulmonary:     Effort: Pulmonary effort is normal. No respiratory distress.     Breath sounds: Normal breath sounds. No wheezing.  Abdominal:     General: Abdomen is flat. Bowel sounds are normal. There is no distension.     Tenderness: There is no abdominal tenderness. There is no guarding or rebound.  Musculoskeletal:        General: No edema.     Cervical back: Neck supple.  Lymphadenopathy:     Cervical: No cervical adenopathy.  Skin:    General: Skin is warm and dry.     Findings: No rash.  Neurological:     Mental Status: He is alert and oriented to person, place, and time.     Coordination: Coordination normal.  Psychiatric:        Mood and Affect: Mood and affect normal.        Behavior: Behavior normal.    Chest x-ray: No acute cardiopulmonary abnormality. ekg from cardiology: NSR on 04/05/20  Assessment & Plan:   Problem List Items Addressed This Visit   None   Visit Diagnoses    Preoperative clearance    -  Primary   Relevant Orders   DG Chest 2 View   CBC with Differential/Platelet   CMP14+EGFR   Tdap vaccine greater than or equal to 7yo IM (Completed)   Fatigue, unspecified type       Relevant Orders   VITAMIN D 25 Hydroxy (Vit-D Deficiency, Fractures)   Need for tetanus booster       Relevant Orders   Tdap vaccine greater than or equal to 7yo IM (Completed)    Patient is coming in for preoperative clearance for right hip total arthroplasty.  He is planned to get his surgery on 05/03/2020 by Dr. Aluisio.  Follow up plan: Return if symptoms worsen or fail to improve.  Counseling provided for all of the vaccine components Orders Placed This Encounter  Procedures  . DG Chest 2 View  . CBC with Differential/Platelet  . CMP14+EGFR     Joshua Dettinger, MD [facility] 04/21/2020, 10:06 AM     

## 2020-04-21 NOTE — Progress Notes (Signed)
DUE TO COVID-19 ONLY ONE VISITOR IS ALLOWED TO COME WITH YOU AND STAY IN THE WAITING ROOM ONLY DURING PRE OP AND PROCEDURE DAY OF SURGERY. THE 1 VISITOR  MAY VISIT WITH YOU AFTER SURGERY IN YOUR PRIVATE ROOM DURING VISITING HOURS ONLY!  YOU NEED TO HAVE A COVID 19 TEST ON__1/05/2020 _____ @_______ , THIS TEST MUST BE DONE BEFORE SURGERY,  COVID TESTING SITE [address] [place] [address], IT IS ON THE RIGHT GOING OUT [address] APPROXIMATELY  2 MINUTES PAST [employer] ON THE RIGHT. ONCE YOUR COVID TEST IS COMPLETED,  PLEASE BEGIN THE QUARANTINE INSTRUCTIONS AS OUTLINED IN YOUR HANDOUT.                Corey Duran  04/21/2020   Your procedure is scheduled on: 05/03/2020    Report to [facility] Main  Entrance   Report to admitting at   1020  AM     Call this number if you have problems the morning of surgery [phone]    REMEMBER: NO  SOLID FOOD CANDY OR GUM AFTER MIDNIGHT. CLEAR LIQUIDS UNTIL         . NOTHING BY MOUTH EXCEPT CLEAR LIQUIDS UNTIL    0950am     . PLEASE FINISH ENSURE DRINK PER SURGEON ORDER  WHICH NEEDS TO BE COMPLETED AT   0950am    .      CLEAR LIQUID DIET   Foods Allowed                                                                    Coffee and tea, regular and decaf                            Fruit ices (not with fruit pulp)                                      Iced Popsicles                                    Carbonated beverages, regular and diet                                    Cranberry, grape and apple juices Sports drinks like Gatorade Lightly seasoned clear broth or consume(fat free) Sugar, honey syrup ___________________________________________________________________      BRUSH YOUR TEETH MORNING OF SURGERY AND RINSE YOUR MOUTH OUT, NO CHEWING GUM CANDY OR MINTS.     Take these medicines the morning of surgery with A SIP OF WATER:  Synthroid, metoprolol  DO NOT TAKE ANY DIABETIC MEDICATIONS DAY OF YOUR  SURGERY                               You may not have any metal on your body including hair pins and  piercings  Do not wear jewelry, make-up, lotions, powders or perfumes, deodorant             Do not wear nail polish on your fingernails.  Do not shave  48 hours prior to surgery.              Men may shave face and neck.   Do not bring valuables to the hospital. [place].  Contacts, dentures or bridgework may not be worn into surgery.  Leave suitcase in the car. After surgery it may be brought to your room.     Patients discharged the day of surgery will not be allowed to drive home. IF YOU ARE HAVING SURGERY AND GOING HOME THE SAME DAY, YOU MUST HAVE AN ADULT TO DRIVE YOU HOME AND BE WITH YOU FOR 24 HOURS. YOU MAY GO HOME BY TAXI OR UBER OR ORTHERWISE, BUT AN ADULT MUST ACCOMPANY YOU HOME AND STAY WITH YOU FOR 24 HOURS.  Name and phone number of your driver:  Special Instructions: N/A              Please read over the following fact sheets you were given: _____________________________________________________________________  [facility] - Preparing for Surgery Before surgery, you can play an important role.  Because skin is not sterile, your skin needs to be as free of germs as possible.  You can reduce the number of germs on your skin by washing with CHG (chlorahexidine gluconate) soap before surgery.  CHG is an antiseptic cleaner which kills germs and bonds with the skin to continue killing germs even after washing. Please DO NOT use if you have an allergy to CHG or antibacterial soaps.  If your skin becomes reddened/irritated stop using the CHG and inform your nurse when you arrive at Short Stay. Do not shave (including legs and underarms) for at least 48 hours prior to the first CHG shower.  You may shave your face/neck. Please follow these instructions carefully:  1.  Shower with CHG Soap the night before surgery and the   morning of Surgery.  2.  If you choose to wash your hair, wash your hair first as usual with your  normal  shampoo.  3.  After you shampoo, rinse your hair and body thoroughly to remove the  shampoo.                           4.  Use CHG as you would any other liquid soap.  You can apply chg directly  to the skin and wash                       Gently with a scrungie or clean washcloth.  5.  Apply the CHG Soap to your body ONLY FROM THE NECK DOWN.   Do not use on face/ open                           Wound or open sores. Avoid contact with eyes, ears mouth and genitals (private parts).                       Wash face,  Genitals (private parts) with your normal soap.             6.  Wash thoroughly, paying special attention to the area where your surgery  will be performed.  7.  Thoroughly rinse your body with warm water from the neck down.  8.  DO NOT shower/wash with your normal soap after using and rinsing off  the CHG Soap.                9.  Pat yourself dry with a clean towel.            10.  Wear clean pajamas.            11.  Place clean sheets on your bed the night of your first shower and do not  sleep with pets. Day of Surgery : Do not apply any lotions/deodorants the morning of surgery.  Please wear clean clothes to the hospital/surgery center.  FAILURE TO FOLLOW THESE INSTRUCTIONS MAY RESULT IN THE CANCELLATION OF YOUR SURGERY PATIENT SIGNATURE_________________________________  NURSE SIGNATURE__________________________________  ________________________________________________________________________

## 2020-04-21 NOTE — H&P (Signed)
TOTAL HIP ADMISSION H&P  Patient is admitted for right total hip arthroplasty.  Subjective:  Chief Complaint: Right hip pain  HPI: Corey Duran, 70 y.o. male, has a history of pain and functional disability in the right hip due to arthritis and patient has failed non-surgical conservative treatments for greater than 12 weeks to include NSAID's and/or analgesics and activity modification. Onset of symptoms was gradual, starting several years ago with gradually worsening course since that time. The patient noted no past surgery on the right hip. Patient currently rates pain in the right hip at 5 out of 10 with activity. Patient has worsening of pain with activity and weight bearing. AP pelvis, AP and lateral of the right hip dated 02/17/2020 demonstrate bone-on-bone arthritis in the right hip. The left hip still has good space. This condition presents safety issues increasing the risk of falls. There is no current active infection.  Patient Active Problem List   Diagnosis Date Noted  . Hypothyroidism 08/13/2018  . Genital herpes 07/29/2017  . Low back pain 04/07/2015  . Bradycardia 01/23/2015  . Essential hypertension, benign 12/22/2013  . OSA (obstructive sleep apnea) 12/22/2013  . Hypercholesteremia   . Coronary atherosclerosis of native coronary artery     Past Medical History:  Diagnosis Date  . BPH (benign prostatic hypertrophy)    followed by Dr. Reece Agar in the past; Now Dr. Diona Fanti  . Coronary atherosclerosis of native coronary artery    05/2006, stent LAD, patent 03/2007, study stent  . CTS (carpal tunnel syndrome)    1993, 2002  . GERD (gastroesophageal reflux disease)   . Hypercholesteremia   . Insomnia   . Laceration of leg    severe, 2009  . LBP (low back pain)   . OSA (obstructive sleep apnea)    PSG 07/23/10 ESS 11, AHI 16/hr REM 18/hr, RDI 17/hr REM 20/hr, O2 min 87%; CPAP 08/22/10 CPAP 10 with AHI 0  . Tenosynovitis of finger    left third finger    Past  Surgical History:  Procedure Laterality Date  . ROTATOR CUFF REPAIR Right 0918/2018    Prior to Admission medications   Medication Sig Start Date End Date Taking? Authorizing Provider  BEE POLLEN PO Take 1-2 capsules by mouth See admin instructions. 2 caps in the morning, 1 cap in the evening   Yes [provider]  clopidogrel (PLAVIX) 75 MG tablet TAKE 1 TABLET(75 MG) BY MOUTH DAILY Patient taking differently: Take 75 mg by mouth daily. TAKE 1 TABLET(75 MG) BY MOUTH DAILY 02/08/20  Yes Dettinger, Fransisca Kaufmann, MD  Coenzyme Q10 (COQ10) 100 MG CAPS Take 100 mg by mouth daily.   Yes [provider]  ezetimibe-simvastatin (VYTORIN) 10-80 MG tablet TAKE 1/2 TABLET BY MOUTH DAILY Patient taking differently: Take 0.5 tablets by mouth daily. 11/10/19  Yes Dettinger, Fransisca Kaufmann, MD  finasteride (PROSCAR) 5 MG tablet TAKE 1 TABLET(5 MG) BY MOUTH DAILY Patient taking differently: Take 5 mg by mouth daily. 02/08/20  Yes Dettinger, Fransisca Kaufmann, MD  levothyroxine (SYNTHROID) 50 MCG tablet TAKE 1 TABLET BY MOUTH DAILY Patient taking differently: Take 50 mcg by mouth daily before breakfast. 08/12/19  Yes Dettinger, Fransisca Kaufmann, MD  meloxicam (MOBIC) 15 MG tablet TAKE 1 TABLET(15 MG) BY MOUTH DAILY Patient taking differently: Take 15 mg by mouth daily. 02/08/20  Yes Dettinger, Fransisca Kaufmann, MD  metoprolol tartrate (LOPRESSOR) 25 MG tablet TAKE 1 TABLET(25 MG) BY MOUTH TWICE DAILY Patient taking differently: Take 25 mg by mouth  2 (two) times daily. TAKE 1 TABLET(25 MG) BY MOUTH TWICE DAILY 02/08/20  Yes Dettinger, Fransisca Kaufmann, MD  Multiple Vitamins-Minerals (MULTIVITAMIN PO) Take 1 tablet by mouth daily.   Yes [provider]  nitroGLYCERIN (NITROSTAT) 0.4 MG SL tablet Place 1 tablet (0.4 mg total) under the tongue every 5 (five) minutes as needed for chest pain. Patient not taking: Reported on 04/21/2020 04/06/19  Yes Jettie Booze, MD  Omega-3 Fatty Acids (FISH OIL) 1000 MG CAPS Take 1,000 mg by mouth  every evening.   Yes [provider]  OVER THE COUNTER MEDICATION Take 3 capsules by mouth every morning. Prostagenix   Yes [provider]  tamsulosin (FLOMAX) 0.4 MG CAPS capsule TAKE 1 CAPSULE(0.4 MG) BY MOUTH DAILY Patient taking differently: Take 0.4 mg by mouth daily. 02/08/20  Yes Dettinger, Fransisca Kaufmann, MD  TURMERIC CURCUMIN PO Take 1,000 mg by mouth daily.   Yes [provider]  valACYclovir (VALTREX) 500 MG tablet Take 500 mg by mouth daily as needed (outbreaks).   Yes [provider]  UNABLE TO FIND Med Name: Move Free supp OTC - one a day    [provider]  Zinc 50 MG CAPS Take by mouth. One a day    [provider]    No Known Allergies  Social History   Socioeconomic History  . Marital status: Divorced    Spouse name: Not on file  . Number of children: Not on file  . Years of education: Not on file  . Highest education level: Not on file  Occupational History  . Not on file  Tobacco Use  . Smoking status: Former Research scientist (life sciences)  . Smokeless tobacco: Former Network engineer and Sexual Activity  . Alcohol use: Yes  . Drug use: No  . Sexual activity: Not on file  Other Topics Concern  . Not on file  Social History Narrative  . Not on file   Social Determinants of Health   Financial Resource Strain: Not on file  Food Insecurity: Not on file  Transportation Needs: Not on file  Physical Activity: Not on file  Stress: Not on file  Social Connections: Not on file  Intimate Partner Violence: Not on file    Tobacco Use: Medium Risk  . Smoking Tobacco Use: Former Smoker  . Smokeless Tobacco Use: Former Systems developer   Social History   Substance and Sexual Activity  Alcohol Use Yes    Family History  Problem Relation Age of Onset  . Dementia Mother   . Ulcers Father   . Hypertension Father   . CAD Father   . Heart attack Father   . Stroke Neg Hx     Review of Systems  Constitutional: Negative for chills, fever and  weight loss.  Respiratory: Negative for cough, shortness of breath and wheezing.   Cardiovascular: Negative for chest pain, palpitations and leg swelling.  Musculoskeletal: Positive for joint pain. Negative for back pain and myalgias.  Neurological: Negative for dizziness, weakness and headaches.     Objective:  Physical Exam: ? Well nourished and well developed.  General: Alert and oriented x3, cooperative and pleasant, no acute distress.  Head: normocephalic, atraumatic, neck supple.  Eyes: EOMI.  Respiratory: breath sounds clear in all fields, no wheezing, rales, or rhonchi. Cardiovascular: Regular rate and rhythm, no murmurs, gallops or rubs.  Abdomen: non-tender to palpation and soft, normoactive bowel sounds.  Musculoskeletal: ? Right Hip Exam:  The range of motion: Flexion to 100  degrees, Internal Rotation is minimal, External Rotation to 20 degrees, and abduction to 20 degrees without discomfort.  There is no tenderness over the greater trochanteric bursa.    Left Hip Exam:  The range of motion: Normal without discomfort.  There is no tenderness over the greater trochanteric bursa.  ? Calves soft and nontender. Motor function intact in LE. Strength 5/5 LE bilaterally. Neuro: Distal pulses 2+. Sensation to light touch intact in LE.  Vital signs in last 24 hours: Temp:  [97.7 F (36.5 C)] 97.7 F (36.5 C) (01/21 0958) Pulse Rate:  [51] 51 (01/21 0958) Resp:  [20] 20 (01/21 0958) BP: (136)/(70) 136/70 (01/21 0958) SpO2:  [97 %] 97 % (01/21 0958) Weight:  [79.8 kg] 79.8 kg (01/21 0958)  Imaging Review AP pelvis, AP and lateral of the right hip dated 02/17/2020 demonstrate bone-on-bone arthritis in the right hip. The left hip still has good space.   Assessment/Plan:  End stage arthritis, right hip  The patient history, physical examination, clinical judgement of the provider and imaging studies are consistent with end stage degenerative joint disease of the right  hip and total hip arthroplasty is deemed medically necessary. The treatment options including medical management, injection therapy, arthroscopy and arthroplasty were discussed at length. The risks and benefits of total hip arthroplasty were presented and reviewed. The risks due to aseptic loosening, infection, stiffness, dislocation/subluxation, thromboembolic complications and other imponderables were discussed. The patient acknowledged the explanation, agreed to proceed with the plan and consent was signed. Patient is being admitted for inpatient treatment for surgery, pain control, PT, OT, prophylactic antibiotics, VTE prophylaxis, progressive ambulation and ADLs and discharge planning.The patient is planning to be discharged home.   Patient's anticipated LOS is less than 2 midnights, meeting these requirements: - Lives within 1 hour of care - Has a competent adult at home to recover with post-op recover - NO history of  - Chronic pain requiring opiods  - Diabetes  - Coronary Artery Disease  - Heart failure  - Heart attack  - Stroke  - DVT/VTE  - Cardiac arrhythmia  - Respiratory Failure/COPD  - Renal failure  - Anemia  - Advanced Liver disease  Therapy Plans: HEP Disposition: Home with girlfriend Planned DVT Prophylaxis: Plavix DME Needed: None PCP: Vonna Kotyk Dettinger (Clearance not received) Cardiology: Larae Grooms, MD (Clearance received) TXA: IV Allergies: NKDA Anesthesia Concerns: None BMI: 29.1 Last HgbA1c: None  Pharmacy: [facility] in [place] ([employer])  Other: Patient complained of poor pain control with rotator cuff repair in 2019.  Has history of enlarged prostate.   - Patient was instructed on what medications to stop prior to surgery. - Follow-up visit in 2 weeks with Dr. Wynelle Link - Begin physical therapy following surgery - Pre-operative lab work as pre-surgical testing - Prescriptions will be provided in hospital at time of discharge  Fenton Foy, [facility], PA-C Orthopedic Surgery [facility] [place]

## 2020-04-22 LAB — VITAMIN D 25 HYDROXY (VIT D DEFICIENCY, FRACTURES): Vit D, 25-Hydroxy: 29.7 ng/mL — ABNORMAL LOW (ref 30.0–100.0)

## 2020-04-25 ENCOUNTER — Encounter (HOSPITAL_COMMUNITY): Payer: Self-pay

## 2020-04-25 ENCOUNTER — Encounter (HOSPITAL_COMMUNITY)
Admission: RE | Admit: 2020-04-25 | Discharge: 2020-04-25 | Disposition: A | Discharge status: Home / Self Care | Payer: Medicare Other | Source: Ambulatory Visit | Attending: Orthopedic Surgery | Admitting: Orthopedic Surgery

## 2020-04-25 ENCOUNTER — Other Ambulatory Visit: Payer: Self-pay

## 2020-04-25 DIAGNOSIS — Z79899 Other long term (current) drug therapy: Secondary | ICD-10-CM | POA: Insufficient documentation

## 2020-04-25 DIAGNOSIS — Z7902 Long term (current) use of antithrombotics/antiplatelets: Secondary | ICD-10-CM | POA: Insufficient documentation

## 2020-04-25 DIAGNOSIS — G4733 Obstructive sleep apnea (adult) (pediatric): Secondary | ICD-10-CM | POA: Insufficient documentation

## 2020-04-25 DIAGNOSIS — Z01812 Encounter for preprocedural laboratory examination: Secondary | ICD-10-CM | POA: Diagnosis not present

## 2020-04-25 DIAGNOSIS — Z955 Presence of coronary angioplasty implant and graft: Secondary | ICD-10-CM | POA: Insufficient documentation

## 2020-04-25 DIAGNOSIS — Z79890 Hormone replacement therapy (postmenopausal): Secondary | ICD-10-CM | POA: Insufficient documentation

## 2020-04-25 DIAGNOSIS — Z791 Long term (current) use of non-steroidal anti-inflammatories (NSAID): Secondary | ICD-10-CM | POA: Insufficient documentation

## 2020-04-25 DIAGNOSIS — M1611 Unilateral primary osteoarthritis, right hip: Secondary | ICD-10-CM | POA: Diagnosis not present

## 2020-04-25 DIAGNOSIS — I2510 Atherosclerotic heart disease of native coronary artery without angina pectoris: Secondary | ICD-10-CM | POA: Diagnosis not present

## 2020-04-25 HISTORY — DX: Pneumonia, unspecified organism: J18.9

## 2020-04-25 HISTORY — DX: Cardiac murmur, unspecified: R01.1

## 2020-04-25 HISTORY — DX: Unspecified osteoarthritis, unspecified site: M19.90

## 2020-04-25 LAB — SURGICAL PCR SCREEN
MRSA, PCR: NEGATIVE
Staphylococcus aureus: NEGATIVE

## 2020-04-25 LAB — COMPREHENSIVE METABOLIC PANEL
ALT: 26 U/L (ref 0–44)
AST: 21 U/L (ref 15–41)
Albumin: 4.3 g/dL (ref 3.5–5.0)
Alkaline Phosphatase: 62 U/L (ref 38–126)
Anion gap: 8 (ref 5–15)
BUN: 27 mg/dL — ABNORMAL HIGH (ref 8–23)
CO2: 26 mmol/L (ref 22–32)
Calcium: 9.3 mg/dL (ref 8.9–10.3)
Chloride: 105 mmol/L (ref 98–111)
Creatinine, Ser: 0.92 mg/dL (ref 0.61–1.24)
GFR, Estimated: >60 mL/min (ref >60)
Glucose, Bld: 105 mg/dL — ABNORMAL HIGH (ref 70–99)
Potassium: 4.2 mmol/L (ref 3.5–5.1)
Sodium: 139 mmol/L (ref 135–145)
Total Bilirubin: 0.7 mg/dL (ref 0.3–1.2)
Total Protein: 6.9 g/dL (ref 6.5–8.1)

## 2020-04-25 LAB — APTT: aPTT: 32 seconds (ref 24–36)

## 2020-04-25 LAB — PROTIME-INR
INR: 1 (ref 0.8–1.2)
Prothrombin Time: 13.2 seconds (ref 11.4–15.2)

## 2020-04-25 LAB — CBC
HCT: 47.9 % (ref 39.0–52.0)
Hemoglobin: 16 g/dL (ref 13.0–17.0)
MCH: 31.6 pg (ref 26.0–34.0)
MCHC: 33.4 g/dL (ref 30.0–36.0)
MCV: 94.5 fL (ref 80.0–100.0)
Platelets: 171 10*3/uL (ref 150–400)
RBC: 5.07 MIL/uL (ref 4.22–5.81)
RDW: 11.7 % (ref 11.5–15.5)
WBC: 5.7 10*3/uL (ref 4.0–10.5)
nRBC: 0 % (ref 0.0–0.2)

## 2020-04-25 NOTE — Progress Notes (Addendum)
Anesthesia Review:  PCP:    Cardiologist : DR Irish Lack- 04/05/20 Chest x-ray : EKG : 04/05/20 Echo : Stress test: 2020  Cardiac Cath :  Activity level:  Sleep Study/ CPAP : has cpap does not use  Fasting Blood Sugar :      / Checks Blood Sugar -- times a day:   Blood Thinner/ Instructions /Last Dose: ASA / Instructions/ Last Dose :  Plavix- last dose 5 days prior to surgery which will be 04/28/19 per pt

## 2020-04-26 ENCOUNTER — Encounter (HOSPITAL_COMMUNITY): Payer: Self-pay | Admitting: Physician Assistant

## 2020-04-26 NOTE — Progress Notes (Signed)
Anesthesia Chart Review   Case: [id number] Date/Time: 05/03/20 1235   Procedure: TOTAL HIP ARTHROPLASTY ANTERIOR APPROACH (Right Hip) - 171min   Anesthesia type: Choice   Pre-op diagnosis: right hip osteoarthritis   Location: [facility] ROOM 09 / [facility]   Surgeons: Gaynelle Arabian, MD      DISCUSSION:70 y.o. former smoker with h/o OSA, CAD (DES to LAD 2008), right hip OA scheduled for above procedure 05/03/20 with Dr. Gaynelle Arabian.   Pt last seen by cardiology 04/05/2020. Per OV note, "Preoperative eval: We discussed his cardiac status and risks of cardiac event.  No further cardiac testing needed.  He is in agreement.  Form dropped off for preop clearance."  Anticipate pt can proceed with planned procedure barring acute status change.     VS: BP (!) 153/79   Pulse (!) 57   Temp 36.8 C (Oral)   Resp 16   Ht 5\' 5"  (1.651 m)   SpO2 96%   BMI 29.29 kg/m   PROVIDERS: Dettinger, Fransisca Kaufmann, MD is PCP   Larae Grooms, MD is Cardiologist  LABS: Labs reviewed: Acceptable for surgery. (all labs ordered are listed, but only abnormal results are displayed)  Labs Reviewed  COMPREHENSIVE METABOLIC PANEL - Abnormal; Notable for the following components:      Result Value   Glucose, Bld 105 (*)    BUN 27 (*)    All other components within normal limits  SURGICAL PCR SCREEN  CBC  PROTIME-INR  APTT  TYPE AND SCREEN     IMAGES:   EKG: 04/05/2020 Rate 52 bpm  NSR  CV: Stress Test 04/02/2018  Blood pressure demonstrated a normal response to exercise.  There was no ST segment deviation noted during stress.   ETT with good exercise tolerance (9:00); no chest pain; normal BP response; no ST changes; negative adequate ETT; Duke treadmill score 9.  Past Medical History:  Diagnosis Date  . Arthritis   . BPH (benign prostatic hypertrophy)    followed by Dr. Reece Agar in the past; Now Dr. Diona Fanti  . Coronary atherosclerosis of native coronary artery    05/2006, stent LAD, patent  03/2007, study stent  . Heart murmur   . Hypercholesteremia   . Insomnia   . Laceration of leg    severe, 2009  . LBP (low back pain)   . OSA (obstructive sleep apnea)    PSG 07/23/10 ESS 11, AHI 16/hr REM 18/hr, RDI 17/hr REM 20/hr, O2 min 87%; CPAP 08/22/10 CPAP 10 with AHI 0  . Pneumonia    at age 27   . Tenosynovitis of finger    left third finger    Past Surgical History:  Procedure Laterality Date  . CORONARY ANGIOPLASTY     stent- 05/2006   . ROTATOR CUFF REPAIR Right 0918/2018    MEDICATIONS: . BEE POLLEN PO  . clopidogrel (PLAVIX) 75 MG tablet  . Coenzyme Q10 (COQ10) 100 MG CAPS  . ezetimibe-simvastatin (VYTORIN) 10-80 MG tablet  . finasteride (PROSCAR) 5 MG tablet  . levothyroxine (SYNTHROID) 50 MCG tablet  . meloxicam (MOBIC) 15 MG tablet  . metoprolol tartrate (LOPRESSOR) 25 MG tablet  . Multiple Vitamins-Minerals (MULTIVITAMIN PO)  . nitroGLYCERIN (NITROSTAT) 0.4 MG SL tablet  . Omega-3 Fatty Acids (FISH OIL) 1000 MG CAPS  . OVER THE COUNTER MEDICATION  . tamsulosin (FLOMAX) 0.4 MG CAPS capsule  . TURMERIC CURCUMIN PO  . UNABLE TO FIND  . valACYclovir (VALTREX) 500 MG tablet  . Zinc 50 MG  CAPS   No current facility-administered medications for this encounter.     Konrad Felix, PA-C [facility] Pre-Surgical Testing [phone]

## 2020-04-26 NOTE — Anesthesia Preprocedure Evaluation (Deleted)
Anesthesia Evaluation    Airway        Dental   Pulmonary former smoker,           Cardiovascular      Neuro/Psych    GI/Hepatic   Endo/Other    Renal/GU      Musculoskeletal   Abdominal   Peds  Hematology   Anesthesia Other Findings   Reproductive/Obstetrics                             Anesthesia Physical Anesthesia Plan  ASA:   Anesthesia Plan:    Post-op Pain Management:    Induction:   PONV Risk Score and Plan:   Airway Management Planned:   Additional Equipment:   Intra-op Plan:   Post-operative Plan:   Informed Consent:   Plan Discussed with:   Anesthesia Plan Comments: (See PAT note 04/25/2020, Konrad Felix, PA-C)        Anesthesia Quick Evaluation

## 2020-04-29 ENCOUNTER — Inpatient Hospital Stay (HOSPITAL_COMMUNITY): Admission: RE | Admit: 2020-04-29 | Payer: Medicare Other | Source: Ambulatory Visit

## 2020-05-03 LAB — TYPE AND SCREEN
ABO/RH(D): O POS
Antibody Screen: NEGATIVE

## 2020-05-08 ENCOUNTER — Other Ambulatory Visit: Payer: Self-pay | Admitting: Family Medicine

## 2020-05-08 DIAGNOSIS — M1611 Unilateral primary osteoarthritis, right hip: Secondary | ICD-10-CM

## 2020-05-08 DIAGNOSIS — N401 Enlarged prostate with lower urinary tract symptoms: Secondary | ICD-10-CM

## 2020-05-08 DIAGNOSIS — E7800 Pure hypercholesterolemia, unspecified: Secondary | ICD-10-CM

## 2020-05-08 DIAGNOSIS — I10 Essential (primary) hypertension: Secondary | ICD-10-CM

## 2020-06-13 NOTE — Patient Instructions (Addendum)
DUE TO COVID-19 ONLY ONE VISITOR IS ALLOWED TO COME WITH YOU AND STAY IN THE WAITING ROOM ONLY DURING PRE OP AND PROCEDURE DAY OF SURGERY. THE 1 VISITOR  MAY VISIT WITH YOU AFTER SURGERY IN YOUR PRIVATE ROOM DURING VISITING HOURS ONLY!  YOU NEED TO HAVE A COVID 19 TEST ON__3/19_____ @__11 :30_____, THIS TEST MUST BE DONE BEFORE SURGERY,  COVID TESTING SITE [place] [address], IT IS ON THE RIGHT GOING OUT [address] APPROXIMATELY  2 MINUTES PAST [employer] ON THE RIGHT. ONCE YOUR COVID TEST IS COMPLETED,  PLEASE BEGIN THE QUARANTINE INSTRUCTIONS AS OUTLINED IN YOUR HANDOUT.                ZAVIER CANELA    Your procedure is scheduled on: 3/23/   Report to [facility]  Entrance   Report to admitting at 8:40 AM     Call this number if you have problems the morning of surgery [place], NO [place].   No food after midnight.    You may have clear liquid until 8:00 AM.    At 7:30 AM drink pre surgery drink.   Nothing by mouth after 8:00 AM.   Take these medicines the morning of surgery with A SIP OF WATER: Flomax, Finasteride  DO NOT TAKE ANY DIABETIC MEDICATIONS DAY OF YOUR SURGERY                               You may not have any metal on your body including               piercings  Do not wear jewelry, , lotions, powders or deodorant              Men may shave face and neck.   Do not bring valuables to the hospital. [place].  Contacts, dentures or bridgework may not be worn into surgery.  Leave suitcase in the car. After surgery it may be b      Special Instructions: N/A              Please read over the following fact sheets you were given: _____________________________________________________________________             [facility] - Preparing for Surgery Before surgery, you can  play an important role.  Because skin is not sterile, your skin needs to be as free of germs as possible.  You can reduce the number of germs on your skin by washing with CHG (chlorahexidine gluconate) soap before surgery.  CHG is an antiseptic cleaner which kills germs and bonds with the skin to continue killing germs even after washing. Please DO NOT use if you have an allergy to CHG or antibacterial soaps.  If your skin becomes reddened/irritated stop using the CHG and inform your nurse when you arrive at Short Stay.   You may shave your face/neck. Please follow these instructions carefully:  1.  Shower with CHG Soap the night before surgery and the  morning of Surgery.  2.  If you choose to wash your hair, wash your hair first as usual with your  normal  shampoo.  3.  After you shampoo, rinse  your hair and body thoroughly to remove the  shampoo.                                        4.  Use CHG as you would any other liquid soap.  You can apply chg directly  to the skin and wash                       Gently with a scrungie or clean washcloth.  5.  Apply the CHG Soap to your body ONLY FROM THE NECK DOWN.   Do not use on face/ open                           Wound or open sores. Avoid contact with eyes, ears mouth and genitals (private parts).                       Wash face,  Genitals (private parts) with your normal soap.             6.  Wash thoroughly, paying special attention to the area where your surgery  will be performed.  7.  Thoroughly rinse your body with warm water from the neck down.  8.  DO NOT shower/wash with your normal soap after using and rinsing off  the CHG Soap.             9.  Pat yourself dry with a clean towel.            10.  Wear clean pajamas.            11.  Place clean sheets on your bed the night of your first shower and do not  sleep with pets. Day of Surgery : Do not apply any lotions/deodorants the morning of surgery.  Please wear clean clothes to the  hospital/surgery center.  FAILURE TO FOLLOW THESE INSTRUCTIONS MAY RESULT IN THE CANCELLATION OF YOUR SURGERY PATIENT SIGNATURE_________________________________  NURSE SIGNATURE__________________________________  ________________________________________________________________________   Adam Phenix  An incentive spirometer is a tool that can help keep your lungs clear and active. This tool measures how well you are filling your lungs with each breath. Taking long deep breaths may help reverse or decrease the chance of developing breathing (pulmonary) problems (especially infection) following:  A long period of time when you are unable to move or be active. BEFORE THE PROCEDURE   If the spirometer includes an indicator to show your best effort, your nurse or respiratory therapist will set it to a desired goal.  If possible, sit up straight or lean slightly forward. Try not to slouch.  Hold the incentive spirometer in an upright position. INSTRUCTIONS FOR USE  1. Sit on the edge of your bed if possible, or sit up as far as you can in bed or on a chair. 2. Hold the incentive spirometer in an upright position. 3. Breathe out normally. 4. Place the mouthpiece in your mouth and seal your lips tightly around it. 5. Breathe in slowly and as deeply as possible, raising the piston or the ball toward the top of the column. 6. Hold your breath for 3-5 seconds or for as long as possible. Allow the piston or ball to fall to the bottom of the column. 7. Remove the mouthpiece from your mouth and breathe  out normally. 8. Rest for a few seconds and repeat Steps 1 through 7 at least 10 times every 1-2 hours when you are awake. Take your time and take a few normal breaths between deep breaths. 9. The spirometer may include an indicator to show your best effort. Use the indicator as a goal to work toward during each repetition. 10. After each set of 10 deep breaths, practice coughing to be sure  your lungs are clear. If you have an incision (the cut made at the time of surgery), support your incision when coughing by placing a pillow or rolled up towels firmly against it. Once you are able to get out of bed, walk around indoors and cough well. You may stop using the incentive spirometer when instructed by your caregiver.  RISKS AND COMPLICATIONS  Take your time so you do not get dizzy or light-headed.  If you are in pain, you may need to take or ask for pain medication before doing incentive spirometry. It is harder to take a deep breath if you are having pain. AFTER USE  Rest and breathe slowly and easily.  It can be helpful to keep track of a log of your progress. Your caregiver can provide you with a simple table to help with this. If you are using the spirometer at home, follow these instructions: [place] IF:   You are having difficultly using the spirometer.  You have trouble using the spirometer as often as instructed.  Your pain medication is not giving enough relief while using the spirometer.  You develop fever of 100.5 F (38.1 C) or higher. SEEK IMMEDIATE MEDICAL CARE IF:   You cough up bloody sputum that had not been present before.  You develop fever of 102 F (38.9 C) or greater.  You develop worsening pain at or near the incision site. MAKE SURE YOU:   Understand these instructions.  Will watch your condition.  Will get help right away if you are not doing well or get worse. Document Released: 07/29/2006 Document Revised: 06/10/2011 Document Reviewed: 09/29/2006 [facility] Patient Information 2014 [place], [place].   ________________________________________________________________________

## 2020-06-14 ENCOUNTER — Encounter (HOSPITAL_COMMUNITY): Payer: Self-pay

## 2020-06-14 ENCOUNTER — Encounter (HOSPITAL_COMMUNITY)
Admission: RE | Admit: 2020-06-14 | Discharge: 2020-06-14 | Disposition: A | Discharge status: Home / Self Care | Payer: Medicare Other | Source: Ambulatory Visit | Attending: Orthopedic Surgery | Admitting: Orthopedic Surgery

## 2020-06-14 ENCOUNTER — Other Ambulatory Visit: Payer: Self-pay

## 2020-06-14 DIAGNOSIS — Z01812 Encounter for preprocedural laboratory examination: Secondary | ICD-10-CM | POA: Diagnosis not present

## 2020-06-14 LAB — CBC
HCT: 48.1 % (ref 39.0–52.0)
Hemoglobin: 16.3 g/dL (ref 13.0–17.0)
MCH: 31.6 pg (ref 26.0–34.0)
MCHC: 33.9 g/dL (ref 30.0–36.0)
MCV: 93.2 fL (ref 80.0–100.0)
Platelets: 171 10*3/uL (ref 150–400)
RBC: 5.16 MIL/uL (ref 4.22–5.81)
RDW: 11.5 % (ref 11.5–15.5)
WBC: 5.6 10*3/uL (ref 4.0–10.5)
nRBC: 0 % (ref 0.0–0.2)

## 2020-06-14 LAB — COMPREHENSIVE METABOLIC PANEL
ALT: 30 U/L (ref 0–44)
AST: 27 U/L (ref 15–41)
Albumin: 4.3 g/dL (ref 3.5–5.0)
Alkaline Phosphatase: 59 U/L (ref 38–126)
Anion gap: 7 (ref 5–15)
BUN: 29 mg/dL — ABNORMAL HIGH (ref 8–23)
CO2: 25 mmol/L (ref 22–32)
Calcium: 9.2 mg/dL (ref 8.9–10.3)
Chloride: 106 mmol/L (ref 98–111)
Creatinine, Ser: 0.94 mg/dL (ref 0.61–1.24)
GFR, Estimated: >60 mL/min (ref >60)
Glucose, Bld: 104 mg/dL — ABNORMAL HIGH (ref 70–99)
Potassium: 3.8 mmol/L (ref 3.5–5.1)
Sodium: 138 mmol/L (ref 135–145)
Total Bilirubin: 0.9 mg/dL (ref 0.3–1.2)
Total Protein: 7.1 g/dL (ref 6.5–8.1)

## 2020-06-14 LAB — SURGICAL PCR SCREEN
MRSA, PCR: NEGATIVE
Staphylococcus aureus: NEGATIVE

## 2020-06-14 LAB — PROTIME-INR
INR: 1.1 (ref 0.8–1.2)
Prothrombin Time: 13.6 seconds (ref 11.4–15.2)

## 2020-06-14 LAB — APTT: aPTT: 30 seconds (ref 24–36)

## 2020-06-14 NOTE — Progress Notes (Signed)
COVID Vaccine Completed:Yes Date COVID Vaccine completed:03/18/20- booster- 04/02/20 COVID vaccine manufacturer:  Moderna     PCP - Dr. Lenna Sciara. Dettinger Cardiologist - Dr. Lendell Caprice  Chest x-ray - 04/21/20-epic EKG - 04/05/20-epic Stress Test - 04/02/18-epic ECHO - no Cardiac Cath - 05/2006 with stent Pacemaker/ICD device last checked:NA  Sleep Study - yes CPAP - yes but has not been using it because of BPH symptoms  Fasting Blood Sugar - NA Checks Blood Sugar _____ times a day  Blood Thinner Instructions:Plavix/ Dr. Irish Lack Aspirin Instructions:Stop 5 days prior to DOS/ Dr. Irish Lack Last Dose:06/15/20  Anesthesia review:   Patient denies shortness of breath, fever, cough and chest pain at PAT appointment yes  Patient verbalized understanding of instructions that were given to them at the PAT appointment. Patient was also instructed that they will need to review over the PAT instructions again at home before surgery.yes Pt has no SOB with any activities

## 2020-06-17 ENCOUNTER — Other Ambulatory Visit (HOSPITAL_COMMUNITY)
Admission: RE | Admit: 2020-06-17 | Discharge: 2020-06-17 | Disposition: A | Discharge status: Home / Self Care | Payer: Medicare Other | Source: Ambulatory Visit | Attending: Orthopedic Surgery | Admitting: Orthopedic Surgery

## 2020-06-17 DIAGNOSIS — Z20822 Contact with and (suspected) exposure to covid-19: Secondary | ICD-10-CM | POA: Insufficient documentation

## 2020-06-17 DIAGNOSIS — Z01812 Encounter for preprocedural laboratory examination: Secondary | ICD-10-CM | POA: Insufficient documentation

## 2020-06-17 LAB — SARS CORONAVIRUS 2 (TAT 6-24 HRS): SARS Coronavirus 2: NEGATIVE

## 2020-06-20 ENCOUNTER — Other Ambulatory Visit (HOSPITAL_COMMUNITY): Payer: Medicare Other

## 2020-06-21 ENCOUNTER — Observation Stay (HOSPITAL_COMMUNITY): Payer: Medicare Other | Admitting: Physician Assistant

## 2020-06-21 ENCOUNTER — Observation Stay (HOSPITAL_COMMUNITY): Payer: Medicare Other

## 2020-06-21 ENCOUNTER — Observation Stay (HOSPITAL_COMMUNITY)
Admission: RE | Admit: 2020-06-21 | Discharge: 2020-06-22 | Disposition: A | Discharge status: Home / Self Care | Payer: Medicare Other | Attending: Orthopedic Surgery | Admitting: Orthopedic Surgery

## 2020-06-21 ENCOUNTER — Observation Stay (HOSPITAL_COMMUNITY): Payer: Medicare Other | Admitting: Anesthesiology

## 2020-06-21 ENCOUNTER — Encounter (HOSPITAL_COMMUNITY): Payer: Self-pay | Admitting: Orthopedic Surgery

## 2020-06-21 ENCOUNTER — Other Ambulatory Visit: Payer: Self-pay

## 2020-06-21 ENCOUNTER — Encounter (HOSPITAL_COMMUNITY)
Admission: RE | Disposition: A | Discharge status: Home / Self Care | Payer: Self-pay | Source: Home / Self Care | Attending: Orthopedic Surgery

## 2020-06-21 DIAGNOSIS — I10 Essential (primary) hypertension: Secondary | ICD-10-CM | POA: Insufficient documentation

## 2020-06-21 DIAGNOSIS — Z7982 Long term (current) use of aspirin: Secondary | ICD-10-CM | POA: Diagnosis not present

## 2020-06-21 DIAGNOSIS — I2510 Atherosclerotic heart disease of native coronary artery without angina pectoris: Secondary | ICD-10-CM | POA: Insufficient documentation

## 2020-06-21 DIAGNOSIS — Z87891 Personal history of nicotine dependence: Secondary | ICD-10-CM | POA: Insufficient documentation

## 2020-06-21 DIAGNOSIS — E039 Hypothyroidism, unspecified: Secondary | ICD-10-CM | POA: Insufficient documentation

## 2020-06-21 DIAGNOSIS — M1611 Unilateral primary osteoarthritis, right hip: Secondary | ICD-10-CM | POA: Diagnosis not present

## 2020-06-21 DIAGNOSIS — Z79899 Other long term (current) drug therapy: Secondary | ICD-10-CM | POA: Diagnosis not present

## 2020-06-21 DIAGNOSIS — Z471 Aftercare following joint replacement surgery: Secondary | ICD-10-CM | POA: Diagnosis not present

## 2020-06-21 DIAGNOSIS — Z96641 Presence of right artificial hip joint: Secondary | ICD-10-CM | POA: Diagnosis not present

## 2020-06-21 DIAGNOSIS — Z96649 Presence of unspecified artificial hip joint: Secondary | ICD-10-CM

## 2020-06-21 DIAGNOSIS — Z419 Encounter for procedure for purposes other than remedying health state, unspecified: Secondary | ICD-10-CM

## 2020-06-21 DIAGNOSIS — M169 Osteoarthritis of hip, unspecified: Secondary | ICD-10-CM

## 2020-06-21 HISTORY — PX: TOTAL HIP ARTHROPLASTY: SHX124

## 2020-06-21 LAB — TYPE AND SCREEN
ABO/RH(D): O POS
Antibody Screen: NEGATIVE

## 2020-06-21 SURGERY — ARTHROPLASTY, HIP, TOTAL, ANTERIOR APPROACH
Anesthesia: Spinal | Site: Hip | Laterality: Right

## 2020-06-21 MED ORDER — PROPOFOL 500 MG/50ML IV EMUL
INTRAVENOUS | Status: DC | PRN
  Administered 2020-06-21: 25 ug/kg/min via INTRAVENOUS

## 2020-06-21 MED ORDER — CLOPIDOGREL BISULFATE 75 MG PO TABS
75.0000 mg | ORAL_TABLET | Freq: Every day | ORAL | Status: DC
  Administered 2020-06-22: 75 mg via ORAL
  Filled 2020-06-21: qty 1

## 2020-06-21 MED ORDER — PHENOL 1.4 % MT LIQD: 1.0000 | OROMUCOSAL | Status: DC | PRN

## 2020-06-21 MED ORDER — DOCUSATE SODIUM 100 MG PO CAPS
100.0000 mg | ORAL_CAPSULE | Freq: Two times a day (BID) | ORAL | Status: DC
  Administered 2020-06-21 – 2020-06-22 (×2): 100 mg via ORAL
  Filled 2020-06-21 (×2): qty 1

## 2020-06-21 MED ORDER — METOPROLOL TARTRATE 25 MG PO TABS
25.0000 mg | ORAL_TABLET | Freq: Two times a day (BID) | ORAL | Status: DC
  Administered 2020-06-21: 25 mg via ORAL
  Filled 2020-06-21 (×3): qty 1

## 2020-06-21 MED ORDER — EPHEDRINE SULFATE 50 MG/ML IJ SOLN
INTRAMUSCULAR | Status: DC | PRN
  Administered 2020-06-21: 10 mg via INTRAVENOUS
  Administered 2020-06-21 (×2): 5 mg via INTRAVENOUS
  Administered 2020-06-21: 15 mg via INTRAVENOUS

## 2020-06-21 MED ORDER — DEXAMETHASONE SODIUM PHOSPHATE 10 MG/ML IJ SOLN
10.0000 mg | Freq: Once | INTRAMUSCULAR | Status: AC
  Administered 2020-06-22: 10 mg via INTRAVENOUS
  Filled 2020-06-21: qty 1

## 2020-06-21 MED ORDER — PHENYLEPHRINE HCL-NACL 10-0.9 MG/250ML-% IV SOLN
INTRAVENOUS | Status: DC | PRN
  Administered 2020-06-21: 50 ug/min via INTRAVENOUS

## 2020-06-21 MED ORDER — ACETAMINOPHEN 10 MG/ML IV SOLN
1000.0000 mg | Freq: Four times a day (QID) | INTRAVENOUS | Status: DC
  Administered 2020-06-21: 1000 mg via INTRAVENOUS
  Filled 2020-06-21: qty 100

## 2020-06-21 MED ORDER — METOCLOPRAMIDE HCL 5 MG PO TABS
5.0000 mg | ORAL_TABLET | Freq: Three times a day (TID) | ORAL | Status: DC | PRN
  Filled 2020-06-21: qty 2

## 2020-06-21 MED ORDER — MIDAZOLAM HCL 5 MG/5ML IJ SOLN
INTRAMUSCULAR | Status: DC | PRN
  Administered 2020-06-21 (×2): 1 mg via INTRAVENOUS

## 2020-06-21 MED ORDER — MAGNESIUM CITRATE PO SOLN: 1.0000 | Freq: Once | ORAL | Status: DC | PRN

## 2020-06-21 MED ORDER — POVIDONE-IODINE 10 % EX SWAB
2.0000 "application " | Freq: Once | CUTANEOUS | Status: AC
  Administered 2020-06-21: 2 via TOPICAL

## 2020-06-21 MED ORDER — FINASTERIDE 5 MG PO TABS
5.0000 mg | ORAL_TABLET | Freq: Every day | ORAL | Status: DC
  Administered 2020-06-22: 5 mg via ORAL
  Filled 2020-06-21: qty 1

## 2020-06-21 MED ORDER — EZETIMIBE-SIMVASTATIN 10-80 MG PO TABS
0.5000 | ORAL_TABLET | Freq: Every day | ORAL | Status: DC
  Filled 2020-06-21: qty 0.5

## 2020-06-21 MED ORDER — BUPIVACAINE HCL 0.25 % IJ SOLN
INTRAMUSCULAR | Status: DC | PRN
  Administered 2020-06-21: 30 mL

## 2020-06-21 MED ORDER — LACTATED RINGERS IV SOLN: INTRAVENOUS | Status: DC

## 2020-06-21 MED ORDER — CEFAZOLIN SODIUM-DEXTROSE 2-4 GM/100ML-% IV SOLN
2.0000 g | Freq: Four times a day (QID) | INTRAVENOUS | Status: AC
Start: 2020-06-21 — End: 2020-06-22
  Administered 2020-06-21 – 2020-06-22 (×2): 2 g via INTRAVENOUS
  Filled 2020-06-21 (×2): qty 100

## 2020-06-21 MED ORDER — BUPIVACAINE IN DEXTROSE 0.75-8.25 % IT SOLN
INTRATHECAL | Status: DC | PRN
  Administered 2020-06-21: 1.6 mL via INTRATHECAL

## 2020-06-21 MED ORDER — TRANEXAMIC ACID-NACL 1000-0.7 MG/100ML-% IV SOLN
1000.0000 mg | INTRAVENOUS | Status: AC
  Administered 2020-06-21: 1000 mg via INTRAVENOUS
  Filled 2020-06-21: qty 100

## 2020-06-21 MED ORDER — ONDANSETRON HCL 4 MG PO TABS: 4.0000 mg | ORAL_TABLET | Freq: Four times a day (QID) | ORAL | Status: DC | PRN

## 2020-06-21 MED ORDER — BUPIVACAINE HCL 0.25 % IJ SOLN
INTRAMUSCULAR | Status: AC
  Filled 2020-06-21: qty 1

## 2020-06-21 MED ORDER — ORAL CARE MOUTH RINSE: 15.0000 mL | Freq: Once | OROMUCOSAL | Status: AC

## 2020-06-21 MED ORDER — DEXAMETHASONE SODIUM PHOSPHATE 10 MG/ML IJ SOLN
8.0000 mg | Freq: Once | INTRAMUSCULAR | Status: AC
  Administered 2020-06-21: 8 mg via INTRAVENOUS

## 2020-06-21 MED ORDER — MORPHINE SULFATE (PF) 2 MG/ML IV SOLN
0.5000 mg | INTRAVENOUS | Status: DC | PRN
  Administered 2020-06-21 – 2020-06-22 (×2): 1 mg via INTRAVENOUS
  Filled 2020-06-21 (×2): qty 1

## 2020-06-21 MED ORDER — METOCLOPRAMIDE HCL 5 MG/ML IJ SOLN
5.0000 mg | Freq: Three times a day (TID) | INTRAMUSCULAR | Status: DC | PRN
Start: 2020-06-21 — End: 2020-06-22

## 2020-06-21 MED ORDER — MENTHOL 3 MG MT LOZG: 1.0000 | LOZENGE | OROMUCOSAL | Status: DC | PRN

## 2020-06-21 MED ORDER — ASPIRIN 325 MG PO TABS
325.0000 mg | ORAL_TABLET | Freq: Every day | ORAL | Status: DC
  Administered 2020-06-22: 325 mg via ORAL
  Filled 2020-06-21 (×2): qty 1

## 2020-06-21 MED ORDER — METHOCARBAMOL 500 MG PO TABS
500.0000 mg | ORAL_TABLET | Freq: Four times a day (QID) | ORAL | Status: DC | PRN
  Administered 2020-06-21: 500 mg via ORAL
  Filled 2020-06-21: qty 1

## 2020-06-21 MED ORDER — WATER FOR IRRIGATION, STERILE IR SOLN
Status: DC | PRN
  Administered 2020-06-21: 2000 mL

## 2020-06-21 MED ORDER — BISACODYL 10 MG RE SUPP: 10.0000 mg | Freq: Every day | RECTAL | Status: DC | PRN

## 2020-06-21 MED ORDER — ONDANSETRON HCL 4 MG/2ML IJ SOLN
INTRAMUSCULAR | Status: DC | PRN
  Administered 2020-06-21: 4 mg via INTRAVENOUS

## 2020-06-21 MED ORDER — LEVOTHYROXINE SODIUM 50 MCG PO TABS
50.0000 ug | ORAL_TABLET | Freq: Every day | ORAL | Status: DC
  Administered 2020-06-22: 50 ug via ORAL
  Filled 2020-06-21: qty 1

## 2020-06-21 MED ORDER — FENTANYL CITRATE (PF) 100 MCG/2ML IJ SOLN: 25.0000 ug | INTRAMUSCULAR | Status: DC | PRN

## 2020-06-21 MED ORDER — HYDROCODONE-ACETAMINOPHEN 5-325 MG PO TABS
1.0000 | ORAL_TABLET | ORAL | Status: DC | PRN
  Administered 2020-06-21: 2 via ORAL
  Filled 2020-06-21: qty 2

## 2020-06-21 MED ORDER — CHLORHEXIDINE GLUCONATE 0.12 % MT SOLN
15.0000 mL | Freq: Once | OROMUCOSAL | Status: AC
  Administered 2020-06-21: 15 mL via OROMUCOSAL

## 2020-06-21 MED ORDER — TAMSULOSIN HCL 0.4 MG PO CAPS
0.4000 mg | ORAL_CAPSULE | Freq: Every day | ORAL | Status: DC
  Administered 2020-06-22: 0.4 mg via ORAL
  Filled 2020-06-21: qty 1

## 2020-06-21 MED ORDER — ONDANSETRON HCL 4 MG/2ML IJ SOLN: 4.0000 mg | Freq: Four times a day (QID) | INTRAMUSCULAR | Status: DC | PRN

## 2020-06-21 MED ORDER — 0.9 % SODIUM CHLORIDE (POUR BTL) OPTIME
TOPICAL | Status: DC | PRN
  Administered 2020-06-21: 1000 mL

## 2020-06-21 MED ORDER — METOPROLOL TARTRATE 25 MG PO TABS
25.0000 mg | ORAL_TABLET | Freq: Once | ORAL | Status: AC
  Administered 2020-06-21: 25 mg via ORAL
  Filled 2020-06-21: qty 1

## 2020-06-21 MED ORDER — SODIUM CHLORIDE 0.9 % IV SOLN: INTRAVENOUS | Status: DC

## 2020-06-21 MED ORDER — POLYETHYLENE GLYCOL 3350 17 G PO PACK: 17.0000 g | PACK | Freq: Every day | ORAL | Status: DC | PRN

## 2020-06-21 MED ORDER — METHOCARBAMOL 1000 MG/10ML IJ SOLN
500.0000 mg | Freq: Four times a day (QID) | INTRAVENOUS | Status: DC | PRN
  Filled 2020-06-21: qty 5

## 2020-06-21 MED ORDER — TRAMADOL HCL 50 MG PO TABS
50.0000 mg | ORAL_TABLET | Freq: Four times a day (QID) | ORAL | Status: DC | PRN
  Administered 2020-06-21 – 2020-06-22 (×2): 100 mg via ORAL
  Filled 2020-06-21 (×2): qty 2

## 2020-06-21 MED ORDER — FENTANYL CITRATE (PF) 100 MCG/2ML IJ SOLN
INTRAMUSCULAR | Status: DC | PRN
  Administered 2020-06-21: 25 ug via INTRAVENOUS
  Administered 2020-06-21: 50 ug via INTRAVENOUS
  Administered 2020-06-21: 25 ug via INTRAVENOUS

## 2020-06-21 MED ORDER — ACETAMINOPHEN 325 MG PO TABS: 325.0000 mg | ORAL_TABLET | Freq: Four times a day (QID) | ORAL | Status: DC | PRN

## 2020-06-21 MED ORDER — LIDOCAINE HCL (CARDIAC) PF 100 MG/5ML IV SOSY
PREFILLED_SYRINGE | INTRAVENOUS | Status: DC | PRN
  Administered 2020-06-21: 50 mg via INTRAVENOUS

## 2020-06-21 MED ORDER — CEFAZOLIN SODIUM-DEXTROSE 2-4 GM/100ML-% IV SOLN
2.0000 g | INTRAVENOUS | Status: AC
  Administered 2020-06-21: 2 g via INTRAVENOUS
  Filled 2020-06-21: qty 100

## 2020-06-21 SURGICAL SUPPLY — 42 items
BAG DECANTER FOR FLEXI CONT (MISCELLANEOUS) IMPLANT
BAG ZIPLOCK 12X15 (MISCELLANEOUS) IMPLANT
BLADE SAG 18X100X1.27 (BLADE) ×2 IMPLANT
COVER PERINEAL POST (MISCELLANEOUS) ×2 IMPLANT
COVER SURGICAL LIGHT HANDLE (MISCELLANEOUS) ×2 IMPLANT
COVER WAND RF STERILE (DRAPES) IMPLANT
CUP ACET PINNACLE SECTR 50MM (Hips) ×1 IMPLANT
DECANTER SPIKE VIAL GLASS SM (MISCELLANEOUS) ×2 IMPLANT
DRAPE STERI IOBAN 125X83 (DRAPES) ×2 IMPLANT
DRAPE U-SHAPE 47X51 STRL (DRAPES) ×4 IMPLANT
DRSG AQUACEL AG ADV 3.5X10 (GAUZE/BANDAGES/DRESSINGS) ×2 IMPLANT
DURAPREP 26ML APPLICATOR (WOUND CARE) ×2 IMPLANT
ELECT REM PT RETURN 15FT ADLT (MISCELLANEOUS) ×2 IMPLANT
EVACUATOR 1/8 PVC DRAIN (DRAIN) IMPLANT
GLOVE SRG 8 PF TXTR STRL LF DI (GLOVE) ×1 IMPLANT
GLOVE SURG ENC MOIS LTX SZ6 (GLOVE) IMPLANT
GLOVE SURG ENC MOIS LTX SZ6.5 (GLOVE) ×2 IMPLANT
GLOVE SURG ENC MOIS LTX SZ8 (GLOVE) ×4 IMPLANT
GLOVE SURG ENC TEXT LTX SZ7 (GLOVE) IMPLANT
GLOVE SURG UNDER POLY LF SZ6.5 (GLOVE) IMPLANT
GLOVE SURG UNDER POLY LF SZ8 (GLOVE) ×2
GLOVE SURG UNDER POLY LF SZ8.5 (GLOVE) IMPLANT
GOWN STRL REUS W/TWL LRG LVL3 (GOWN DISPOSABLE) ×2 IMPLANT
GOWN STRL REUS W/TWL XL LVL3 (GOWN DISPOSABLE) IMPLANT
HEAD FEMORAL 32 CERAMIC (Hips) ×2 IMPLANT
KIT TURNOVER KIT A (KITS) ×2 IMPLANT
LINER MARATHON 32 50 (Hips) ×2 IMPLANT
MANIFOLD NEPTUNE II (INSTRUMENTS) ×2 IMPLANT
PACK ANTERIOR HIP CUSTOM (KITS) ×2 IMPLANT
PENCIL SMOKE EVACUATOR COATED (MISCELLANEOUS) ×2 IMPLANT
PINNACLE SECTOR CUP 50MM (Hips) ×2 IMPLANT
STEM FEM ACTIS HIGH SZ1 (Stem) ×2 IMPLANT
STRIP CLOSURE SKIN 1/2X4 (GAUZE/BANDAGES/DRESSINGS) ×2 IMPLANT
SUT ETHIBOND NAB CT1 #1 30IN (SUTURE) ×2 IMPLANT
SUT MNCRL AB 4-0 PS2 18 (SUTURE) ×2 IMPLANT
SUT STRATAFIX 0 PDS 27 VIOLET (SUTURE) ×2
SUT VIC AB 2-0 CT1 27 (SUTURE) ×4
SUT VIC AB 2-0 CT1 TAPERPNT 27 (SUTURE) ×2 IMPLANT
SUTURE STRATFX 0 PDS 27 VIOLET (SUTURE) ×1 IMPLANT
SYR 50ML LL SCALE MARK (SYRINGE) IMPLANT
TRAY CATH INTERMITTENT SS 16FR (CATHETERS) ×2 IMPLANT
TUBE SUCTION HIGH CAP CLEAR NV (SUCTIONS) ×2 IMPLANT

## 2020-06-21 NOTE — Op Note (Signed)
OPERATIVE REPORT- TOTAL HIP ARTHROPLASTY   PREOPERATIVE DIAGNOSIS: Osteoarthritis of the Right hip.   POSTOPERATIVE DIAGNOSIS: Osteoarthritis of the Right  hip.   PROCEDURE: Right total hip arthroplasty, anterior approach.   SURGEON: Gaynelle Arabian, MD   ASSISTANT: Theresa Duty, PA-C  ANESTHESIA:  Spinal  ESTIMATED BLOOD LOSS:-350 mL    DRAINS: Hemovac x1.   COMPLICATIONS: None   CONDITION: PACU - hemodynamically stable.   BRIEF CLINICAL NOTE: Corey Duran is a 70 y.o. male who has advanced end-  stage arthritis of their Right  hip with progressively worsening pain and  dysfunction.The patient has failed nonoperative management and presents for  total hip arthroplasty.   PROCEDURE IN DETAIL: After successful administration of spinal  anesthetic, the traction boots for the [facility] bed were placed on both  feet and the patient was placed onto the [facility] bed, boots placed into the leg  holders. The Right hip was then isolated from the perineum with plastic  drapes and prepped and draped in the usual sterile fashion. ASIS and  greater trochanter were marked and a oblique incision was made, starting  at about 1 cm lateral and 2 cm distal to the ASIS and coursing towards  the anterior cortex of the femur. The skin was cut with a 10 blade  through subcutaneous tissue to the level of the fascia overlying the  tensor fascia lata muscle. The fascia was then incised in line with the  incision at the junction of the anterior third and posterior 2/3rd. The  muscle was teased off the fascia and then the interval between the TFL  and the rectus was developed. The Hohmann retractor was then placed at  the top of the femoral neck over the capsule. The vessels overlying the  capsule were cauterized and the fat on top of the capsule was removed.  A Hohmann retractor was then placed anterior underneath the rectus  femoris to give exposure to the entire anterior capsule. A T-shaped   capsulotomy was performed. The edges were tagged and the femoral head  was identified.       Osteophytes are removed off the superior acetabulum.  The femoral neck was then cut in situ with an oscillating saw. Traction  was then applied to the left lower extremity utilizing the [facility]  traction. The femoral head was then removed. Retractors were placed  around the acetabulum and then circumferential removal of the labrum was  performed. Osteophytes were also removed. Reaming starts at 47 mm to  medialize and  Increased in 2 mm increments to 49 mm. We reamed in  approximately 40 degrees of abduction, 20 degrees anteversion. A 50 mm  pinnacle acetabular shell was then impacted in anatomic position under  fluoroscopic guidance with excellent purchase. We did not need to place  any additional dome screws. A 32 mm neutral + 4 marathon liner was then  placed into the acetabular shell.       The femoral lift was then placed along the lateral aspect of the femur  just distal to the vastus ridge. The leg was  externally rotated and capsule  was stripped off the inferior aspect of the femoral neck down to the  level of the lesser trochanter, this was done with electrocautery. The femur was lifted after this was performed. The  leg was then placed in an extended and adducted position essentially delivering the femur. We also removed the capsule superiorly and the piriformis from the piriformis  fossa to gain excellent exposure of the  proximal femur. Rongeur was used to remove some cancellous bone to get  into the lateral portion of the proximal femur for placement of the  initial starter reamer. The starter broaches was placed  the starter broach  and was shown to go down the center of the canal. Broaching  with the Actis system was then performed starting at size 0  coursing  Up to size 1. A size 1 had excellent torsional and rotational  and axial stability. The trial high offset neck was then placed   with a 32 + 1 trial head. The hip was then reduced. We confirmed that  the stem was in the canal both on AP and lateral x-rays. It also has excellent sizing. The hip was reduced with outstanding stability through full extension and full external rotation.. AP pelvis was taken and the leg lengths were measured and found to be equal. Hip was then dislocated again and the femoral head and neck removed. The  femoral broach was removed. Size 1 Actis stem with a high offset  neck was then impacted into the femur following native anteversion. Has  excellent purchase in the canal. Excellent torsional and rotational and  axial stability. It is confirmed to be in the canal on AP and lateral  fluoroscopic views. The 32 + 1 ceramic head was placed and the hip  reduced with outstanding stability. Again AP pelvis was taken and it  confirmed that the leg lengths were equal. The wound was then copiously  irrigated with saline solution and the capsule reattached and repaired  with Ethibond suture. 30 ml of .25% Bupivicaine was  injected into the capsule and into the edge of the tensor fascia lata as well as subcutaneous tissue. The fascia overlying the tensor fascia lata was then closed with a running #1 V-Loc. Subcu was closed with interrupted 2-0 Vicryl and subcuticular running 4-0 Monocryl. Incision was cleaned  and dried. Steri-Strips and a bulky sterile dressing applied. Hemovac  drain was hooked to suction and then the patient was awakened and transported to  recovery in stable condition.        Please note that a surgical assistant was a medical necessity for this procedure to perform it in a safe and expeditious manner. Assistant was necessary to provide appropriate retraction of vital neurovascular structures and to prevent femoral fracture and allow for anatomic placement of the prosthesis.  Gaynelle Arabian, M.D.

## 2020-06-21 NOTE — Anesthesia Preprocedure Evaluation (Addendum)
Anesthesia Evaluation  Patient identified by MRN, date of birth, ID band Patient awake    Reviewed: Allergy & Precautions, H&P , NPO status , Patient's Chart, lab work & pertinent test results, reviewed documented beta blocker date and time   Airway Mallampati: II  TM Distance: >3 FB Neck ROM: Full    Dental no notable dental hx. (+) Teeth Intact, Dental Advisory Given   Pulmonary sleep apnea , former smoker,    Pulmonary exam normal breath sounds clear to auscultation       Cardiovascular hypertension, Pt. on medications and Pt. on home beta blockers + CAD and + Cardiac Stents   Rhythm:Regular Rate:Normal     Neuro/Psych negative neurological ROS  negative psych ROS   GI/Hepatic negative GI ROS, Neg liver ROS,   Endo/Other  Hypothyroidism   Renal/GU negative Renal ROS  negative genitourinary   Musculoskeletal  (+) Arthritis , Osteoarthritis,    Abdominal   Peds  Hematology negative hematology ROS (+)   Anesthesia Other Findings   Reproductive/Obstetrics negative OB ROS                            Anesthesia Physical Anesthesia Plan  ASA: III  Anesthesia Plan: Spinal   Post-op Pain Management:    Induction: Intravenous  PONV Risk Score and Plan: 2 and Propofol infusion and Ondansetron  Airway Management Planned: Simple Face Mask  Additional Equipment:   Intra-op Plan:   Post-operative Plan:   Informed Consent: I have reviewed the patients History and Physical, chart, labs and discussed the procedure including the risks, benefits and alternatives for the proposed anesthesia with the patient or authorized representative who has indicated his/her understanding and acceptance.     Dental advisory given  Plan Discussed with: CRNA  Anesthesia Plan Comments:         Anesthesia Quick Evaluation

## 2020-06-21 NOTE — Transfer of Care (Signed)
Immediate Anesthesia Transfer of Care Note  Patient: Corey Duran  Procedure(s) Performed: TOTAL HIP ARTHROPLASTY ANTERIOR APPROACH (Right Hip)  Patient Location: PACU  Anesthesia Type:Spinal  Level of Consciousness: awake, alert , oriented and patient cooperative  Airway & Oxygen Therapy: Patient Spontanous Breathing and Patient connected to face mask oxygen  Post-op Assessment: Report given to RN and Post -op Vital signs reviewed and stable  Post vital signs: Reviewed and stable  Last Vitals:  Vitals Value Taken Time  BP    Temp    Pulse    Resp    SpO2      Last Pain:  Vitals:   06/21/20 0927  TempSrc:   PainSc: 0-No pain         Complications: No complications documented.

## 2020-06-21 NOTE — Anesthesia Postprocedure Evaluation (Signed)
Anesthesia Post Note  Patient: Corey Duran  Procedure(s) Performed: TOTAL HIP ARTHROPLASTY ANTERIOR APPROACH (Right Hip)     Patient location during evaluation: PACU Anesthesia Type: Spinal Level of consciousness: oriented and awake and alert Pain management: pain level controlled Vital Signs Assessment: post-procedure vital signs reviewed and stable Respiratory status: spontaneous breathing and respiratory function stable Cardiovascular status: blood pressure returned to baseline and stable Postop Assessment: no headache, no backache, no apparent nausea or vomiting, spinal receding and patient able to bend at knees Anesthetic complications: no   No complications documented.  Last Vitals:  Vitals:   06/21/20 1500 06/21/20 1515  BP: 103/68 105/65  Pulse: 62 62  Resp: 12 12  Temp:    SpO2: 93% 97%    Last Pain:  Vitals:   06/21/20 1515  TempSrc:   PainSc: 0-No pain                 [place] Giammona,W. EDMOND

## 2020-06-21 NOTE — Evaluation (Addendum)
Physical Therapy Evaluation Patient Details Name: Corey Duran MRN: [id number] DOB: 11-11-1950 Today's Date: 06/21/2020   History of Present Illness  Patient is a 70 y.o. male s/p Rt THA on 06/21/2020 with PMH significant for  hypercholesteremia, OA, coronary atherosclerosis (stent 2008), LBP, and Rt RTCR (2018).  Clinical Impression  Pt is a 70y.o. male s/p Rt THA POD 0. Pt reports that he is independent with mobility at baseline. Pt required MIN guard with cues for safe hand placement for sit to stand transfer. Pt required MIN assist progressing to MIN guard for safety with ambulation 46ft with verbal cues for RW management and step to gait pattern with no LOB. PT reviewed therapeutic intervention for promotion of DVT prevention, pt demonstrated understanding. Pt will have assistance from his wife and other family members upon discharge. Pt will benefit from skilled PT to increase independence and safety with mobility. Acute therapy to follow up during stay to progress functional mobility as able to ensure safe discharge home.       Follow Up Recommendations Follow surgeons recommendation for DC plan and follow-up therapies    Equipment Recommendations  None recommended by PT (pt owns RW)    Recommendations for Other Services       Precautions / Restrictions Precautions Precautions: Fall Restrictions Weight Bearing Restrictions: Yes RLE Weight Bearing: Weight bearing as tolerated      Mobility  Bed Mobility Overal bed mobility: Needs Assistance Bed Mobility: Supine to Sit     Supine to sit: Supervision;HOB elevated     General bed mobility comments: pt with use of B UEs to scoot to EOB with supervision for safety    Transfers Overall transfer level: Needs assistance Equipment used: Rolling walker (2 wheeled) Transfers: Sit to/from Stand Sit to Stand: Min guard         General transfer comment: MIN guard for safety with cues for safe hand  placement  Ambulation/Gait Ambulation/Gait assistance: Min assist;Min guard Gait Distance (Feet): 50 Feet Assistive device: Rolling walker (2 wheeled) Gait Pattern/deviations: Step-to pattern;Decreased stride length;Decreased weight shift to right Gait velocity: decr   General Gait Details: Pt performed pre-gait marching with use of B UEs on RW. MIN assist progressing to MIN guard for safety with cues for step to gait pattern with no LOB.  Stairs            Wheelchair Mobility    Modified Rankin (Stroke Patients Only)       Balance Overall balance assessment: Needs assistance Sitting-balance support: Feet supported Sitting balance-Leahy Scale: Good     Standing balance support: Bilateral upper extremity supported;During functional activity Standing balance-Leahy Scale: Poor Standing balance comment: use of RW for standing balance                             Pertinent Vitals/Pain Pain Assessment: 0-10 Pain Score: 4  Pain Location: Rt hip Pain Descriptors / Indicators: Discomfort;Sore Pain Intervention(s): Limited activity within patient's tolerance;Monitored during session;Repositioned;Ice applied    Home Living Family/patient expects to be discharged to:: Private residence Living Arrangements: Spouse/significant other Available Help at Discharge: Family Type of Home: House Home Access: Stairs to enter Entrance Stairs-Rails: None Entrance Stairs-Number of Steps: 3 Home Layout: One level Home Equipment: Environmental consultant - 2 wheels;Walker - 4 wheels Additional Comments: wife will assist at homeand other family available to help if needed    Prior Function Level of Independence: Independent  Hand Dominance   Dominant Hand: Right    Extremity/Trunk Assessment   Upper Extremity Assessment Upper Extremity Assessment: Overall WFL for tasks assessed    Lower Extremity Assessment Lower Extremity Assessment: RLE deficits/detail RLE  Deficits / Details: pt with godo Rt quad sets tsrength and 4+/5 B dorsi/plantar flexion strength. RLE Sensation: WNL RLE Coordination: WNL    Cervical / Trunk Assessment Cervical / Trunk Assessment: Normal  Communication   Communication: No difficulties  Cognition Arousal/Alertness: Awake/alert Behavior During Therapy: WFL for tasks assessed/performed Overall Cognitive Status: Within Functional Limits for tasks assessed                                        General Comments      Exercises Total Joint Exercises Ankle Circles/Pumps: AROM;Both;20 reps;Seated   Assessment/Plan    PT Assessment Patient needs continued PT services  PT Problem List Decreased strength;Decreased range of motion;Decreased activity tolerance;Decreased balance;Decreased mobility;Decreased knowledge of use of DME;Pain       PT Treatment Interventions DME instruction;Gait training;Stair training;Functional mobility training;Therapeutic activities;Therapeutic exercise;Balance training;Patient/family education    PT Goals (Current goals can be found in the Care Plan section)  Acute Rehab PT Goals Patient Stated Goal: get back to golfing and walking PT Goal Formulation: With patient/family Time For Goal Achievement: 06/28/20 Potential to Achieve Goals: Good    Frequency 7X/week   Barriers to discharge        Co-evaluation               AM-PAC PT "6 Clicks" Mobility  Outcome Measure Help needed turning from your back to your side while in a flat bed without using bedrails?: None Help needed moving from lying on your back to sitting on the side of a flat bed without using bedrails?: None Help needed moving to and from a bed to a chair (including a wheelchair)?: A Little Help needed standing up from a chair using your arms (e.g., wheelchair or bedside chair)?: A Little Help needed to walk in hospital room?: A Little Help needed climbing 3-5 steps with a railing? : A Lot 6  Click Score: 19    End of Session Equipment Utilized During Treatment: Gait belt Activity Tolerance: Patient tolerated treatment well Patient left: in chair;with call bell/phone within reach;with chair alarm set;with family/visitor present Nurse Communication: Mobility status PT Visit Diagnosis: Unsteadiness on feet (R26.81);Muscle weakness (generalized) (M62.81);Pain Pain - Right/Left: Right Pain - part of body: Hip    Time: 4742-5956 PT Time Calculation (min) (ACUTE ONLY): 23 min   Charges:   PT Evaluation $PT Eval Low Complexity: 1 Low PT Treatments $Gait Training: 8-22 mins        Izik Bingman, SPT  Acute rehab    Rashi Granier 06/21/2020, 6:50 PM

## 2020-06-21 NOTE — Interval H&P Note (Signed)
History and Physical Interval Note:  06/21/2020 10:37 AM  Corey Duran  has presented today for surgery, with the diagnosis of right hip osteoarthritis.  The various methods of treatment have been discussed with the patient and family. After consideration of risks, benefits and other options for treatment, the patient has consented to  Procedure(s) with comments: [place] (Right) - 11min as a surgical intervention.  The patient's history has been reviewed, patient examined, no change in status, stable for surgery.  I have reviewed the patient's chart and labs.  Questions were answered to the patient's satisfaction.     [name]

## 2020-06-21 NOTE — Anesthesia Procedure Notes (Signed)
Spinal  Patient location during procedure: OR Start time: 06/21/2020 12:08 PM End time: 06/21/2020 12:13 PM Reason for block: surgical anesthesia Staffing Performed: resident/CRNA  Resident/CRNA: Garrel Ridgel, CRNA Preanesthetic Checklist Completed: patient identified, IV checked, site marked, risks and benefits discussed, surgical consent, monitors and equipment checked, pre-op evaluation and timeout performed Spinal Block Patient position: sitting Prep: DuraPrep Patient monitoring: heart rate, cardiac monitor, continuous pulse ox and blood pressure Approach: midline Location: L4-5 Injection technique: single-shot Needle Needle type: Pencan  Needle gauge: 24 G Needle length: 9 cm Needle insertion depth: 5 cm Assessment Sensory level: T4 Events: CSF return

## 2020-06-21 NOTE — Discharge Instructions (Addendum)
Corey Arabian, MD Total Joint Specialist [facility] [address] [place], [place] [address] [phone]  ANTERIOR APPROACH TOTAL HIP REPLACEMENT POSTOPERATIVE DIRECTIONS     Hip Rehabilitation, Guidelines Following Surgery  The results of a hip operation are greatly improved after range of motion and muscle strengthening exercises. Follow all safety measures which are given to protect your hip. If any of these exercises cause increased pain or swelling in your joint, decrease the amount until you are comfortable again. Then slowly increase the exercises. Call your caregiver if you have problems or questions.   BLOOD CLOT PREVENTION  Take a 325 mg Aspirin once a day for three weeks following surgery with your usual Plavix dose. Then take an 81 mg Aspirin once a day for three weeks with your usual Plavix dose. Then discontinue Aspirin.  You may resume your vitamins/supplements upon discharge from the hospital.  Do not take any NSAIDs (Advil, Aleve, Ibuprofen, Meloxicam, etc.) until you have discontinued the 325 mg Aspirin.  HOME CARE INSTRUCTIONS   Remove items at home which could result in a fall. This includes throw rugs or furniture in walking pathways.   ICE to the affected hip as frequently as 20-30 minutes an hour and then as needed for pain and swelling. Continue to use ice on the hip for pain and swelling from surgery. You may notice swelling that will progress down to the foot and ankle. This is normal after surgery. Elevate the leg when you are not up walking on it.    Continue to use the breathing machine which will help keep your temperature down.  It is common for your temperature to cycle up and down following surgery, especially at night when you are not up moving around and exerting yourself.  The breathing machine keeps your lungs expanded and your temperature down.  DIET You may resume your previous home diet once your are discharged  from the hospital.  DRESSING / WOUND CARE / SHOWERING  You have an adhesive waterproof bandage over the incision. Leave this in place until your first follow-up appointment. Once you remove this you will not need to place another bandage.   You may begin showering 3 days following surgery, but do not submerge the incision under water.  ACTIVITY  For the first 3-5 days, it is important to rest and keep the operative leg elevated. You should, as a general rule, rest for 50 minutes and walk/stretch for 10 minutes per hour. After 5 days, you may slowly increase activity as tolerated.   Perform the exercises you were provided twice a day for about 15-20 minutes each session. Begin these 2 days following surgery.  Walk with your walker as instructed. Use the walker until you are comfortable transitioning to a cane. Walk with the cane in the opposite hand of the operative leg. You may discontinue the cane once you are comfortable and walking steadily.  Avoid periods of inactivity such as sitting longer than an hour when not asleep. This helps prevent blood clots.   Do not drive a car for 6 weeks or until released by your surgeon.   Do not drive while taking narcotics.  TED HOSE STOCKINGS Wear the elastic stockings on both legs for three weeks following surgery during the day. You may remove them at night while sleeping.  WEIGHT BEARING Weight bearing as tolerated with assist device (walker, cane, etc) as directed, use it as long as suggested by your surgeon or therapist, typically  at least 4-6 weeks.  POSTOPERATIVE CONSTIPATION PROTOCOL Constipation - defined medically as fewer than three stools per week and severe constipation as less than one stool per week.  One of the most common issues patients have following surgery is constipation.  Even if you have a regular bowel pattern at home, your normal regimen is likely to be disrupted due to multiple reasons following surgery.  Combination of  anesthesia, postoperative narcotics, change in appetite and fluid intake all can affect your bowels.  In order to avoid complications following surgery, here are some recommendations in order to help you during your recovery period.   Colace (docusate) - Pick up an over-the-counter form of Colace or another stool softener and take twice a day as long as you are requiring postoperative pain medications.  Take with a full glass of water daily.  If you experience loose stools or diarrhea, hold the colace until you stool forms back up.  If your symptoms do not get better within 1 week or if they get worse, check with your doctor.  Dulcolax (bisacodyl) - Pick up over-the-counter and take as directed by the product packaging as needed to assist with the movement of your bowels.  Take with a full glass of water.  Use this product as needed if not relieved by Colace only.   MiraLax (polyethylene glycol) - Pick up over-the-counter to have on hand.  MiraLax is a solution that will increase the amount of water in your bowels to assist with bowel movements.  Take as directed and can mix with a glass of water, juice, soda, coffee, or tea.  Take if you go more than two days without a movement.Do not use MiraLax more than once per day. Call your doctor if you are still constipated or irregular after using this medication for 7 days in a row.  If you continue to have problems with postoperative constipation, please contact the office for further assistance and recommendations.  If you experience "the worst abdominal pain ever" or develop nausea or vomiting, please contact the office immediatly for further recommendations for treatment.  ITCHING  If you experience itching with your medications, try taking only a single pain pill, or even half a pain pill at a time.  You can also use Benadryl over the counter for itching or also to help with sleep.   MEDICATIONS See your medication summary on the After Visit Summary  that the nursing staff will review with you prior to discharge.  You may have some home medications which will be placed on hold until you complete the course of blood thinner medication.  It is important for you to complete the blood thinner medication as prescribed by your surgeon.  Continue your approved medications as instructed at time of discharge.  PRECAUTIONS If you experience chest pain or shortness of breath - call 911 immediately for transfer to the hospital emergency department.  If you develop a fever greater that 101 F, purulent drainage from wound, increased redness or drainage from wound, foul odor from the wound/dressing, or calf pain - CONTACT YOUR SURGEON.                                                   FOLLOW-UP APPOINTMENTS Make sure you keep all of your appointments after your operation with your surgeon and caregivers. You  should call the office at the above phone number and make an appointment for approximately two weeks after the date of your surgery or on the date instructed by your surgeon outlined in the "After Visit Summary".  RANGE OF MOTION AND STRENGTHENING EXERCISES  These exercises are designed to help you keep full movement of your hip joint. Follow your caregiver's or physical therapist's instructions. Perform all exercises about fifteen times, three times per day or as directed. Exercise both hips, even if you have had only one joint replacement. These exercises can be done on a training (exercise) mat, on the floor, on a table or on a bed. Use whatever works the best and is most comfortable for you. Use music or television while you are exercising so that the exercises are a pleasant break in your day. This will make your life better with the exercises acting as a break in routine you can look forward to.   Lying on your back, slowly slide your foot toward your buttocks, raising your knee up off the floor. Then slowly slide your foot back down until your leg is  straight again.   Lying on your back spread your legs as far apart as you can without causing discomfort.   Lying on your side, raise your upper leg and foot straight up from the floor as far as is comfortable. Slowly lower the leg and repeat.   Lying on your back, tighten up the muscle in the front of your thigh (quadriceps muscles). You can do this by keeping your leg straight and trying to raise your heel off the floor. This helps strengthen the largest muscle supporting your knee.   Lying on your back, tighten up the muscles of your buttocks both with the legs straight and with the knee bent at a comfortable angle while keeping your heel on the floor.   IF YOU ARE TRANSFERRED TO A SKILLED REHAB FACILITY If the patient is transferred to a skilled rehab facility following release from the hospital, a list of the current medications will be sent to the facility for the patient to continue.  When discharged from the skilled rehab facility, please have the facility set up the patient's [place] prior to being released. Also, the skilled facility will be responsible for providing the patient with their medications at time of release from the facility to include their pain medication, the muscle relaxants, and their blood thinner medication. If the patient is still at the rehab facility at time of the two week follow up appointment, the skilled rehab facility will also need to assist the patient in arranging follow up appointment in our office and any transportation needs.  MAKE SURE YOU:   Understand these instructions.   Get help right away if you are not doing well or get worse.    DENTAL ANTIBIOTICS:  In most cases prophylactic antibiotics for Dental procdeures after total joint surgery are not necessary.  Exceptions are as follows:  1. History of prior total joint infection  2. Severely immunocompromised (Organ Transplant, cancer chemotherapy, Rheumatoid  biologic meds such as [place])  3. Poorly controlled diabetes (A1C &gt; 8.0, blood glucose over 200)  If you have one of these conditions, contact your surgeon for an antibiotic prescription, prior to your dental procedure.    Pick up stool softner and laxative for home use following surgery while on pain medications. Do not submerge incision under water. Please use good hand washing techniques while changing dressing  each day. May shower starting three days after surgery. Please use a clean towel to pat the incision dry following showers. Continue to use ice for pain and swelling after surgery. Do not use any lotions or creams on the incision until instructed by your surgeon.

## 2020-06-21 NOTE — Progress Notes (Signed)
Pt unable to void since after surgery. Pt c/o pressure on his bladder. Bladder scanned showed 600-75ml. Oncall Ortho made aware. Standing In and out protocol ordered. RN In and out patient and drained 7108ml of Urine. Pt stated he feels better. Will continue to monitor.

## 2020-06-21 NOTE — Plan of Care (Signed)
  Problem: Education: Goal: Knowledge of the prescribed therapeutic regimen will improve Outcome: Progressing Goal: Understanding of discharge needs will improve Outcome: Progressing Goal: Individualized Educational Video(s) Outcome: Progressing   

## 2020-06-22 ENCOUNTER — Encounter (HOSPITAL_COMMUNITY): Payer: Self-pay | Admitting: Orthopedic Surgery

## 2020-06-22 DIAGNOSIS — Z79899 Other long term (current) drug therapy: Secondary | ICD-10-CM | POA: Diagnosis not present

## 2020-06-22 DIAGNOSIS — I2510 Atherosclerotic heart disease of native coronary artery without angina pectoris: Secondary | ICD-10-CM | POA: Diagnosis not present

## 2020-06-22 DIAGNOSIS — M1611 Unilateral primary osteoarthritis, right hip: Secondary | ICD-10-CM | POA: Diagnosis not present

## 2020-06-22 DIAGNOSIS — Z87891 Personal history of nicotine dependence: Secondary | ICD-10-CM | POA: Diagnosis not present

## 2020-06-22 DIAGNOSIS — I10 Essential (primary) hypertension: Secondary | ICD-10-CM | POA: Diagnosis not present

## 2020-06-22 DIAGNOSIS — Z7982 Long term (current) use of aspirin: Secondary | ICD-10-CM | POA: Diagnosis not present

## 2020-06-22 DIAGNOSIS — E039 Hypothyroidism, unspecified: Secondary | ICD-10-CM | POA: Diagnosis not present

## 2020-06-22 LAB — CBC
HCT: 41.4 % (ref 39.0–52.0)
Hemoglobin: 14.2 g/dL (ref 13.0–17.0)
MCH: 31.8 pg (ref 26.0–34.0)
MCHC: 34.3 g/dL (ref 30.0–36.0)
MCV: 92.6 fL (ref 80.0–100.0)
Platelets: 167 10*3/uL (ref 150–400)
RBC: 4.47 MIL/uL (ref 4.22–5.81)
RDW: 11.8 % (ref 11.5–15.5)
WBC: 13.6 10*3/uL — ABNORMAL HIGH (ref 4.0–10.5)
nRBC: 0 % (ref 0.0–0.2)

## 2020-06-22 LAB — BASIC METABOLIC PANEL
Anion gap: 9 (ref 5–15)
BUN: 20 mg/dL (ref 8–23)
CO2: 23 mmol/L (ref 22–32)
Calcium: 8.6 mg/dL — ABNORMAL LOW (ref 8.9–10.3)
Chloride: 103 mmol/L (ref 98–111)
Creatinine, Ser: 0.83 mg/dL (ref 0.61–1.24)
GFR, Estimated: >60 mL/min (ref >60)
Glucose, Bld: 147 mg/dL — ABNORMAL HIGH (ref 70–99)
Potassium: 3.5 mmol/L (ref 3.5–5.1)
Sodium: 135 mmol/L (ref 135–145)

## 2020-06-22 MED ORDER — METHOCARBAMOL 500 MG PO TABS: 500.0000 mg | ORAL_TABLET | Freq: Four times a day (QID) | ORAL | 0 refills | Status: DC | PRN

## 2020-06-22 MED ORDER — SIMVASTATIN 40 MG PO TABS: 40.0000 mg | ORAL_TABLET | Freq: Every day | ORAL | Status: DC

## 2020-06-22 MED ORDER — ASPIRIN 325 MG PO TABS: 325.0000 mg | ORAL_TABLET | Freq: Two times a day (BID) | ORAL | 0 refills | Status: DC

## 2020-06-22 MED ORDER — CHLORHEXIDINE GLUCONATE CLOTH 2 % EX PADS: 6.0000 | MEDICATED_PAD | Freq: Every day | CUTANEOUS | Status: DC

## 2020-06-22 MED ORDER — EZETIMIBE 10 MG PO TABS: 5.0000 mg | ORAL_TABLET | Freq: Every day | ORAL | Status: DC

## 2020-06-22 MED ORDER — HYDROCODONE-ACETAMINOPHEN 5-325 MG PO TABS: 1.0000 | ORAL_TABLET | Freq: Four times a day (QID) | ORAL | 0 refills | Status: DC | PRN

## 2020-06-22 MED ORDER — TRAMADOL HCL 50 MG PO TABS: 50.0000 mg | ORAL_TABLET | Freq: Four times a day (QID) | ORAL | 0 refills | Status: DC | PRN

## 2020-06-22 NOTE — Progress Notes (Signed)
Patient still unable to void after straight cath last night. Pt is complaining a lot of pressure and pain in his bladder. Bladder scanned showed 500 ml. RN tried to do another straight cath but pt stated he preferred to the catheter to stay in him. Oncall Ortho informed and agreed about Foley Catheter. Foley Cath inserted. 600 ml Urine drained. Will continue to monitor.

## 2020-06-22 NOTE — Progress Notes (Signed)
Physical Therapy Treatment Patient Details Name: Corey Duran MRN: [id number] DOB: 08-21-1950 Today's Date: 06/22/2020    History of Present Illness Patient is a 70 y.o. male s/p Rt THA on 06/21/2020 with PMH significant for  hypercholesteremia, OA, coronary atherosclerosis (stent 2008), LBP, and Rt RTCR (2018).    PT Comments    POD # 1 am session Spouse present during session for [facility] Education.  Assisted OOB.  General bed mobility comments: demonstarted and instructed how to use belt to self assist LE.  Assisted with standing.  General transfer comment: 25% VC's on proper tech and safety with turns.  General Gait Details: 25% VC 's on proper sequencing as well as proper walker to self distance.  Tolerated an increased distance.  Instructed "walking was best exrecise - in moderation". Then returned to room to perform some TE's following HEP handout.  Instructed on proper tech, freq as well as use of ICE.   Discussed side sleeping positioning and use of pillows for comfort.   Pt will need another PT session to address stairs and finish HEP.   Follow Up Recommendations  Follow surgeon's recommendation for DC plan and follow-up therapies     Equipment Recommendations  None recommended by PT    Recommendations for Other Services       Precautions / Restrictions Precautions Precautions: Fall Restrictions Weight Bearing Restrictions: No    Mobility  Bed Mobility Overal bed mobility: Needs Assistance Bed Mobility: Supine to Sit     Supine to sit: Supervision;HOB elevated     General bed mobility comments: demonstarted and instructed how to use belt to self assist LE    Transfers Overall transfer level: Needs assistance Equipment used: Rolling walker (2 wheeled) Transfers: Sit to/from [employer] Transfers Sit to Stand: Supervision Stand pivot transfers: Supervision;Min guard       General transfer comment: 25% VC's on proper tech and safety with  turns  Ambulation/Gait Ambulation/Gait assistance: Supervision;Min guard Gait Distance (Feet): 65 Feet Assistive device: Rolling walker (2 wheeled) Gait Pattern/deviations: Step-to pattern;Decreased stride length;Decreased weight shift to right Gait velocity: decr   General Gait Details: 25% VC 's on proper sequencing as well as proper walker to self distance.  Tolerated an increased distance.  Instructed "walking was best exrecise - in moderation".   Stairs             Wheelchair Mobility    Modified Rankin (Stroke Patients Only)       Balance                                            Cognition Arousal/Alertness: Awake/alert Behavior During Therapy: WFL for tasks assessed/performed Overall Cognitive Status: Within Functional Limits for tasks assessed                                 General Comments: AxO x 3 very motivated      Exercises   Total Hip Replacement TE's following HEP Handout 10 reps ankle pumps 05 reps knee presses 05 reps heel slides 05 reps SAQ's 05 reps ABD Instructed how to use a belt loop to assist  Followed by ICE    General Comments        Pertinent Vitals/Pain Pain Assessment: 0-10 Pain Score: 6  Pain Location: Rt hip Pain Descriptors /  Indicators: Discomfort;Sore;Operative site guarding Pain Intervention(s): Monitored during session;Premedicated before session;Repositioned;Ice applied    Home Living                      Prior Function            PT Goals (current goals can now be found in the care plan section) Progress towards PT goals: Progressing toward goals    Frequency    7X/week      PT Plan Current plan remains appropriate    Co-evaluation              AM-PAC PT "6 Clicks" Mobility   Outcome Measure  Help needed turning from your back to your side while in a flat bed without using bedrails?: None Help needed moving from lying on your back to sitting on  the side of a flat bed without using bedrails?: None Help needed moving to and from a bed to a chair (including a wheelchair)?: None Help needed standing up from a chair using your arms (e.g., wheelchair or bedside chair)?: None Help needed to walk in hospital room?: A Little Help needed climbing 3-5 steps with a railing? : A Little 6 Click Score: 22    End of Session Equipment Utilized During Treatment: Gait belt Activity Tolerance: Patient tolerated treatment well Patient left: in chair;with call bell/phone within reach;with chair alarm set;with family/visitor present Nurse Communication: Mobility status PT Visit Diagnosis: Unsteadiness on feet (R26.81);Muscle weakness (generalized) (M62.81);Pain Pain - Right/Left: Right Pain - part of body: Hip     Time: 1120-1206 PT Time Calculation (min) (ACUTE ONLY): 46 min  Charges:  $Gait Training: 8-22 mins $Therapeutic Exercise: 8-22 mins $Therapeutic Activity: 8-22 mins                     Rica Koyanagi  PTA Acute  Rehabilitation Services Pager      [phone] Office      [phone]

## 2020-06-22 NOTE — Progress Notes (Signed)
Subjective: 1 Day Post-Op Procedure(s) (LRB): TOTAL HIP ARTHROPLASTY ANTERIOR APPROACH (Right) Patient reports pain as mild.   Patient seen in rounds by Dr. Wynelle Link. Patient is well, but has had some minor complaints of inability to void last night, resulting in foley catheter placement.  We will continue therapy today.   Objective: Vital signs in last 24 hours: Temp:  [97.5 F (36.4 C)-98.5 F (36.9 C)] 97.8 F (36.6 C) (03/24 0547) Pulse Rate:  [60-91] 72 (03/24 0547) Resp:  [12-18] 16 (03/24 0547) BP: (91-146)/(62-91) 115/75 (03/24 0547) SpO2:  [92 %-99 %] 94 % (03/24 0547) Weight:  [80.3 kg] 80.3 kg (03/23 1602)  Intake/Output from previous day:  Intake/Output Summary (Last 24 hours) at 06/22/2020 0830 Last data filed at 06/22/2020 0548 Gross per 24 hour  Intake 3461.6 ml  Output 1950 ml  Net 1511.6 ml     Intake/Output this shift: No intake/output data recorded.  Labs: Recent Labs    06/22/20 0255  HGB 14.2   Recent Labs    06/22/20 0255  WBC 13.6*  RBC 4.47  HCT 41.4  PLT 167   Recent Labs    06/22/20 0255  NA 135  K 3.5  CL 103  CO2 23  BUN 20  CREATININE 0.83  GLUCOSE 147*  CALCIUM 8.6*   No results for input(s): LABPT, INR in the last 72 hours.  Exam: General - Patient is Alert and Oriented Extremity - Neurologically intact Neurovascular intact Intact pulses distally Dorsiflexion/Plantar flexion intact Dressing - dressing C/D/I Motor Function - intact, moving foot and toes well on exam.   Past Medical History:  Diagnosis Date  . Arthritis   . BPH (benign prostatic hypertrophy)    followed by Dr. Reece Agar in the past; Now Dr. Diona Fanti  . Coronary atherosclerosis of native coronary artery    05/2006, stent LAD, patent 03/2007, study stent  . Heart murmur   . Hypercholesteremia   . Insomnia   . Laceration of leg    severe, 2009  . LBP (low back pain)   . OSA (obstructive sleep apnea)    PSG 07/23/10 ESS 11, AHI 16/hr REM  18/hr, RDI 17/hr REM 20/hr, O2 min 87%; CPAP 08/22/10 CPAP 10 with AHI 0  . Pneumonia    at age 68   . Tenosynovitis of finger    left third finger    Assessment/Plan: 1 Day Post-Op Procedure(s) (LRB): TOTAL HIP ARTHROPLASTY ANTERIOR APPROACH (Right) Principal Problem:   OA (osteoarthritis) of hip Active Problems:   Osteoarthritis of right hip  Estimated body mass index is 29.45 kg/m as calculated from the following:   Height as of this encounter: 5\' 5"  (1.651 m).   Weight as of this encounter: 80.3 kg. Up with therapy  DVT Prophylaxis - Aspirin  Weight bearing as tolerated. Continue therapy.  Patient has known history of BPH. Postoperatively he was unable to void on his own yesterday afternoon, resulting in multiple in/out catheters, and ultimately leading to foley catheter placement last evening. Bladder scan last night shoed 569ml, and then after foley was placed, 636ml was drained.   We will plan for patient to be discharged with foley catheter in place, and will contact patient's Urologist today to arrange for follow up appointment in one week to have the catheter pulled.   Plan for two sessions with PT today, and if meeting goals, will plan for discharge this afternoon.   The PDMP database was reviewed today prior to any opioid medications being  prescribed to this patient.  Plan is to go Home after hospital stay.  Fenton Foy, MBA, PA-C Orthopedic Surgery 06/22/2020, 8:30 AM

## 2020-06-22 NOTE — TOC Transition Note (Signed)
Transition of Care [facility]) - CM/SW Discharge Note   Patient Details  Name: Corey Duran MRN: [id number] Date of Birth: [birth year]  [facility] CM/SW Contact:  Lennart Pall, LCSW Phone Number: 06/22/2020, 9:37 AM   Clinical Narrative:    Met with pt and spouse today and confirming need for 3n1 commode - ordered via [place].  Plan for HEP at home.  No further TOC needs.   Final next level of care: Home/Self Care Barriers to Discharge: Continued Medical Work up   Patient Goals and CMS Choice Patient states their goals for this hospitalization and ongoing recovery are:: return home      Discharge Placement                       Discharge Plan and Services                DME Arranged: 3-N-1 DME Agency: [employer] Date DME Agency Contacted: 06/22/20 Time DME Agency Contacted: [phone] Representative spoke with at DME Agency: [place] ([place]) Interventions     Readmission Risk Interventions No flowsheet data found.

## 2020-06-22 NOTE — Progress Notes (Signed)
Physical Therapy Treatment Patient Details Name: Corey Duran MRN: [id number] DOB: Apr 24, 1950 Today's Date: 06/22/2020    History of Present Illness Patient is a 70 y.o. male s/p Rt THA on 06/21/2020 with PMH significant for  hypercholesteremia, OA, coronary atherosclerosis (stent 2008), LBP, and Rt RTCR (2018).    PT Comments    POD # 1 pm session. Assisted with amb in hallway an increased distance and practiced stairs.  Then returned to room to perform some TE's following HEP handout.  Instructed on proper tech, freq as well as use of ICE.   Pt has met goals to D/C to home   Follow Up Recommendations  Follow surgeon's recommendation for DC plan and follow-up therapies     Equipment Recommendations  None recommended by PT    Recommendations for Other Services       Precautions / Restrictions Precautions Precautions: Fall Restrictions Weight Bearing Restrictions: No    Mobility  Bed Mobility Overal bed mobility: Needs Assistance Bed Mobility: Supine to Sit     Supine to sit: Supervision;HOB elevated     General bed mobility comments: demonstarted and instructed how to use belt to self assist LE    Transfers Overall transfer level: Needs assistance Equipment used: Rolling walker (2 wheeled) Transfers: Sit to/from [employer] Transfers Sit to Stand: Supervision Stand pivot transfers: Supervision;Min guard       General transfer comment: 25% VC's on proper tech and safety with turns  Ambulation/Gait Ambulation/Gait assistance: Supervision;Min guard Gait Distance (Feet): 95 Feet Assistive device: Rolling walker (2 wheeled) Gait Pattern/deviations: Step-to pattern;Decreased stride length;Decreased weight shift to right Gait velocity: decr   General Gait Details: 25% VC 's on proper sequencing as well as proper walker to self distance.  Tolerated an increased distance.  Instructed "walking was best exrecise - in moderation".   Stairs Stairs:  Yes Stairs assistance: Min guard;Min assist Stair Management: No rails;Step to pattern;Forwards;With walker Number of Stairs: 3 General stair comments: practiced twice up 3 steps, no rails forwad with walker at 25% VC's on proper sequencing and safety with walker placement.  Also instructed to have spouse assist by securing holding walker.   Wheelchair Mobility    Modified Rankin (Stroke Patients Only)       Balance                                            Cognition Arousal/Alertness: Awake/alert Behavior During Therapy: WFL for tasks assessed/performed Overall Cognitive Status: Within Functional Limits for tasks assessed                                 General Comments: AxO x 3 very motivated      Exercises   05 reps all standing TE's following HEP handout    General Comments        Pertinent Vitals/Pain Pain Assessment: 0-10 Pain Score: 6  Pain Location: Rt hip Pain Descriptors / Indicators: Discomfort;Sore;Operative site guarding Pain Intervention(s): Monitored during session;Premedicated before session;Repositioned;Ice applied    Home Living                      Prior Function            PT Goals (current goals can now be found in the care plan section) Progress  towards PT goals: Progressing toward goals    Frequency    7X/week      PT Plan Current plan remains appropriate    Co-evaluation              AM-PAC PT "6 Clicks" Mobility   Outcome Measure  Help needed turning from your back to your side while in a flat bed without using bedrails?: None Help needed moving from lying on your back to sitting on the side of a flat bed without using bedrails?: None Help needed moving to and from a bed to a chair (including a wheelchair)?: None Help needed standing up from a chair using your arms (e.g., wheelchair or bedside chair)?: None Help needed to walk in hospital room?: A Little Help needed climbing  3-5 steps with a railing? : A Little 6 Click Score: 22    End of Session Equipment Utilized During Treatment: Gait belt Activity Tolerance: Patient tolerated treatment well Patient left: in chair;with call bell/phone within reach;with chair alarm set;with family/visitor present Nurse Communication: Mobility status PT Visit Diagnosis: Unsteadiness on feet (R26.81);Muscle weakness (generalized) (M62.81);Pain Pain - Right/Left: Right Pain - part of body: Hip     Time: 1275-1700 PT Time Calculation (min) (ACUTE ONLY): 34 min  Charges:  $Gait Training: 8-22 mins $Therapeutic Exercise: 8-22 mins                     Rica Koyanagi  PTA Acute  Rehabilitation Services Pager      [phone] Office      [phone]

## 2020-06-23 ENCOUNTER — Ambulatory Visit: Payer: Medicare Other | Admitting: Family Medicine

## 2020-06-23 DIAGNOSIS — R338 Other retention of urine: Secondary | ICD-10-CM | POA: Diagnosis not present

## 2020-06-26 ENCOUNTER — Emergency Department (HOSPITAL_COMMUNITY)
Admission: EM | Admit: 2020-06-26 | Discharge: 2020-06-26 | Disposition: A | Discharge status: Home / Self Care | Payer: Medicare Other | Attending: Emergency Medicine | Admitting: Emergency Medicine

## 2020-06-26 ENCOUNTER — Encounter (HOSPITAL_COMMUNITY): Payer: Self-pay

## 2020-06-26 ENCOUNTER — Other Ambulatory Visit: Payer: Self-pay

## 2020-06-26 DIAGNOSIS — R1084 Generalized abdominal pain: Secondary | ICD-10-CM | POA: Diagnosis not present

## 2020-06-26 DIAGNOSIS — E039 Hypothyroidism, unspecified: Secondary | ICD-10-CM | POA: Diagnosis not present

## 2020-06-26 DIAGNOSIS — K5641 Fecal impaction: Secondary | ICD-10-CM | POA: Insufficient documentation

## 2020-06-26 DIAGNOSIS — Z955 Presence of coronary angioplasty implant and graft: Secondary | ICD-10-CM | POA: Insufficient documentation

## 2020-06-26 DIAGNOSIS — Z7982 Long term (current) use of aspirin: Secondary | ICD-10-CM | POA: Insufficient documentation

## 2020-06-26 DIAGNOSIS — Z96641 Presence of right artificial hip joint: Secondary | ICD-10-CM | POA: Diagnosis not present

## 2020-06-26 DIAGNOSIS — K5900 Constipation, unspecified: Secondary | ICD-10-CM | POA: Diagnosis not present

## 2020-06-26 DIAGNOSIS — Z79899 Other long term (current) drug therapy: Secondary | ICD-10-CM | POA: Insufficient documentation

## 2020-06-26 DIAGNOSIS — R1030 Lower abdominal pain, unspecified: Secondary | ICD-10-CM | POA: Diagnosis not present

## 2020-06-26 DIAGNOSIS — Z87891 Personal history of nicotine dependence: Secondary | ICD-10-CM | POA: Insufficient documentation

## 2020-06-26 LAB — CBC WITH DIFFERENTIAL/PLATELET
Abs Immature Granulocytes: 0.08 10*3/uL — ABNORMAL HIGH (ref 0.00–0.07)
Basophils Absolute: 0 10*3/uL (ref 0.0–0.1)
Basophils Relative: 0 %
Eosinophils Absolute: 0 10*3/uL (ref 0.0–0.5)
Eosinophils Relative: 0 %
HCT: 40.9 % (ref 39.0–52.0)
Hemoglobin: 13.8 g/dL (ref 13.0–17.0)
Immature Granulocytes: 1 %
Lymphocytes Relative: 7 %
Lymphs Abs: 0.9 10*3/uL (ref 0.7–4.0)
MCH: 31.3 pg (ref 26.0–34.0)
MCHC: 33.7 g/dL (ref 30.0–36.0)
MCV: 92.7 fL (ref 80.0–100.0)
Monocytes Absolute: 0.7 10*3/uL (ref 0.1–1.0)
Monocytes Relative: 5 %
Neutro Abs: 11.5 10*3/uL — ABNORMAL HIGH (ref 1.7–7.7)
Neutrophils Relative %: 87 %
Platelets: 196 10*3/uL (ref 150–400)
RBC: 4.41 MIL/uL (ref 4.22–5.81)
RDW: 11.6 % (ref 11.5–15.5)
WBC: 13.2 10*3/uL — ABNORMAL HIGH (ref 4.0–10.5)
nRBC: 0 % (ref 0.0–0.2)

## 2020-06-26 LAB — BASIC METABOLIC PANEL
Anion gap: 8 (ref 5–15)
BUN: 21 mg/dL (ref 8–23)
CO2: 26 mmol/L (ref 22–32)
Calcium: 8.8 mg/dL — ABNORMAL LOW (ref 8.9–10.3)
Chloride: 99 mmol/L (ref 98–111)
Creatinine, Ser: 0.93 mg/dL (ref 0.61–1.24)
GFR, Estimated: >60 mL/min (ref >60)
Glucose, Bld: 110 mg/dL — ABNORMAL HIGH (ref 70–99)
Potassium: 4 mmol/L (ref 3.5–5.1)
Sodium: 133 mmol/L — ABNORMAL LOW (ref 135–145)

## 2020-06-26 MED ORDER — ONDANSETRON 4 MG PO TBDP
4.0000 mg | ORAL_TABLET | Freq: Once | ORAL | Status: AC
  Administered 2020-06-26: 4 mg via ORAL
  Filled 2020-06-26: qty 1

## 2020-06-26 MED ORDER — FENTANYL CITRATE (PF) 100 MCG/2ML IJ SOLN
50.0000 ug | Freq: Once | INTRAMUSCULAR | Status: AC
Start: 2020-06-26 — End: 2020-06-26
  Administered 2020-06-26: 50 ug via INTRAMUSCULAR
  Filled 2020-06-26: qty 2

## 2020-06-26 NOTE — Discharge Instructions (Signed)
I am glad that we were able to resolve your fecal impaction here in the ED.  I cannot emphasize enough the importance of increased oral hydration and high-fiber diet.  Continue take your stool softeners, as directed.  Take the tramadol only as needed for breakthrough pain.  You may also take Tylenol for pain symptoms.  Do not take any NSAIDs as you are on aspirin and Plavix.  If you have continued anal spasm pain after today's disimpaction, it is likely tenesmus -essentially residual anal pain and feeling as though you need to defecate.  I assure you that we are able to evacuate the stool in the rectum.  Your symptoms should improve with increased stool softeners, hydration, and good diet.  Return to the ER or seek immediate medical attention should you develop any fevers or chills, severe abdominal pain, intractable nausea and vomiting, or any other new or worsening symptoms.

## 2020-06-26 NOTE — ED Provider Notes (Signed)
[facility] DEPT Provider Note   CSN: [id number] Arrival date & time: 06/26/20  1926     History Chief Complaint  Patient presents with  . Abdominal Pain    Corey Duran is a 70 y.o. male with past medical history of osteoarthritis who is s/p right hip arthroplasty performed 06/21/2020 who presents to the ED with complaints of constipation.    I reviewed his PDMP and there does not appear to be any narcotics that were filled recently.  He states that he has been taking tramadol at home, as directed.  Patient is accompanied by his wife was at bedside.  Evidently he has not passed any bowel movement since his surgery.  He reports that he is still able to pass gas.  Patient does endorse diminished appetite and p.o. intake.  He has a Foley catheter placed and is still passing urine.  He has been taking Dulcolax twice daily and tried magnesium citrate earlier today.  Denies any fevers, chills, abdominal pain, nausea or vomiting, or other symptoms.   HPI     Past Medical History:  Diagnosis Date  . Arthritis   . BPH (benign prostatic hypertrophy)    followed by Dr. Reece Agar in the past; Now Dr. Diona Fanti  . Coronary atherosclerosis of native coronary artery    05/2006, stent LAD, patent 03/2007, study stent  . Heart murmur   . Hypercholesteremia   . Insomnia   . Laceration of leg    severe, 2009  . LBP (low back pain)   . OSA (obstructive sleep apnea)    PSG 07/23/10 ESS 11, AHI 16/hr REM 18/hr, RDI 17/hr REM 20/hr, O2 min 87%; CPAP 08/22/10 CPAP 10 with AHI 0  . Pneumonia    at age 62   . Tenosynovitis of finger    left third finger    Patient Active Problem List   Diagnosis Date Noted  . OA (osteoarthritis) of hip 06/21/2020  . Osteoarthritis of right hip 06/21/2020  . Hypothyroidism 08/13/2018  . Genital herpes 07/29/2017  . Low back pain 04/07/2015  . Bradycardia 01/23/2015  . Essential hypertension, benign 12/22/2013  . OSA  (obstructive sleep apnea) 12/22/2013  . Hypercholesteremia   . Coronary atherosclerosis of native coronary artery     Past Surgical History:  Procedure Laterality Date  . CORONARY ANGIOPLASTY     stent- 05/2006   . ROTATOR CUFF REPAIR Right 0918/2018  . TOTAL HIP ARTHROPLASTY Right 06/21/2020   Procedure: TOTAL HIP ARTHROPLASTY ANTERIOR APPROACH;  Surgeon: Gaynelle Arabian, MD;  Location: [facility] ORS;  Service: Orthopedics;  Laterality: Right;  158min       Family History  Problem Relation Age of Onset  . Dementia Mother   . Ulcers Father   . Hypertension Father   . CAD Father   . Heart attack Father   . Stroke Neg Hx     Social History   Tobacco Use  . Smoking status: Former Smoker    Packs/day: 0.25    Years: 10.00    Pack years: 2.50    Types: Cigarettes    Quit date: 2000    Years since quitting: 22.2  . Smokeless tobacco: Never Used  Vaping Use  . Vaping Use: Never used  Substance Use Topics  . Alcohol use: Yes    Comment: occ   . Drug use: No    Home Medications Prior to Admission medications   Medication Sig Start Date End Date Taking? Authorizing Provider  aspirin 325 MG tablet Take 1 tablet (325 mg total) by mouth 2 (two) times daily. Then take one 81 mg aspirin once a day for three weeks. Then discontinue aspirin. 06/22/20   Fenton Foy D, PA-C  BEE POLLEN PO Take 1-2 capsules by mouth See admin instructions. Take 2 capsule by mouth in the morning and 1 capsule in the evening    [provider]  Cholecalciferol (VITAMIN D) 50 MCG (2000 UT) CAPS Take 2,000 Units by mouth daily.    [provider]  clopidogrel (PLAVIX) 75 MG tablet TAKE 1 TABLET(75 MG) BY MOUTH DAILY Patient taking differently: Take 75 mg by mouth daily. 05/08/20   Dettinger, Fransisca Kaufmann, MD  Coenzyme Q10 (COQ10) 100 MG CAPS Take 100 mg by mouth daily.    [provider]  ezetimibe-simvastatin (VYTORIN) 10-80 MG tablet TAKE 1/2 TABLET BY MOUTH DAILY Patient taking  differently: Take 0.5 tablets by mouth daily. 05/08/20   Dettinger, Fransisca Kaufmann, MD  finasteride (PROSCAR) 5 MG tablet TAKE 1 TABLET(5 MG) BY MOUTH DAILY Patient taking differently: Take 5 mg by mouth daily. 05/08/20   Dettinger, Fransisca Kaufmann, MD  Glucosamine-Chondroitin (MOVE FREE PO) Take 1 tablet by mouth daily.    [provider]  HYDROcodone-acetaminophen (NORCO/VICODIN) 5-325 MG tablet Take 1-2 tablets by mouth every 6 (six) hours as needed for severe pain. 06/22/20   Jonnie Kind, PA-C  levothyroxine (SYNTHROID) 50 MCG tablet TAKE 1 TABLET BY MOUTH DAILY Patient taking differently: Take 50 mcg by mouth daily before breakfast. 08/12/19   Dettinger, Fransisca Kaufmann, MD  methocarbamol (ROBAXIN) 500 MG tablet Take 1 tablet (500 mg total) by mouth every 6 (six) hours as needed for muscle spasms. 06/22/20   Fenton Foy D, PA-C  metoprolol tartrate (LOPRESSOR) 25 MG tablet TAKE 1 TABLET(25 MG) BY MOUTH TWICE DAILY Patient taking differently: Take 25 mg by mouth 2 (two) times daily. 05/08/20   Dettinger, Fransisca Kaufmann, MD  Multiple Vitamins-Minerals (MULTIVITAMIN PO) Take 1 tablet by mouth daily.    [provider]  nitroGLYCERIN (NITROSTAT) 0.4 MG SL tablet Place 1 tablet (0.4 mg total) under the tongue every 5 (five) minutes as needed for chest pain. 04/06/19   Jettie Booze, MD  Omega-3 Fatty Acids (FISH OIL) 1000 MG CAPS Take 1,000 mg by mouth every evening.    [provider]  OVER THE COUNTER MEDICATION Take 3 capsules by mouth every morning. Prostagenix    [provider]  tamsulosin (FLOMAX) 0.4 MG CAPS capsule TAKE 1 CAPSULE(0.4 MG) BY MOUTH DAILY Patient taking differently: Take 0.4 mg by mouth daily. 05/08/20   Dettinger, Fransisca Kaufmann, MD  traMADol (ULTRAM) 50 MG tablet Take 1-2 tablets (50-100 mg total) by mouth every 6 (six) hours as needed for moderate pain. 06/22/20   Fenton Foy D, PA-C  TURMERIC CURCUMIN PO Take 1,000 mg by mouth daily.    [provider]   UNABLE TO FIND Med Name: Move Free supp OTC - one a day    [provider]  valACYclovir (VALTREX) 500 MG tablet Take 500 mg by mouth daily as needed (outbreaks).    [provider]  Zinc 50 MG CAPS Take 50 mg by mouth daily.    [provider]    Allergies    Patient has no known allergies.  Review of Systems   Review of Systems  All other systems reviewed and are negative.   Physical Exam Updated Vital Signs BP 131/81 (BP Location:  Left Arm)   Pulse 74   Temp 98.1 F (36.7 C) (Axillary)   Resp 20   Ht 5\' 5"  (1.651 m)   Wt 80.3 kg   SpO2 96%   BMI 29.46 kg/m   Physical Exam  ED Results / Procedures / Treatments   Labs (all labs ordered are listed, but only abnormal results are displayed) Labs Reviewed  CBC WITH DIFFERENTIAL/PLATELET - Abnormal; Notable for the following components:      Result Value   WBC 13.2 (*)    Neutro Abs 11.5 (*)    Abs Immature Granulocytes 0.08 (*)    All other components within normal limits  BASIC METABOLIC PANEL - Abnormal; Notable for the following components:   Sodium 133 (*)    Glucose, Bld 110 (*)    Calcium 8.8 (*)    All other components within normal limits    EKG None  Radiology No results found.  Procedures Fecal disimpaction  Date/Time: 06/26/2020 10:26 PM Performed by: Corena Herter, PA-C Authorized by: Corena Herter, PA-C  Consent: Verbal consent obtained. Consent given by: patient and spouse Site marked: the operative site was not marked Patient tolerance: patient tolerated the procedure well with no immediate complications Comments: I was able to extract a large stool burden from rectum.      Medications Ordered in ED Medications  fentaNYL (SUBLIMAZE) injection 50 mcg (50 mcg Intramuscular Given 06/26/20 2020)  ondansetron (ZOFRAN-ODT) disintegrating tablet 4 mg (4 mg Oral Given 06/26/20 2021)    ED Course  I have reviewed the triage vital signs and the nursing  notes.  Pertinent labs & imaging results that were available during my care of the patient were reviewed by me and considered in my medical decision making (see chart for details).    MDM Rules/Calculators/A&P                          Corey Duran was evaluated in Emergency Department on 06/26/2020 for the symptoms described in the history of present illness. He was evaluated in the context of the global COVID-19 pandemic, which necessitated consideration that the patient might be at risk for infection with the SARS-CoV-2 virus that causes COVID-19. Institutional protocols and algorithms that pertain to the evaluation of patients at risk for COVID-19 are in a state of rapid change based on information released by regulatory bodies including the CDC and federal and state organizations. These policies and algorithms were followed during the patient's care in the ED.  I personally reviewed patient's medical chart and all notes from triage and staff during today's encounter. I have also ordered and reviewed all labs and imaging that I felt to be medically necessary in the evaluation of this patient's complaints and with consideration of their physical exam. If needed, translation services were available and utilized.   Patient with mild leukocytosis here in the ED with Iser blood cell count of 13.2.  This is actually mildly improved when compared to recent labs obtained 4 days ago and is not unremarkable in setting of recent right-sided hip arthroplasty.  His right hip is without any significant tenderness, areas of induration, or other findings concerning for cellulitis versus infection.  He is able to move his leg, albeit with expected post-op discomfort.    His primary area of discomfort is in his rectum.  He states that he is blocked up.  His wife states that she saw him start to pass  stool, but then it retracted.  Suspect and element of tenesmus.  There is no abdominal tenderness on my  examination.  There is a palpable stool burden in the rectum that I can palpate and visualize on my examination.  I attempted fecal disimpaction and was able to break up a large amount of the stool with my finger.  We will proceed with soapsuds enema.  I was then called back into the room because he was "crowning".  I was able to take out a large stool burden.  Patient tolerated the procedure well.  On subsequent evaluation, patient is feeling entirely improved.  He states that his pain symptoms have resolved entirely with the disimpaction.  Discussed increased oral hydration, high-fiber diet, and titrating his stool softeners and laxatives as needed for constipation symptoms.  ER return precautions discussed, including but not limited to fevers, chills, severe abdominal pain, and intractable nausea and vomiting.  Patient and wife at bedside voiced understanding and are agreeable.  Final Clinical Impression(s) / ED Diagnoses Final diagnoses:  Fecal impaction in rectum [facility])    Rx / DC Orders ED Discharge Orders    None       Reita Chard 06/26/20 [id number]    Carmin Muskrat, MD 06/27/20 [phone]

## 2020-06-26 NOTE — ED Triage Notes (Signed)
Pt to ED by EMS from home with c/o lower abdominal pain due to constipation. Pt had hip surgery a week ago and has been taking his prescribed pain meds since. 7 days since last BM.

## 2020-06-28 NOTE — Discharge Summary (Signed)
Physician Discharge Summary   Patient ID: Corey Duran MRN: [id number] DOB/AGE: 10/25/1950 70 y.o.  Admit date: 06/21/2020 Discharge date: 06/22/2020  Primary Diagnosis: Osteoarthritis of Right Hip  Admission Diagnoses:  Past Medical History:  Diagnosis Date  . Arthritis   . BPH (benign prostatic hypertrophy)    followed by Dr. Reece Agar in the past; Now Dr. Diona Fanti  . Coronary atherosclerosis of native coronary artery    05/2006, stent LAD, patent 03/2007, study stent  . Heart murmur   . Hypercholesteremia   . Insomnia   . Laceration of leg    severe, 2009  . LBP (low back pain)   . OSA (obstructive sleep apnea)    PSG 07/23/10 ESS 11, AHI 16/hr REM 18/hr, RDI 17/hr REM 20/hr, O2 min 87%; CPAP 08/22/10 CPAP 10 with AHI 0  . Pneumonia    at age 70   . Tenosynovitis of finger    left third finger   Discharge Diagnoses:   Principal Problem:   OA (osteoarthritis) of hip Active Problems:   Osteoarthritis of right hip  Estimated body mass index is 29.45 kg/m as calculated from the following:   Height as of this encounter: 5\' 5"  (1.651 m).   Weight as of this encounter: 80.3 kg.  Procedure:  Procedure(s) (LRB): TOTAL HIP ARTHROPLASTY ANTERIOR APPROACH (Right)   Consults: None  HPI: Corey Duran is a 70 y.o. male who has advanced end-  stage arthritis of their Right  hip with progressively worsening pain and  dysfunction.The patient has failed nonoperative management and presents for  total hip arthroplasty.   Laboratory Data: Admission on 06/21/2020, Discharged on 06/22/2020  Component Date Value Ref Range Status  . WBC 06/22/2020 13.6* 4.0 - 10.5 K/uL Final  . RBC 06/22/2020 4.47  4.22 - 5.81 MIL/uL Final  . Hemoglobin 06/22/2020 14.2  13.0 - 17.0 g/dL Final  . HCT 06/22/2020 41.4  39.0 - 52.0 % Final  . MCV 06/22/2020 92.6  80.0 - 100.0 fL Final  . MCH 06/22/2020 31.8  26.0 - 34.0 pg Final  . MCHC 06/22/2020 34.3  30.0 - 36.0 g/dL Final  . RDW 06/22/2020  11.8  11.5 - 15.5 % Final  . Platelets 06/22/2020 167  150 - 400 K/uL Final  . nRBC 06/22/2020 0.0  0.0 - 0.2 % Final   Performed at [facility], [place] [address], [place], [place] [address]  . Sodium 06/22/2020 135  135 - 145 mmol/L Final  . Potassium 06/22/2020 3.5  3.5 - 5.1 mmol/L Final  . Chloride 06/22/2020 103  98 - 111 mmol/L Final  . CO2 06/22/2020 23  22 - 32 mmol/L Final  . Glucose, Bld 06/22/2020 147* 70 - 99 mg/dL Final   Glucose reference range applies only to samples taken after fasting for at least 8 hours.  . BUN 06/22/2020 20  8 - 23 mg/dL Final  . Creatinine, Ser 06/22/2020 0.83  0.61 - 1.24 mg/dL Final  . Calcium 06/22/2020 8.6* 8.9 - 10.3 mg/dL Final  . GFR, Estimated 06/22/2020 >60  >60 mL/min Final   Comment: (NOTE) Calculated using the CKD-EPI Creatinine Equation (2021)   . Anion gap 06/22/2020 9  5 - 15 Final   Performed at [facility], [place] [address], [place], [place] [address]  Hospital Outpatient Visit on 06/17/2020  Component Date Value Ref Range Status  . SARS Coronavirus 2 06/17/2020 NEGATIVE  NEGATIVE Final   Comment: (NOTE) SARS-CoV-2 target nucleic acids  are NOT DETECTED.  The SARS-CoV-2 RNA is generally detectable in upper and lower respiratory specimens during the acute phase of infection. Negative results do not preclude SARS-CoV-2 infection, do not rule out co-infections with other pathogens, and should not be used as the sole basis for treatment or other patient management decisions. Negative results must be combined with clinical observations, patient history, and epidemiological information. The expected result is Negative.  Fact Sheet for Patients: SugarRoll.be  Fact Sheet for Healthcare Providers: https://www.woods-mathews.com/  This test is not yet approved or cleared by the [place] FDA and  has been authorized for detection and/or diagnosis of  SARS-CoV-2 by FDA under an Emergency Use Authorization (EUA). This EUA will remain  in effect (meaning this test can be used) for the duration of the COVID-19 declaration under Se                          ction 564(b)(1) of the Act, 21 U.S.C. section 360bbb-3(b)(1), unless the authorization is terminated or revoked sooner.  Performed at [facility], [place] [address], [place], [place] [address]   Hospital Outpatient Visit on 06/14/2020  Component Date Value Ref Range Status  . WBC 06/14/2020 5.6  4.0 - 10.5 K/uL Final  . RBC 06/14/2020 5.16  4.22 - 5.81 MIL/uL Final  . Hemoglobin 06/14/2020 16.3  13.0 - 17.0 g/dL Final  . HCT 06/14/2020 48.1  39.0 - 52.0 % Final  . MCV 06/14/2020 93.2  80.0 - 100.0 fL Final  . MCH 06/14/2020 31.6  26.0 - 34.0 pg Final  . MCHC 06/14/2020 33.9  30.0 - 36.0 g/dL Final  . RDW 06/14/2020 11.5  11.5 - 15.5 % Final  . Platelets 06/14/2020 171  150 - 400 K/uL Final  . nRBC 06/14/2020 0.0  0.0 - 0.2 % Final   Performed at [facility], [place] [address] [place], [place] [address]  . Sodium 06/14/2020 138  135 - 145 mmol/L Final  . Potassium 06/14/2020 3.8  3.5 - 5.1 mmol/L Final  . Chloride 06/14/2020 106  98 - 111 mmol/L Final  . CO2 06/14/2020 25  22 - 32 mmol/L Final  . Glucose, Bld 06/14/2020 104* 70 - 99 mg/dL Final   Glucose reference range applies only to samples taken after fasting for at least 8 hours.  . BUN 06/14/2020 29* 8 - 23 mg/dL Final  . Creatinine, Ser 06/14/2020 0.94  0.61 - 1.24 mg/dL Final  . Calcium 06/14/2020 9.2  8.9 - 10.3 mg/dL Final  . Total Protein 06/14/2020 7.1  6.5 - 8.1 g/dL Final  . Albumin 06/14/2020 4.3  3.5 - 5.0 g/dL Final  . AST 06/14/2020 27  15 - 41 U/L Final  . ALT 06/14/2020 30  0 - 44 U/L Final  . Alkaline Phosphatase 06/14/2020 59  38 - 126 U/L Final  . Total Bilirubin 06/14/2020 0.9  0.3 - 1.2 mg/dL Final  . GFR, Estimated 06/14/2020 >60  >60 mL/min Final   Comment: (NOTE) Calculated  using the CKD-EPI Creatinine Equation (2021)   . Anion gap 06/14/2020 7  5 - 15 Final   Performed at [facility], [place] [address] [place], [place] [address]  . Prothrombin Time 06/14/2020 13.6  11.4 - 15.2 seconds Final  . INR 06/14/2020 1.1  0.8 - 1.2 Final   Comment: (NOTE) INR goal varies based on device and disease states. Performed at [facility], [address] [place], [place] [address]   . aPTT 06/14/2020 30  24 - 36 seconds Final   Performed at [facility], [place] [address], [place], [place] [address]  . ABO/RH(D) 06/14/2020 O POS   Final  . Antibody Screen 06/14/2020 NEG   Final  . Sample Expiration 06/14/2020 06/24/2020,2359   Final  . Extend sample reason 06/14/2020    Final                   Value:NO TRANSFUSIONS OR PREGNANCY IN THE PAST 3 MONTHS Performed at [place] [address] [place], [place] [address]   . MRSA, PCR 06/14/2020 NEGATIVE  NEGATIVE Final  . Staphylococcus aureus 06/14/2020 NEGATIVE  NEGATIVE Final   Comment: (NOTE) The Xpert SA Assay (FDA approved for NASAL specimens in patients 37 years of age and older), is one component of a comprehensive surveillance program. It is not intended to diagnose infection nor to guide or monitor treatment. Performed at [facility], [place] [address] [place], [place] [address]      X-Rays:DG Pelvis Portable  Result Date: 06/21/2020 CLINICAL DATA:  Postop hip surgery. EXAM: PORTABLE PELVIS 1-2 VIEWS COMPARISON:  Same day 1209 hours. FINDINGS: Right total hip arthroplasty. Subcutaneous and joint air is present. Left hip is unremarkable. IMPRESSION: Right total hip arthroplasty with expected postoperative findings. Electronically Signed   By: Lorin Picket M.D.   On: 06/21/2020 14:44   DG C-Arm 1-60 Min-No Report  Result Date: 06/21/2020 Fluoroscopy was utilized by the requesting physician.  No radiographic  interpretation.   DG HIP OPERATIVE UNILAT W OR W/O PELVIS RIGHT  Result Date: 06/21/2020 CLINICAL DATA:  Right hip arthroplasty EXAM: OPERATIVE RIGHT HIP (WITH PELVIS IF PERFORMED) INTRAOPERATIVE VIEWS TECHNIQUE: Fluoroscopic spot image(s) were submitted for interpretation post-operatively. COMPARISON:  08/13/2018 FINDINGS: 6 C-arm fluoroscopic images were obtained intraoperatively and submitted for post operative interpretation. Images demonstrate placement of right total hip arthroplasty hardware. Arthroplasty components appear within their expected alignment on final image. No obvious intraoperative complication. 9 seconds of fluoroscopy time was utilized. Please see the performing provider's procedural report for further detail. IMPRESSION: As above. Electronically Signed   By: Davina Poke D.O.   On: 06/21/2020 14:18    EKG: Orders placed or performed in visit on 04/05/20  . EKG 12-Lead     Hospital Course: Corey Duran is a 70 y.o. who was admitted to [facility]. They were brought to the operating room on 06/21/2020 and underwent Procedure(s): [place].  Patient tolerated the procedure well and was later transferred to the recovery room and then to the orthopaedic floor for postoperative care. They were given PO and IV analgesics for pain control following their surgery. They were given 24 hours of postoperative antibiotics of  Anti-infectives (From admission, onward)   Start     Dose/Rate Route Frequency Ordered Stop   06/21/20 1800  ceFAZolin (ANCEF) IVPB 2g/100 mL premix        2 g 200 mL/hr over 30 Minutes Intravenous Every 6 hours 06/21/20 1554 06/22/20 0130   06/21/20 0915  ceFAZolin (ANCEF) IVPB 2g/100 mL premix        2 g 200 mL/hr over 30 Minutes Intravenous On call to O.R. 06/21/20 0626 06/21/20 1216     and started on DVT prophylaxis in the form of Aspirin and TED hose.   PT and OT were ordered for total joint protocol. Discharge  planning consulted to help with postop disposition and equipment needs.  Patient had a good night on the evening of surgery, however he did experience some urinary retention resulting in multiple in/out catheters and ultimately leading to foley catheter placement. Patient followed by urology, and we were able to schedule an appointment for 3/31 with his Urologist to be evaluated and to have the catheter removed following discharge. Patient started to get up OOB with therapy on 06/21/20. Pt was seen during rounds and was ready to go home with foley in place, pending progress with therapy. He worked with therapy on POD #1 and was meeting goals. Pt was discharged to home later that day in stable condition.  Diet: Regular diet Activity: WBAT Follow-up: in two weeks Disposition: Home Discharged Condition: good   Discharge Instructions    Call MD / Call 911   Complete by: As directed    If you experience chest pain or shortness of breath, CALL 911 and be transported to the hospital emergency room.  If you develope a fever above 101 F, pus (Rumsey drainage) or increased drainage or redness at the wound, or calf pain, call your surgeon's office.   Call MD / Call 911   Complete by: As directed    If you experience chest pain or shortness of breath, CALL 911 and be transported to the hospital emergency room.  If you develope a fever above 101 F, pus ([place]arlatt drainage) or increased drainage or redness at the wound, or calf pain, call your surgeon's office.   Change dressing   Complete by: As directed    You have an adhesive waterproof bandage over the incision. Leave this in place until your first follow-up appointment. Once you remove this you will not need to place another bandage.   Change dressing   Complete by: As directed    You have an adhesive waterproof bandage over the incision. Leave this in place until your first follow-up appointment. Once you remove this you will not need to place another  bandage.   Constipation Prevention   Complete by: As directed    Drink plenty of fluids.  Prune juice may be helpful.  You may use a stool softener, such as Colace (over the counter) 100 mg twice a day.  Use MiraLax (over the counter) for constipation as needed.   Constipation Prevention   Complete by: As directed    Drink plenty of fluids.  Prune juice may be helpful.  You may use a stool softener, such as Colace (over the counter) 100 mg twice a day.  Use MiraLax (over the counter) for constipation as needed.   Diet - low sodium heart healthy   Complete by: As directed    Diet - low sodium heart healthy   Complete by: As directed    Do not sit on low chairs, stoools or toilet seats, as it may be difficult to get up from low surfaces   Complete by: As directed    Do not sit on low chairs, stoools or toilet seats, as it may be difficult to get up from low surfaces   Complete by: As directed    Driving restrictions   Complete by: As directed    No driving for two weeks   Driving restrictions   Complete by: As directed    No driving for two weeks   TED hose   Complete by: As directed    Use stockings (TED hose) for three weeks on both  leg(s).  You may remove them at night for sleeping.   TED hose   Complete by: As directed    Use stockings (TED hose) for three weeks on both leg(s).  You may remove them at night for sleeping.   Weight bearing as tolerated   Complete by: As directed    Weight bearing as tolerated   Complete by: As directed      Allergies as of 06/22/2020   No Known Allergies     Medication List    STOP taking these medications   meloxicam 15 MG tablet Commonly known as: MOBIC     TAKE these medications   aspirin 325 MG tablet Take 1 tablet (325 mg total) by mouth 2 (two) times daily. Then take one 81 mg aspirin once a day for three weeks. Then discontinue aspirin.   BEE POLLEN PO Take 1-2 capsules by mouth See admin instructions. Take 2 capsule by mouth in  the morning and 1 capsule in the evening   clopidogrel 75 MG tablet Commonly known as: PLAVIX TAKE 1 TABLET(75 MG) BY MOUTH DAILY What changed: See the new instructions.   CoQ10 100 MG Caps Take 100 mg by mouth daily.   ezetimibe-simvastatin 10-80 MG tablet Commonly known as: VYTORIN TAKE 1/2 TABLET BY MOUTH DAILY   finasteride 5 MG tablet Commonly known as: PROSCAR TAKE 1 TABLET(5 MG) BY MOUTH DAILY What changed: See the new instructions.   Fish Oil 1000 MG Caps Take 1,000 mg by mouth every evening.   HYDROcodone-acetaminophen 5-325 MG tablet Commonly known as: NORCO/VICODIN Take 1-2 tablets by mouth every 6 (six) hours as needed for severe pain.   levothyroxine 50 MCG tablet Commonly known as: SYNTHROID TAKE 1 TABLET BY MOUTH DAILY What changed: when to take this   methocarbamol 500 MG tablet Commonly known as: ROBAXIN Take 1 tablet (500 mg total) by mouth every 6 (six) hours as needed for muscle spasms.   metoprolol tartrate 25 MG tablet Commonly known as: LOPRESSOR TAKE 1 TABLET(25 MG) BY MOUTH TWICE DAILY What changed: See the new instructions.   MOVE FREE PO Take 1 tablet by mouth daily.   MULTIVITAMIN PO Take 1 tablet by mouth daily.   nitroGLYCERIN 0.4 MG SL tablet Commonly known as: NITROSTAT Place 1 tablet (0.4 mg total) under the tongue every 5 (five) minutes as needed for chest pain.   OVER THE COUNTER MEDICATION Take 3 capsules by mouth every morning. Prostagenix   tamsulosin 0.4 MG Caps capsule Commonly known as: FLOMAX TAKE 1 CAPSULE(0.4 MG) BY MOUTH DAILY What changed: See the new instructions.   traMADol 50 MG tablet Commonly known as: ULTRAM Take 1-2 tablets (50-100 mg total) by mouth every 6 (six) hours as needed for moderate pain.   TURMERIC CURCUMIN PO Take 1,000 mg by mouth daily.   UNABLE TO FIND Med Name: Move Free supp OTC - one a day   valACYclovir 500 MG tablet Commonly known as: VALTREX Take 500 mg by mouth daily as  needed (outbreaks).   Vitamin D 50 MCG (2000 UT) Caps Take 2,000 Units by mouth daily.   Zinc 50 MG Caps Take 50 mg by mouth daily.            Discharge Care Instructions  (From admission, onward)         Start     Ordered   06/22/20 0000  Weight bearing as tolerated        06/22/20 0838   06/22/20 0000  Change dressing       Comments: You have an adhesive waterproof bandage over the incision. Leave this in place until your first follow-up appointment. Once you remove this you will not need to place another bandage.   06/22/20 0838   06/22/20 0000  Weight bearing as tolerated        06/22/20 1735   06/22/20 0000  Change dressing       Comments: You have an adhesive waterproof bandage over the incision. Leave this in place until your first follow-up appointment. Once you remove this you will not need to place another bandage.   06/22/20 1735          Follow-up Information    Gaynelle Arabian, MD In 2 weeks.   Specialty: Orthopedic Surgery Contact information: [address] [place] [address] [phone]        Franchot Gallo, MD Follow up in 1 week(s).   Specialty: Urology Contact information: [place] [address] [phone]               Signed: Fenton Foy, MBA, PA-C Orthopedic Surgery 06/28/2020, 7:36 AM

## 2020-06-29 DIAGNOSIS — R338 Other retention of urine: Secondary | ICD-10-CM | POA: Diagnosis not present

## 2020-07-11 ENCOUNTER — Telehealth: Payer: Self-pay

## 2020-07-12 NOTE — Telephone Encounter (Signed)
Patient declined hospital follow up at this time but scheduled patient for chronic follow up

## 2020-07-24 ENCOUNTER — Ambulatory Visit: Payer: Self-pay | Admitting: Family Medicine

## 2020-07-25 DIAGNOSIS — Z4789 Encounter for other orthopedic aftercare: Secondary | ICD-10-CM | POA: Diagnosis not present

## 2020-07-27 ENCOUNTER — Ambulatory Visit: Payer: Medicare Other | Admitting: Family Medicine

## 2020-07-27 DIAGNOSIS — M25551 Pain in right hip: Secondary | ICD-10-CM | POA: Diagnosis not present

## 2020-07-27 DIAGNOSIS — M25651 Stiffness of right hip, not elsewhere classified: Secondary | ICD-10-CM | POA: Diagnosis not present

## 2020-07-31 DIAGNOSIS — M25651 Stiffness of right hip, not elsewhere classified: Secondary | ICD-10-CM | POA: Diagnosis not present

## 2020-07-31 DIAGNOSIS — M25551 Pain in right hip: Secondary | ICD-10-CM | POA: Diagnosis not present

## 2020-08-03 DIAGNOSIS — M25651 Stiffness of right hip, not elsewhere classified: Secondary | ICD-10-CM | POA: Diagnosis not present

## 2020-08-03 DIAGNOSIS — M25551 Pain in right hip: Secondary | ICD-10-CM | POA: Diagnosis not present

## 2020-08-06 ENCOUNTER — Other Ambulatory Visit: Payer: Self-pay | Admitting: Family Medicine

## 2020-08-06 DIAGNOSIS — I10 Essential (primary) hypertension: Secondary | ICD-10-CM

## 2020-08-06 DIAGNOSIS — E039 Hypothyroidism, unspecified: Secondary | ICD-10-CM

## 2020-08-06 DIAGNOSIS — M1611 Unilateral primary osteoarthritis, right hip: Secondary | ICD-10-CM

## 2020-08-06 DIAGNOSIS — E7800 Pure hypercholesterolemia, unspecified: Secondary | ICD-10-CM

## 2020-08-06 DIAGNOSIS — N401 Enlarged prostate with lower urinary tract symptoms: Secondary | ICD-10-CM

## 2020-08-06 DIAGNOSIS — R351 Nocturia: Secondary | ICD-10-CM

## 2020-08-07 DIAGNOSIS — M25651 Stiffness of right hip, not elsewhere classified: Secondary | ICD-10-CM | POA: Diagnosis not present

## 2020-08-07 DIAGNOSIS — M25551 Pain in right hip: Secondary | ICD-10-CM | POA: Diagnosis not present

## 2020-08-09 DIAGNOSIS — M25551 Pain in right hip: Secondary | ICD-10-CM | POA: Diagnosis not present

## 2020-08-09 DIAGNOSIS — M25651 Stiffness of right hip, not elsewhere classified: Secondary | ICD-10-CM | POA: Diagnosis not present

## 2020-08-15 DIAGNOSIS — M25651 Stiffness of right hip, not elsewhere classified: Secondary | ICD-10-CM | POA: Diagnosis not present

## 2020-08-15 DIAGNOSIS — M25551 Pain in right hip: Secondary | ICD-10-CM | POA: Diagnosis not present

## 2020-08-18 DIAGNOSIS — M25551 Pain in right hip: Secondary | ICD-10-CM | POA: Diagnosis not present

## 2020-08-18 DIAGNOSIS — M25651 Stiffness of right hip, not elsewhere classified: Secondary | ICD-10-CM | POA: Diagnosis not present

## 2020-08-24 ENCOUNTER — Ambulatory Visit: Payer: Medicare Other | Admitting: Family Medicine

## 2020-09-04 DIAGNOSIS — N401 Enlarged prostate with lower urinary tract symptoms: Secondary | ICD-10-CM | POA: Diagnosis not present

## 2020-09-04 DIAGNOSIS — M545 Low back pain, unspecified: Secondary | ICD-10-CM | POA: Diagnosis not present

## 2020-09-04 DIAGNOSIS — R351 Nocturia: Secondary | ICD-10-CM | POA: Diagnosis not present

## 2020-09-04 DIAGNOSIS — R338 Other retention of urine: Secondary | ICD-10-CM | POA: Diagnosis not present

## 2020-09-07 DIAGNOSIS — M545 Low back pain, unspecified: Secondary | ICD-10-CM | POA: Diagnosis not present

## 2020-09-12 ENCOUNTER — Telehealth: Payer: Self-pay | Admitting: Internal Medicine

## 2020-09-12 ENCOUNTER — Other Ambulatory Visit: Payer: Self-pay | Admitting: Urology

## 2020-09-12 DIAGNOSIS — M545 Low back pain, unspecified: Secondary | ICD-10-CM | POA: Diagnosis not present

## 2020-09-12 NOTE — Telephone Encounter (Signed)
   [facility] Pre-operative Risk Assessment    Patient Name: Corey Duran  DOB: 11/13/1950  MRN: [id number]   [employer]: - Please ensure there is not already an duplicate clearance open for this procedure. - Under Visit Info/Reason for Call, type in Other and utilize the format Clearance MM/DD/YY or Clearance TBD. Do not use dashes or single digits. - If request is for dental extraction, please clarify the # of teeth to be extracted. - If the patient is currently at the dentist's office, call Pre-Op APP to address. If the patient is not currently in the dentist office, please route to the Pre-Op pool  Request for surgical clearance:  What type of surgery is being performed? TURP   When is this surgery scheduled? 10/23/20   What type of clearance is required (medical clearance vs. Pharmacy clearance to hold med vs. Both)? Medical & Pharmacy  Are there any medications that need to be held prior to surgery and how long?Plavix one week prior to surgery   Practice name and name of physician performing surgery? [facility], [place]   What is the office phone number? [phone] ext. [phone]   7.   What is the office fax number? [phone]  8.   Anesthesia type (None, local, MAC, general) ? General    Corey Duran 09/12/2020, 12:37 PM  _________________________________________________________________   (provider comments below)

## 2020-09-12 NOTE — Telephone Encounter (Signed)
    [name] DOB:  02/21/1951  MRN:  [id number]   Primary Cardiologist: Larae Grooms, MD  Chart reviewed as part of pre-operative protocol coverage. Given past medical history and time since last visit, based on ACC/AHA guidelines, Corey Duran would be at acceptable risk for the planned procedure without further cardiovascular testing.   Pt was recently seen and cleared for orthopedic surgery with no further cardiac testing per Dr. Irish Lack. It will be acceptable for him to hold Plavix 5 days prior to this procedure and resume when ok from a surgical standpoint per procedural team.   The patient was advised that if he develops new symptoms prior to surgery to contact our office to arrange for a follow-up visit, and he verbalized understanding.  I will route this recommendation to the requesting party via Epic fax function and remove from pre-op pool.  Please call with questions.  Kathyrn Drown, NP 09/12/2020, 1:26 PM

## 2020-09-15 DIAGNOSIS — M545 Low back pain, unspecified: Secondary | ICD-10-CM | POA: Diagnosis not present

## 2020-09-21 ENCOUNTER — Ambulatory Visit (INDEPENDENT_AMBULATORY_CARE_PROVIDER_SITE_OTHER): Payer: Medicare Other | Admitting: Family Medicine

## 2020-09-21 ENCOUNTER — Encounter: Payer: Self-pay | Admitting: Family Medicine

## 2020-09-21 ENCOUNTER — Other Ambulatory Visit: Payer: Self-pay

## 2020-09-21 VITALS — BP 145/77 | HR 54 | Ht 65.0 in | Wt 174.0 lb

## 2020-09-21 DIAGNOSIS — N401 Enlarged prostate with lower urinary tract symptoms: Secondary | ICD-10-CM

## 2020-09-21 DIAGNOSIS — M1611 Unilateral primary osteoarthritis, right hip: Secondary | ICD-10-CM | POA: Diagnosis not present

## 2020-09-21 DIAGNOSIS — R351 Nocturia: Secondary | ICD-10-CM | POA: Diagnosis not present

## 2020-09-21 DIAGNOSIS — E7800 Pure hypercholesterolemia, unspecified: Secondary | ICD-10-CM | POA: Diagnosis not present

## 2020-09-21 DIAGNOSIS — E039 Hypothyroidism, unspecified: Secondary | ICD-10-CM | POA: Diagnosis not present

## 2020-09-21 DIAGNOSIS — I10 Essential (primary) hypertension: Secondary | ICD-10-CM

## 2020-09-21 DIAGNOSIS — Z23 Encounter for immunization: Secondary | ICD-10-CM

## 2020-09-21 MED ORDER — EZETIMIBE-SIMVASTATIN 10-80 MG PO TABS: 0.5000 | ORAL_TABLET | Freq: Every day | ORAL | 3 refills | Status: DC

## 2020-09-21 MED ORDER — CLOPIDOGREL BISULFATE 75 MG PO TABS: ORAL_TABLET | ORAL | 3 refills | Status: DC

## 2020-09-21 MED ORDER — MELOXICAM 15 MG PO TABS: ORAL_TABLET | ORAL | 3 refills | Status: DC

## 2020-09-21 MED ORDER — METOPROLOL TARTRATE 25 MG PO TABS: ORAL_TABLET | ORAL | 3 refills | Status: DC

## 2020-09-21 MED ORDER — FINASTERIDE 5 MG PO TABS: ORAL_TABLET | ORAL | 3 refills | Status: DC

## 2020-09-21 MED ORDER — LEVOTHYROXINE SODIUM 50 MCG PO TABS: 50.0000 ug | ORAL_TABLET | Freq: Every day | ORAL | 3 refills | Status: DC

## 2020-09-21 MED ORDER — TAMSULOSIN HCL 0.4 MG PO CAPS: 0.4000 mg | ORAL_CAPSULE | Freq: Every day | ORAL | 3 refills | Status: DC

## 2020-09-21 NOTE — Addendum Note (Signed)
Addended by: Alphonzo Dublin on: 09/21/2020 09:35 AM   Modules accepted: Orders

## 2020-09-21 NOTE — Progress Notes (Signed)
BP (!) 145/77   Pulse (!) 54   Ht _0  (1.651 m)   Wt 174 lb (78.9 kg)   SpO2 96%   BMI 28.96 kg/m    Subjective:   Patient ID: Corey Duran, male    DOB: 05-15-1950, 70 y.o.   MRN: [id number]  HPI: Corey Duran is a 70 y.o. male presenting on 09/21/2020 for Medical Management of Chronic Issues, Hypothyroidism, and Hypertension   HPI Hypertension Patient is currently on metoprolol, and their blood pressure today is 145/77. Patient denies any lightheadedness or dizziness. Patient denies headaches, blurred vision, chest pains, shortness of breath, or weakness. Denies any side effects from medication and is content with current medication.   Hyperlipidemia Patient is coming in for recheck of his hyperlipidemia. The patient is currently taking Vytorin. They deny any issues with myalgias or history of liver damage from it. They deny any focal numbness or weakness or chest pain.   Hypothyroidism recheck Patient is coming in for thyroid recheck today as well. They deny any issues with hair changes or heat or cold problems or diarrhea or constipation. They deny any chest pain or palpitations. They are currently on levothyroxine 50 micrograms   Patient has back pain and hip pain and he is seeing physical therapy for this but just wanted to make mention of it.  BPH Patient is coming in for recheck on BPH patient is seeing Dr. Diona Fanti and talked about TURP for this. Symptoms: Nocturia Medication: Finasteride and tamsulosin Last PSA:    Relevant past medical, surgical, family and social history reviewed and updated as indicated. Interim medical history since our last visit reviewed. Allergies and medications reviewed and updated.  Review of Systems  Constitutional:  Negative for chills and fever.  Eyes:  Negative for visual disturbance.  Respiratory:  Negative for shortness of breath and wheezing.   Cardiovascular:  Negative for chest pain and leg swelling.  Musculoskeletal:   Positive for back pain. Negative for arthralgias and gait problem.  Skin:  Negative for rash.  Neurological:  Negative for dizziness, weakness and light-headedness.  All other systems reviewed and are negative.  Per HPI unless specifically indicated above   Allergies as of 09/21/2020   No Known Allergies      Medication List        Accurate as of September 21, 2020  8:34 AM. If you have any questions, ask your nurse or doctor.          STOP taking these medications    aspirin 325 MG tablet Stopped by: Worthy Rancher, MD   HYDROcodone-acetaminophen 5-325 MG tablet Commonly known as: NORCO/VICODIN Stopped by: Worthy Rancher, MD   methocarbamol 500 MG tablet Commonly known as: ROBAXIN Stopped by: Worthy Rancher, MD   traMADol 50 MG tablet Commonly known as: Market researcher Stopped by: Fransisca Kaufmann Macdonald Rigor, MD       TAKE these medications    BEE POLLEN PO Take 1-2 capsules by mouth See admin instructions. Take 2 capsule by mouth in the morning and 1 capsule in the evening   clopidogrel 75 MG tablet Commonly known as: PLAVIX TAKE 1 TABLET(75 MG) BY MOUTH DAILY   CoQ10 100 MG Caps Take 100 mg by mouth daily.   ezetimibe-simvastatin 10-80 MG tablet Commonly known as: VYTORIN TAKE 1/2 TABLET BY MOUTH DAILY   finasteride 5 MG tablet Commonly known as: PROSCAR TAKE 1 TABLET(5 MG) BY MOUTH DAILY   Fish Oil 1000 MG Caps  Take 1,000 mg by mouth every evening.   levothyroxine 50 MCG tablet Commonly known as: SYNTHROID TAKE 1 TABLET BY MOUTH DAILY   meloxicam 15 MG tablet Commonly known as: MOBIC TAKE 1 TABLET(15 MG) BY MOUTH DAILY   metoprolol tartrate 25 MG tablet Commonly known as: LOPRESSOR TAKE 1 TABLET(25 MG) BY MOUTH TWICE DAILY   MOVE FREE PO Take 1 tablet by mouth daily.   MULTIVITAMIN PO Take 1 tablet by mouth daily.   nitroGLYCERIN 0.4 MG SL tablet Commonly known as: NITROSTAT Place 1 tablet (0.4 mg total) under the tongue every 5 (five)  minutes as needed for chest pain.   OVER THE COUNTER MEDICATION Take 3 capsules by mouth every morning. Prostagenix   tamsulosin 0.4 MG Caps capsule Commonly known as: FLOMAX TAKE 1 CAPSULE(0.4 MG) BY MOUTH DAILY   TURMERIC CURCUMIN PO Take 1,000 mg by mouth daily.   UNABLE TO FIND Med Name: Move Free supp OTC - one a day   valACYclovir 500 MG tablet Commonly known as: VALTREX Take 500 mg by mouth daily as needed (outbreaks).   Vitamin D 50 MCG (2000 UT) Caps Take 2,000 Units by mouth daily.   Zinc 50 MG Caps Take 50 mg by mouth daily.         Objective:   BP (!) 145/77   Pulse (!) 54   Ht _0  (1.651 m)   Wt 174 lb (78.9 kg)   SpO2 96%   BMI 28.96 kg/m   Wt Readings from Last 3 Encounters:  09/21/20 174 lb (78.9 kg)  06/26/20 177 lb 0.5 oz (80.3 kg)  06/21/20 177 lb (80.3 kg)    Physical Exam Vitals and nursing note reviewed.  Constitutional:      General: He is not in acute distress.    Appearance: He is well-developed. He is not diaphoretic.  Eyes:     General: No scleral icterus.    Conjunctiva/sclera: Conjunctivae normal.  Neck:     Thyroid: No thyromegaly.  Cardiovascular:     Rate and Rhythm: Normal rate and regular rhythm.     Heart sounds: Normal heart sounds. No murmur heard. Pulmonary:     Effort: Pulmonary effort is normal. No respiratory distress.     Breath sounds: Normal breath sounds. No wheezing.  Musculoskeletal:        General: No tenderness (No tenderness on exam). Normal range of motion.     Cervical back: Neck supple.  Lymphadenopathy:     Cervical: No cervical adenopathy.  Skin:    General: Skin is warm and dry.     Findings: No rash.  Neurological:     Mental Status: He is alert and oriented to person, place, and time.     Coordination: Coordination normal.  Psychiatric:        Behavior: Behavior normal.      Assessment & Plan:   Problem List Items Addressed This Visit       Cardiovascular and Mediastinum    Essential hypertension, benign   Relevant Medications   clopidogrel (PLAVIX) 75 MG tablet   ezetimibe-simvastatin (VYTORIN) 10-80 MG tablet   metoprolol tartrate (LOPRESSOR) 25 MG tablet   Other Relevant Orders   CBC with Differential/Platelet   CMP14+EGFR     Endocrine   Hypothyroidism   Relevant Medications   levothyroxine (SYNTHROID) 50 MCG tablet   metoprolol tartrate (LOPRESSOR) 25 MG tablet   Other Relevant Orders   TSH   CBC with Differential/Platelet  Musculoskeletal and Integument   Osteoarthritis of right hip   Relevant Medications   meloxicam (MOBIC) 15 MG tablet     Other   Hypercholesteremia   Relevant Medications   ezetimibe-simvastatin (VYTORIN) 10-80 MG tablet   metoprolol tartrate (LOPRESSOR) 25 MG tablet   Other Relevant Orders   Lipid panel   Lipid panel   Other Visit Diagnoses     Benign prostatic hyperplasia with nocturia       Relevant Medications   finasteride (PROSCAR) 5 MG tablet   tamsulosin (FLOMAX) 0.4 MG CAPS capsule   Other Relevant Orders   PSA, total and free   PSA, total and free       Continue current medication.  It sounds like he is going towards a TURP and recommend that he go ahead and do that.  He is working with Dr. Diona Fanti for his prostate.  Blood pressure slightly elevated recommend he check it over the next couple weeks and then give [place] some numbers. Follow up plan: Return in about 6 months (around 03/23/2021), or if symptoms worsen or fail to improve, for Thyroid and hypertension recheck.  Counseling provided for all of the vaccine components No orders of the defined types were placed in this encounter.   Caryl Pina, MD [facility] 09/21/2020, 8:34 AM

## 2020-09-22 LAB — CMP14+EGFR
ALT: 31 IU/L (ref 0–44)
AST: 25 IU/L (ref 0–40)
Albumin/Globulin Ratio: 2.3 — ABNORMAL HIGH (ref 1.2–2.2)
Albumin: 4.3 g/dL (ref 3.7–4.7)
Alkaline Phosphatase: 71 IU/L (ref 44–121)
BUN/Creatinine Ratio: 23 (ref 10–24)
BUN: 24 mg/dL (ref 8–27)
Bilirubin Total: 0.3 mg/dL (ref 0.0–1.2)
CO2: 23 mmol/L (ref 20–29)
Calcium: 9.2 mg/dL (ref 8.6–10.2)
Chloride: 102 mmol/L (ref 96–106)
Creatinine, Ser: 1.04 mg/dL (ref 0.76–1.27)
Globulin, Total: 1.9 g/dL (ref 1.5–4.5)
Glucose: 98 mg/dL (ref 65–99)
Potassium: 4.4 mmol/L (ref 3.5–5.2)
Sodium: 139 mmol/L (ref 134–144)
Total Protein: 6.2 g/dL (ref 6.0–8.5)
eGFR: 76 mL/min/{1.73_m2} (ref >59)

## 2020-09-22 LAB — PSA, TOTAL AND FREE
PSA, Free Pct: 24.8 %
PSA, Free: 0.57 ng/mL
Prostate Specific Ag, Serum: 2.3 ng/mL (ref 0.0–4.0)

## 2020-09-22 LAB — CBC WITH DIFFERENTIAL/PLATELET
Basophils Absolute: 0 10*3/uL (ref 0.0–0.2)
Basos: 1 %
EOS (ABSOLUTE): 0.1 10*3/uL (ref 0.0–0.4)
Eos: 2 %
Hematocrit: 45.7 % (ref 37.5–51.0)
Hemoglobin: 15.6 g/dL (ref 13.0–17.7)
Immature Grans (Abs): 0 10*3/uL (ref 0.0–0.1)
Immature Granulocytes: 0 %
Lymphocytes Absolute: 1.4 10*3/uL (ref 0.7–3.1)
Lymphs: 25 %
MCH: 31.7 pg (ref 26.6–33.0)
MCHC: 34.1 g/dL (ref 31.5–35.7)
MCV: 93 fL (ref 79–97)
Monocytes Absolute: 0.4 10*3/uL (ref 0.1–0.9)
Monocytes: 7 %
Neutrophils Absolute: 3.7 10*3/uL (ref 1.4–7.0)
Neutrophils: 65 %
Platelets: 178 10*3/uL (ref 150–450)
RBC: 4.92 x10E6/uL (ref 4.14–5.80)
RDW: 12.5 % (ref 11.6–15.4)
WBC: 5.6 10*3/uL (ref 3.4–10.8)

## 2020-09-22 LAB — LIPID PANEL
Chol/HDL Ratio: 5.4 ratio — ABNORMAL HIGH (ref 0.0–5.0)
Cholesterol, Total: 162 mg/dL (ref 100–199)
HDL: 30 mg/dL — ABNORMAL LOW (ref >39)
LDL Chol Calc (NIH): 63 mg/dL (ref 0–99)
Triglycerides: 448 mg/dL — ABNORMAL HIGH (ref 0–149)
VLDL Cholesterol Cal: 69 mg/dL — ABNORMAL HIGH (ref 5–40)

## 2020-09-22 LAB — TSH: TSH: 2.6 u[IU]/mL (ref 0.450–4.500)

## 2020-09-28 DIAGNOSIS — M545 Low back pain, unspecified: Secondary | ICD-10-CM | POA: Diagnosis not present

## 2020-10-20 ENCOUNTER — Other Ambulatory Visit (HOSPITAL_COMMUNITY): Payer: Medicare Other

## 2020-10-30 ENCOUNTER — Other Ambulatory Visit: Payer: Self-pay | Admitting: Family Medicine

## 2020-10-31 ENCOUNTER — Other Ambulatory Visit: Payer: Self-pay | Admitting: *Deleted

## 2020-10-31 MED ORDER — VALACYCLOVIR HCL 500 MG PO TABS: 500.0000 mg | ORAL_TABLET | Freq: Every day | ORAL | 1 refills | Status: DC | PRN

## 2020-10-31 MED ORDER — NITROGLYCERIN 0.4 MG SL SUBL: 0.4000 mg | SUBLINGUAL_TABLET | SUBLINGUAL | 3 refills | Status: DC | PRN

## 2020-11-28 DIAGNOSIS — H2513 Age-related nuclear cataract, bilateral: Secondary | ICD-10-CM | POA: Diagnosis not present

## 2020-11-28 DIAGNOSIS — H40013 Open angle with borderline findings, low risk, bilateral: Secondary | ICD-10-CM | POA: Diagnosis not present

## 2020-11-28 DIAGNOSIS — H524 Presbyopia: Secondary | ICD-10-CM | POA: Diagnosis not present

## 2021-02-09 ENCOUNTER — Encounter: Payer: Self-pay | Admitting: Family Medicine

## 2021-02-09 ENCOUNTER — Ambulatory Visit (INDEPENDENT_AMBULATORY_CARE_PROVIDER_SITE_OTHER): Payer: Medicare Other | Admitting: Family Medicine

## 2021-02-09 DIAGNOSIS — J0140 Acute pansinusitis, unspecified: Secondary | ICD-10-CM

## 2021-02-09 MED ORDER — DOXYCYCLINE HYCLATE 100 MG PO TABS: 100.0000 mg | ORAL_TABLET | Freq: Two times a day (BID) | ORAL | 0 refills | Status: AC

## 2021-02-09 MED ORDER — FLUTICASONE PROPIONATE 50 MCG/ACT NA SUSP: 2.0000 | Freq: Every day | NASAL | 6 refills | Status: DC

## 2021-02-09 NOTE — Progress Notes (Signed)
Virtual Visit via telephone Note Due to COVID-19 pandemic this visit was conducted virtually. This visit type was conducted due to national recommendations for restrictions regarding the COVID-19 Pandemic (e.g. social distancing, sheltering in place) in an effort to limit this patient's exposure and mitigate transmission in our community. All issues noted in this document were discussed and addressed.  A physical exam was not performed with this format.   I connected with Corey Duran on 02/09/2021 at 1205 by telephone and verified that I am speaking with the correct person using two identifiers. [name] is currently located at home and family is currently with them during visit. The provider, Monia Pouch, FNP is located in their office at time of visit.  I discussed the limitations, risks, security and privacy concerns of performing an evaluation and management service by telephone and the availability of in person appointments. I also discussed with the patient that there may be a patient responsible charge related to this service. The patient expressed understanding and agreed to proceed.  Subjective:  Patient ID: Corey Duran, male    DOB: [birth year], 70 y.o.   MRN: [id number]  Chief Complaint:  Sinus Problem   HPI: Corey Duran is a 70 y.o. male presenting on 02/09/2021 for Sinus Problem   Pt reports 2.5 weeks of frontal and maxillary sinus pressure. He has been taking OTC decongestants without relief of symptoms. He reports pressure in forehead is worse when he bends over and at night.   Sinus Problem This is a new problem. The current episode started 1 to 4 weeks ago. The problem has been gradually worsening since onset. The maximum temperature recorded prior to his arrival was 100.4 - 100.9 F. His pain is at a severity of 6/10. The pain is moderate. Associated symptoms include chills, congestion, coughing, headaches and sinus pressure. Pertinent negatives include no  diaphoresis, ear pain, hoarse voice, neck pain, shortness of breath, sneezing, sore throat or swollen glands. Past treatments include acetaminophen and oral decongestants. The treatment provided no relief.    Relevant past medical, surgical, family, and social history reviewed and updated as indicated.  Allergies and medications reviewed and updated.   Past Medical History:  Diagnosis Date   Arthritis    BPH (benign prostatic hypertrophy)    followed by Dr. Reece Agar in the past; Now Dr. Diona Fanti   Coronary atherosclerosis of native coronary artery    05/2006, stent LAD, patent 03/2007, study stent   Heart murmur    Hypercholesteremia    Insomnia    Laceration of leg    severe, 2009   LBP (low back pain)    OSA (obstructive sleep apnea)    PSG 07/23/10 ESS 11, AHI 16/hr REM 18/hr, RDI 17/hr REM 20/hr, O2 min 87%; CPAP 08/22/10 CPAP 10 with AHI 0   Pneumonia    at age 2    Tenosynovitis of finger    left third finger    Past Surgical History:  Procedure Laterality Date   CORONARY ANGIOPLASTY     stent- 05/2006    ROTATOR CUFF REPAIR Right 0918/2018   TOTAL HIP ARTHROPLASTY Right 06/21/2020   Procedure: TOTAL HIP ARTHROPLASTY ANTERIOR APPROACH;  Surgeon: Gaynelle Arabian, MD;  Location: [facility];  Service: Orthopedics;  Laterality: Right;  17min    Social History   Socioeconomic History   Marital status: Divorced    Spouse name: Not on file   Number of children: Not on file   Years of  education: Not on file   Highest education level: Not on file  Occupational History   Not on file  Tobacco Use   Smoking status: Former    Packs/day: 0.25    Years: 10.00    Pack years: 2.50    Types: Cigarettes    Quit date: 2000    Years since quitting: 22.8   Smokeless tobacco: Never  Vaping Use   Vaping Use: Never used  Substance and Sexual Activity   Alcohol use: Yes    Comment: occ    Drug use: No   Sexual activity: Not on file  Other Topics Concern   Not on file  Social  History Narrative   Not on file   Social Determinants of Health   Financial Resource Strain: Not on file  Food Insecurity: Not on file  Transportation Needs: Not on file  Physical Activity: Not on file  Stress: Not on file  Social Connections: Not on file  Intimate Partner Violence: Not on file    Outpatient Encounter Medications as of 02/09/2021  Medication Sig   doxycycline (VIBRA-TABS) 100 MG tablet Take 1 tablet (100 mg total) by mouth 2 (two) times daily for 10 days. 1 po bid   fluticasone (FLONASE) 50 MCG/ACT nasal spray Place 2 sprays into both nostrils daily.   BEE POLLEN PO Take 1-2 capsules by mouth See admin instructions. Take 2 capsule by mouth in the morning and 1 capsule in the evening   Cholecalciferol (VITAMIN D) 50 MCG (2000 UT) CAPS Take 2,000 Units by mouth daily.   clopidogrel (PLAVIX) 75 MG tablet Take one tablet daily   Coenzyme Q10 (COQ10) 100 MG CAPS Take 100 mg by mouth daily.   ezetimibe-simvastatin (VYTORIN) 10-80 MG tablet Take 0.5 tablets by mouth daily.   finasteride (PROSCAR) 5 MG tablet TAKE 1 TABLET(5 MG) BY MOUTH DAILY   Glucosamine-Chondroitin (MOVE FREE PO) Take 1 tablet by mouth daily.   levothyroxine (SYNTHROID) 50 MCG tablet Take 1 tablet (50 mcg total) by mouth daily.   meloxicam (MOBIC) 15 MG tablet TAKE 1 TABLET(15 MG) BY MOUTH DAILY   metoprolol tartrate (LOPRESSOR) 25 MG tablet Take one tablet twice daily   Multiple Vitamins-Minerals (MULTIVITAMIN PO) Take 1 tablet by mouth daily.   nitroGLYCERIN (NITROSTAT) 0.4 MG SL tablet Place 1 tablet (0.4 mg total) under the tongue every 5 (five) minutes as needed for chest pain.   Omega-3 Fatty Acids (FISH OIL) 1000 MG CAPS Take 1,000 mg by mouth every evening.   OVER THE COUNTER MEDICATION Take 3 capsules by mouth every morning. Prostagenix   tamsulosin (FLOMAX) 0.4 MG CAPS capsule Take 1 capsule (0.4 mg total) by mouth daily after supper.   TURMERIC CURCUMIN PO Take 1,000 mg by mouth daily.    UNABLE TO FIND Med Name: Move Free supp OTC - one a day   valACYclovir (VALTREX) 500 MG tablet Take 1 tablet (500 mg total) by mouth daily as needed (outbreaks).   Zinc 50 MG CAPS Take 50 mg by mouth daily.   No facility-administered encounter medications on file as of 02/09/2021.    No Known Allergies  Review of Systems  Constitutional:  Positive for chills, fatigue and fever. Negative for activity change, appetite change, diaphoresis and unexpected weight change.  HENT:  Positive for congestion, postnasal drip, rhinorrhea, sinus pressure and sinus pain. Negative for dental problem, drooling, ear discharge, ear pain, facial swelling, hearing loss, hoarse voice, mouth sores, nosebleeds, sneezing, sore throat, tinnitus, trouble swallowing  and voice change.   Eyes: Negative.   Respiratory:  Positive for cough. Negative for chest tightness and shortness of breath.   Cardiovascular:  Negative for chest pain, palpitations and leg swelling.  Gastrointestinal:  Negative for blood in stool, constipation, diarrhea, nausea and vomiting.  Endocrine: Negative.   Genitourinary:  Negative for decreased urine volume, dysuria, frequency and urgency.  Musculoskeletal:  Negative for arthralgias, myalgias and neck pain.  Skin: Negative.   Allergic/Immunologic: Negative.   Neurological:  Positive for headaches. Negative for dizziness, tremors, seizures, syncope, facial asymmetry, speech difficulty, weakness, light-headedness and numbness.  Hematological: Negative.   Psychiatric/Behavioral:  Negative for confusion, hallucinations, sleep disturbance and suicidal ideas.   All other systems reviewed and are negative.       Observations/Objective: No vital signs or physical exam, this was a telephone or virtual health encounter.  Pt alert and oriented, answers all questions appropriately, and able to speak in full sentences.    Assessment and Plan: Severino was seen today for sinus problem.  Diagnoses and  all orders for this visit:  Acute non-recurrent pansinusitis Symptoms consistent with acute sinusitis.  Symptoms have been present for more than 2 weeks and not relieved by symptomatic care at home.  Will initiate doxycycline and Flonase.  Patient aware to report any new, worsening, or persistent symptoms.  Adequate hydration discussed in detail. -     fluticasone (FLONASE) 50 MCG/ACT nasal spray; Place 2 sprays into both nostrils daily. -     doxycycline (VIBRA-TABS) 100 MG tablet; Take 1 tablet (100 mg total) by mouth 2 (two) times daily for 10 days. 1 po bid    Follow Up Instructions: Return if symptoms worsen or fail to improve.    I discussed the assessment and treatment plan with the patient. The patient was provided an opportunity to ask questions and all were answered. The patient agreed with the plan and demonstrated an understanding of the instructions.   The patient was advised to call back or seek an in-person evaluation if the symptoms worsen or if the condition fails to improve as anticipated.  The above assessment and management plan was discussed with the patient. The patient verbalized understanding of and has agreed to the management plan. Patient is aware to call the clinic if they develop any new symptoms or if symptoms persist or worsen. Patient is aware when to return to the clinic for a follow-up visit. Patient educated on when it is appropriate to go to the emergency department.    I provided 12 minutes of non-face-to-face time during this encounter. The call started at 1205. The call ended at 1217. The other time was used for coordination of care.    Monia Pouch, FNP-C [facility] [address] [place], [place] [address] [phone] 02/09/2021

## 2021-02-12 ENCOUNTER — Ambulatory Visit (HOSPITAL_BASED_OUTPATIENT_CLINIC_OR_DEPARTMENT_OTHER): Admit: 2021-02-12 | Payer: Medicare Other | Admitting: Urology

## 2021-02-12 ENCOUNTER — Encounter (HOSPITAL_BASED_OUTPATIENT_CLINIC_OR_DEPARTMENT_OTHER): Payer: Self-pay

## 2021-02-12 SURGERY — TURP (TRANSURETHRAL RESECTION OF PROSTATE)
Anesthesia: General

## 2021-02-27 DIAGNOSIS — H2513 Age-related nuclear cataract, bilateral: Secondary | ICD-10-CM | POA: Diagnosis not present

## 2021-02-27 DIAGNOSIS — H40013 Open angle with borderline findings, low risk, bilateral: Secondary | ICD-10-CM | POA: Diagnosis not present

## 2021-03-14 DIAGNOSIS — L570 Actinic keratosis: Secondary | ICD-10-CM | POA: Diagnosis not present

## 2021-03-14 DIAGNOSIS — L814 Other melanin hyperpigmentation: Secondary | ICD-10-CM | POA: Diagnosis not present

## 2021-03-14 DIAGNOSIS — Z85828 Personal history of other malignant neoplasm of skin: Secondary | ICD-10-CM | POA: Diagnosis not present

## 2021-03-14 DIAGNOSIS — L820 Inflamed seborrheic keratosis: Secondary | ICD-10-CM | POA: Diagnosis not present

## 2021-03-22 ENCOUNTER — Encounter: Payer: Self-pay | Admitting: Family Medicine

## 2021-03-22 ENCOUNTER — Ambulatory Visit (INDEPENDENT_AMBULATORY_CARE_PROVIDER_SITE_OTHER): Payer: Medicare Other | Admitting: Family Medicine

## 2021-03-22 VITALS — BP 142/80 | HR 53 | Ht 65.0 in | Wt 174.0 lb

## 2021-03-22 DIAGNOSIS — E039 Hypothyroidism, unspecified: Secondary | ICD-10-CM

## 2021-03-22 DIAGNOSIS — I10 Essential (primary) hypertension: Secondary | ICD-10-CM | POA: Diagnosis not present

## 2021-03-22 DIAGNOSIS — E7800 Pure hypercholesterolemia, unspecified: Secondary | ICD-10-CM

## 2021-03-22 DIAGNOSIS — I2510 Atherosclerotic heart disease of native coronary artery without angina pectoris: Secondary | ICD-10-CM

## 2021-03-22 DIAGNOSIS — Z23 Encounter for immunization: Secondary | ICD-10-CM | POA: Diagnosis not present

## 2021-03-22 NOTE — Progress Notes (Signed)
BP (!) 142/80    Pulse (!) 53    Ht 5' 5"  (1.651 m)    Wt 174 lb (78.9 kg)    SpO2 97%    BMI 28.96 kg/m    Subjective:   Patient ID: Corey Duran, male    DOB: 1950/08/26, 70 y.o.   MRN: [id number]  HPI: Corey Duran is a 70 y.o. male presenting on 03/22/2021 for Medical Management of Chronic Issues, Hyperlipidemia, Hypertension, and Hypothyroidism   HPI Hypertension Patient is currently on metoprolol, and their blood pressure today is 142/80. Patient denies any lightheadedness or dizziness. Patient denies headaches, blurred vision, chest pains, shortness of breath, or weakness. Denies any side effects from medication and is content with current medication.   Hypothyroidism recheck Patient is coming in for thyroid recheck today as well. They deny any issues with hair changes or heat or cold problems or diarrhea or constipation. They deny any chest pain or palpitations. They are currently on levothyroxine 50 micrograms   Hyperlipidemia and CAD Patient is coming in for recheck of his hyperlipidemia. The patient is currently taking Vytorin and fish oil. They deny any issues with myalgias or history of liver damage from it. They deny any focal numbness or weakness or chest pain.   Patient says is been having trouble with being more irritable recently.  He says not to the point where he is feeling depressed or anxious just more irritable than he would like to be.  We discussed some calming and peaceful relaxation exercises and he wants to try those before any medicine.  Patient does state that sometimes when he swallows things get stuck, mostly foods, is not all the time but just some time.  He denies any heartburn or acid reflux but does say he gets a lot of nasal congestion and sinus drainage, recommended antihistamine for couple months and let us know if not improved  Relevant past medical, surgical, family and social history reviewed and updated as indicated. Interim medical history  since our last visit reviewed. Allergies and medications reviewed and updated.  Review of Systems  Constitutional:  Negative for chills and fever.  Eyes:  Negative for visual disturbance.  Respiratory:  Negative for shortness of breath and wheezing.   Cardiovascular:  Negative for chest pain and leg swelling.  Musculoskeletal:  Negative for back pain and gait problem.  Skin:  Negative for rash.  Neurological:  Negative for dizziness, weakness and light-headedness.  All other systems reviewed and are negative.  Per HPI unless specifically indicated above   Allergies as of 03/22/2021   No Known Allergies      Medication List        Accurate as of March 22, 2021  9:53 AM. If you have any questions, ask your nurse or doctor.          BEE POLLEN PO Take 1-2 capsules by mouth See admin instructions. Take 2 capsule by mouth in the morning and 1 capsule in the evening   clopidogrel 75 MG tablet Commonly known as: PLAVIX Take one tablet daily   CoQ10 100 MG Caps Take 100 mg by mouth daily.   ezetimibe-simvastatin 10-80 MG tablet Commonly known as: VYTORIN Take 0.5 tablets by mouth daily.   finasteride 5 MG tablet Commonly known as: PROSCAR TAKE 1 TABLET(5 MG) BY MOUTH DAILY   Fish Oil 1000 MG Caps Take 1,000 mg by mouth every evening.   fluticasone 50 MCG/ACT nasal spray Commonly known as: FLONASE  Place 2 sprays into both nostrils daily.   levothyroxine 50 MCG tablet Commonly known as: SYNTHROID Take 1 tablet (50 mcg total) by mouth daily.   meloxicam 15 MG tablet Commonly known as: MOBIC TAKE 1 TABLET(15 MG) BY MOUTH DAILY   metoprolol tartrate 25 MG tablet Commonly known as: LOPRESSOR Take one tablet twice daily   MOVE FREE PO Take 1 tablet by mouth daily.   MULTIVITAMIN PO Take 1 tablet by mouth daily.   nitroGLYCERIN 0.4 MG SL tablet Commonly known as: NITROSTAT Place 1 tablet (0.4 mg total) under the tongue every 5 (five) minutes as needed  for chest pain.   OVER THE COUNTER MEDICATION Take 3 capsules by mouth every morning. Prostagenix   tamsulosin 0.4 MG Caps capsule Commonly known as: FLOMAX Take 1 capsule (0.4 mg total) by mouth daily after supper.   TURMERIC CURCUMIN PO Take 1,000 mg by mouth daily.   UNABLE TO FIND Med Name: Move Free supp OTC - one a day   valACYclovir 500 MG tablet Commonly known as: VALTREX Take 1 tablet (500 mg total) by mouth daily as needed (outbreaks).   Vitamin D 50 MCG (2000 UT) Caps Take 2,000 Units by mouth daily.   Zinc 50 MG Caps Take 50 mg by mouth daily.         Objective:   BP (!) 142/80    Pulse (!) 53    Ht 5' 5"  (1.651 m)    Wt 174 lb (78.9 kg)    SpO2 97%    BMI 28.96 kg/m   Wt Readings from Last 3 Encounters:  03/22/21 174 lb (78.9 kg)  09/21/20 174 lb (78.9 kg)  06/26/20 177 lb 0.5 oz (80.3 kg)    Physical Exam Vitals and nursing note reviewed.  Constitutional:      General: He is not in acute distress.    Appearance: He is well-developed. He is not diaphoretic.  Eyes:     General: No scleral icterus.    Conjunctiva/sclera: Conjunctivae normal.  Neck:     Thyroid: No thyromegaly.  Cardiovascular:     Rate and Rhythm: Normal rate and regular rhythm.     Heart sounds: Normal heart sounds. No murmur heard. Pulmonary:     Effort: Pulmonary effort is normal. No respiratory distress.     Breath sounds: Normal breath sounds. No wheezing.  Musculoskeletal:        General: Normal range of motion.     Cervical back: Neck supple.  Lymphadenopathy:     Cervical: No cervical adenopathy.  Skin:    General: Skin is warm and dry.     Findings: No rash.  Neurological:     Mental Status: He is alert and oriented to person, place, and time.     Coordination: Coordination normal.  Psychiatric:        Behavior: Behavior normal.      Assessment & Plan:   Problem List Items Addressed This Visit       Cardiovascular and Mediastinum   Coronary  atherosclerosis of native coronary artery   Essential hypertension, benign   Relevant Orders   CBC with Differential/Platelet   CMP14+EGFR     Endocrine   Hypothyroidism   Relevant Orders   TSH     Other   Hypercholesteremia - Primary   Relevant Orders   CMP14+EGFR   Lipid panel   Other Visit Diagnoses     Need for immunization against influenza  Relevant Orders   Flu Vaccine QUAD High Dose(Fluad) (Completed)     Continue current medicine, no changes, will check blood work.  BP mildly elevated, allowing permissive hypertension  Follow up plan: Return in about 6 months (around 09/20/2021), or if symptoms worsen or fail to improve, for Physical and thyroid and cholesterol.  Counseling provided for all of the vaccine components Orders Placed This Encounter  Procedures   Flu Vaccine QUAD High Dose(Fluad)   CBC with Differential/Platelet   CMP14+EGFR   Lipid panel   TSH    Caryl Pina, MD [facility] 03/22/2021, 9:53 AM

## 2021-03-23 LAB — CBC WITH DIFFERENTIAL/PLATELET
Basophils Absolute: 0 10*3/uL (ref 0.0–0.2)
Basos: 0 %
EOS (ABSOLUTE): 0.1 10*3/uL (ref 0.0–0.4)
Eos: 2 %
Hematocrit: 46.6 % (ref 37.5–51.0)
Hemoglobin: 15.7 g/dL (ref 13.0–17.7)
Immature Grans (Abs): 0 10*3/uL (ref 0.0–0.1)
Immature Granulocytes: 0 %
Lymphocytes Absolute: 1.6 10*3/uL (ref 0.7–3.1)
Lymphs: 30 %
MCH: 30.9 pg (ref 26.6–33.0)
MCHC: 33.7 g/dL (ref 31.5–35.7)
MCV: 92 fL (ref 79–97)
Monocytes Absolute: 0.3 10*3/uL (ref 0.1–0.9)
Monocytes: 6 %
Neutrophils Absolute: 3.4 10*3/uL (ref 1.4–7.0)
Neutrophils: 62 %
Platelets: 180 10*3/uL (ref 150–450)
RBC: 5.08 x10E6/uL (ref 4.14–5.80)
RDW: 12 % (ref 11.6–15.4)
WBC: 5.5 10*3/uL (ref 3.4–10.8)

## 2021-03-23 LAB — LIPID PANEL
Chol/HDL Ratio: 3.1 ratio (ref 0.0–5.0)
Cholesterol, Total: 142 mg/dL (ref 100–199)
HDL: 46 mg/dL (ref >39)
LDL Chol Calc (NIH): 80 mg/dL (ref 0–99)
Triglycerides: 84 mg/dL (ref 0–149)
VLDL Cholesterol Cal: 16 mg/dL (ref 5–40)

## 2021-03-23 LAB — CMP14+EGFR
ALT: 26 IU/L (ref 0–44)
AST: 24 IU/L (ref 0–40)
Albumin/Globulin Ratio: 1.9 (ref 1.2–2.2)
Albumin: 4.4 g/dL (ref 3.7–4.7)
Alkaline Phosphatase: 65 IU/L (ref 44–121)
BUN/Creatinine Ratio: 26 — ABNORMAL HIGH (ref 10–24)
BUN: 27 mg/dL (ref 8–27)
Bilirubin Total: 0.7 mg/dL (ref 0.0–1.2)
CO2: 24 mmol/L (ref 20–29)
Calcium: 9.3 mg/dL (ref 8.6–10.2)
Chloride: 102 mmol/L (ref 96–106)
Creatinine, Ser: 1.02 mg/dL (ref 0.76–1.27)
Globulin, Total: 2.3 g/dL (ref 1.5–4.5)
Glucose: 103 mg/dL — ABNORMAL HIGH (ref 70–99)
Potassium: 4.5 mmol/L (ref 3.5–5.2)
Sodium: 141 mmol/L (ref 134–144)
Total Protein: 6.7 g/dL (ref 6.0–8.5)
eGFR: 78 mL/min/{1.73_m2} (ref >59)

## 2021-03-23 LAB — TSH: TSH: 3.09 u[IU]/mL (ref 0.450–4.500)

## 2021-04-05 ENCOUNTER — Ambulatory Visit (INDEPENDENT_AMBULATORY_CARE_PROVIDER_SITE_OTHER): Payer: Medicare Other | Admitting: *Deleted

## 2021-04-05 DIAGNOSIS — Z23 Encounter for immunization: Secondary | ICD-10-CM

## 2021-04-05 NOTE — Progress Notes (Signed)
Shingrix given and patient tolerated well. Side effects discussed with patient.  

## 2021-05-07 DIAGNOSIS — N5201 Erectile dysfunction due to arterial insufficiency: Secondary | ICD-10-CM | POA: Diagnosis not present

## 2021-05-07 DIAGNOSIS — N401 Enlarged prostate with lower urinary tract symptoms: Secondary | ICD-10-CM | POA: Diagnosis not present

## 2021-05-07 DIAGNOSIS — R351 Nocturia: Secondary | ICD-10-CM | POA: Diagnosis not present

## 2021-07-30 NOTE — Progress Notes (Signed)
?  ?Cardiology Office Note ? ? ?Date:  07/31/2021  ? ?ID:  Corey Duran, DOB 06-Jun-1950, MRN [id number] ? ?PCP:  Dettinger, Fransisca Kaufmann, MD  ? ? ?No chief complaint on file. ? ?CAD ? ?Wt Readings from Last 3 Encounters:  ?07/31/21 176 lb (79.8 kg)  ?03/22/21 174 lb (78.9 kg)  ?09/21/20 174 lb (78.9 kg)  ?  ? ?  ?History of Present Illness: ?Corey Duran is a 71 y.o. male   who has had CAD.  He had an LAD stent ( 3.5 x 16 DES ( [facility] study)) in 2008.  He had been on CPAP in the past but stopped it due to the inconvenience.  He sleeps better with the CPAP, but it was not worth the hassle.  He plays golf, dances for exercise. ?  ?He has lost some weight in 2017 through diet control.  He lost more in 2017.  ?  ?Stress test in Jan 2020 showed: ?Blood pressure demonstrated a normal response to exercise. ?There was no ST segment deviation noted during stress. ?  ?ETT with good exercise tolerance (9:00); no chest pain; normal BP response; no ST changes; negative adequate ETT; [facility] treadmill score 9. ?  ?He was considering TURP in 2021, but he was not a candidate due to size of prostate.  He continues to have interrupted sleep.  He has to get up 3-10x/night. ?  ?RIGHT TOTAL HIP ARTHOPLASTY - ANTERIOR in 05/03/20.  Did well with hip surgery.  ? ?He is more active since the hip, but not walking regularly. ?  ?Denies : Chest pain. Dizziness. Leg edema. Nitroglycerin use. Orthopnea. Palpitations. Paroxysmal nocturnal dyspnea. Shortness of breath. Syncope.   ? ?Does bike 12-14 miles/week. ? ? ?Past Medical History:  ?Diagnosis Date  ? Arthritis   ? BPH (benign prostatic hypertrophy)   ? followed by Dr. Reece Agar in the past; Now Dr. Diona Fanti  ? Coronary atherosclerosis of native coronary artery   ? 05/2006, stent LAD, patent 03/2007, study stent  ? Heart murmur   ? Hypercholesteremia   ? Insomnia   ? Laceration of leg   ? severe, 2009  ? LBP (low back pain)   ? OSA (obstructive sleep apnea)   ? PSG 07/23/10 ESS 11, AHI 16/hr  REM 18/hr, RDI 17/hr REM 20/hr, O2 min 87%; CPAP 08/22/10 CPAP 10 with AHI 0  ? Pneumonia   ? at age 15   ? Tenosynovitis of finger   ? left third finger  ? ? ?Past Surgical History:  ?Procedure Laterality Date  ? CORONARY ANGIOPLASTY    ? stent- 05/2006   ? ROTATOR CUFF REPAIR Right 0918/2018  ? TOTAL HIP ARTHROPLASTY Right 06/21/2020  ? Procedure: TOTAL HIP ARTHROPLASTY ANTERIOR APPROACH;  Surgeon: Gaynelle Arabian, MD;  Location: [facility];  Service: Orthopedics;  Laterality: Right;  136mn  ? ? ? ?Current Outpatient Medications  ?Medication Sig Dispense Refill  ? BEE POLLEN PO Take 1-2 capsules by mouth See admin instructions. Take 2 capsule by mouth in the morning and 1 capsule in the evening    ? Cholecalciferol (VITAMIN D) 50 MCG (2000 UT) CAPS Take 2,000 Units by mouth daily.    ? clopidogrel (PLAVIX) 75 MG tablet Take one tablet daily 90 tablet 3  ? Coenzyme Q10 (COQ10) 100 MG CAPS Take 100 mg by mouth daily.    ? ezetimibe-simvastatin (VYTORIN) 10-80 MG tablet Take 0.5 tablets by mouth daily. 45 tablet 3  ? finasteride (PROSCAR) 5 MG  tablet TAKE 1 TABLET(5 MG) BY MOUTH DAILY 90 tablet 3  ? Glucosamine-Chondroitin (MOVE FREE PO) Take 1 tablet by mouth daily.    ? levothyroxine (SYNTHROID) 50 MCG tablet Take 1 tablet (50 mcg total) by mouth daily. 90 tablet 3  ? meloxicam (MOBIC) 15 MG tablet TAKE 1 TABLET(15 MG) BY MOUTH DAILY 90 tablet 3  ? metoprolol tartrate (LOPRESSOR) 25 MG tablet Take one tablet twice daily 180 tablet 3  ? Multiple Vitamins-Minerals (MULTIVITAMIN PO) Take 1 tablet by mouth daily.    ? nitroGLYCERIN (NITROSTAT) 0.4 MG SL tablet Place 1 tablet (0.4 mg total) under the tongue every 5 (five) minutes as needed for chest pain. 25 tablet 3  ? Omega-3 Fatty Acids (FISH OIL) 1000 MG CAPS Take 1,000 mg by mouth every evening.    ? tamsulosin (FLOMAX) 0.4 MG CAPS capsule Take 1 capsule (0.4 mg total) by mouth daily after supper. 90 capsule 3  ? TURMERIC CURCUMIN PO Take 1,000 mg by mouth daily.    ?  UNABLE TO FIND Med Name: Move Free supp OTC - one a day    ? valACYclovir (VALTREX) 500 MG tablet Take 1 tablet (500 mg total) by mouth daily as needed (outbreaks). 30 tablet 1  ? Zinc 50 MG CAPS Take 50 mg by mouth daily.    ? fluticasone (FLONASE) 50 MCG/ACT nasal spray Place 2 sprays into both nostrils daily. (Patient not taking: Reported on 07/31/2021) 16 g 6  ? OVER THE COUNTER MEDICATION Take 3 capsules by mouth every morning. Prostagenix (Patient not taking: Reported on 07/31/2021)    ? ?No current facility-administered medications for this visit.  ? ? ?Allergies:   Patient has no known allergies.  ? ? ?Social History:  The patient  reports that he quit smoking about 23 years ago. His smoking use included cigarettes. He has a 2.50 pack-year smoking history. He has never used smokeless tobacco. He reports current alcohol use. He reports that he does not use drugs.  ? ?Family History:  The patient's family history includes CAD in his father; Dementia in his mother; Heart attack in his father; Hypertension in his father; Ulcers in his father.  ? ? ?ROS:  Please see the history of present illness.   Otherwise, review of systems are positive for hip pain reduced.   All other systems are reviewed and negative.  ? ? ?PHYSICAL EXAM: ?VS:  BP 108/72   Pulse (!) 53   Ht 5' 5.5" (1.664 m)   Wt 176 lb (79.8 kg)   SpO2 96%   BMI 28.84 kg/m?  , BMI Body mass index is 28.84 kg/m?. ?GEN: Well nourished, well developed, in no acute distress ?HEENT: normal ?Neck: no JVD, carotid bruits, or masses ?Cardiac: RRR; no murmurs, rubs, or gallops,no edema  ?Respiratory:  clear to auscultation bilaterally, normal work of breathing ?GI: soft, nontender, nondistended, + BS ?MS: no deformity or atrophy ?Skin: warm and dry, no rash ?Neuro:  Strength and sensation are intact ?Psych: euthymic mood, full affect ? ? ?EKG:   ?The ekg ordered today demonstrates NSR, no ST changes ? ? ?Recent Labs: ?03/22/2021: ALT 26; BUN 27; Creatinine, Ser  1.02; Hemoglobin 15.7; Platelets 180; Potassium 4.5; Sodium 141; TSH 3.090  ? ?Lipid Panel ?   ?Component Value Date/Time  ? CHOL 142 03/22/2021 0954  ? TRIG 84 03/22/2021 0954  ? HDL 46 03/22/2021 0954  ? CHOLHDL 3.1 03/22/2021 0954  ? CHOLHDL 4.3 01/24/2015 1147  ? VLDL 32 (  H) 01/24/2015 1147  ? [place] 80 03/22/2021 0954  ? LDLDIRECT 70.4 03/29/2014 1141  ? ?  ?Other studies Reviewed: ?Additional studies/ records that were reviewed today with results demonstrating: labs reviewed. ? ? ?ASSESSMENT AND PLAN: ? ?CAD: No angina.  Continue aggressive secondary prevention. Plavix monotherapy. No internal bleeding.  ?Hyperlipidemia: LDL 80. The current medical regimen is effective;  continue present plan and medications. ?HTN: Was a little high so he sarted checking at home.  Highest 384 mm Hg systolic.  Avoiding processed foods.  Eating low-salt diet. Increase exercise.  [id number] on my recheck with smaller cuff.  Continue metoprolol.   ?OSA: Not using CPAP.  No issues at surgery. He gets up to urinate a lot. ? ? ?Current medicines are reviewed at length with the patient today.  The patient concerns regarding his medicines were addressed. ? ?The following changes have been made:  No change ? ?Labs/ tests ordered today include:  ?No orders of the defined types were placed in this encounter. ? ? ?Recommend 150 minutes/week of aerobic exercise ?Low fat, low carb, high fiber diet recommended ? ?Disposition:   FU in 1 year ? ? ?Signed, ?Larae Grooms, MD  ?07/31/2021 1:55 PM    ?[facility] ?[address], [place], [place]  [address] ?Phone: [phone]; Fax: [phone]  ? ?

## 2021-07-31 ENCOUNTER — Encounter: Payer: Self-pay | Admitting: Interventional Cardiology

## 2021-07-31 ENCOUNTER — Ambulatory Visit: Payer: Medicare Other | Admitting: Interventional Cardiology

## 2021-07-31 VITALS — BP 108/72 | HR 53 | Ht 65.5 in | Wt 176.0 lb

## 2021-07-31 DIAGNOSIS — I10 Essential (primary) hypertension: Secondary | ICD-10-CM | POA: Diagnosis not present

## 2021-07-31 DIAGNOSIS — E7800 Pure hypercholesterolemia, unspecified: Secondary | ICD-10-CM

## 2021-07-31 DIAGNOSIS — I2510 Atherosclerotic heart disease of native coronary artery without angina pectoris: Secondary | ICD-10-CM

## 2021-07-31 DIAGNOSIS — G4733 Obstructive sleep apnea (adult) (pediatric): Secondary | ICD-10-CM | POA: Diagnosis not present

## 2021-07-31 NOTE — Patient Instructions (Signed)

## 2021-08-22 ENCOUNTER — Encounter: Payer: Self-pay | Admitting: Family Medicine

## 2021-08-22 ENCOUNTER — Ambulatory Visit (INDEPENDENT_AMBULATORY_CARE_PROVIDER_SITE_OTHER): Payer: Medicare Other | Admitting: Family Medicine

## 2021-08-22 VITALS — BP 135/79 | HR 62 | Temp 97.5°F | Ht 65.5 in | Wt 174.0 lb

## 2021-08-22 DIAGNOSIS — R0981 Nasal congestion: Secondary | ICD-10-CM | POA: Diagnosis not present

## 2021-08-22 DIAGNOSIS — R051 Acute cough: Secondary | ICD-10-CM

## 2021-08-22 DIAGNOSIS — J0110 Acute frontal sinusitis, unspecified: Secondary | ICD-10-CM

## 2021-08-22 LAB — VERITOR FLU A/B WAIVED
Influenza A: NEGATIVE
Influenza B: NEGATIVE

## 2021-08-22 MED ORDER — AMOXICILLIN 500 MG PO CAPS: 500.0000 mg | ORAL_CAPSULE | Freq: Two times a day (BID) | ORAL | 0 refills | Status: DC

## 2021-08-22 MED ORDER — FLUTICASONE PROPIONATE 50 MCG/ACT NA SUSP: 1.0000 | Freq: Two times a day (BID) | NASAL | 6 refills | Status: AC | PRN

## 2021-08-22 NOTE — Progress Notes (Signed)
BP 135/79   Pulse 62   Temp (!) 97.5 F (36.4 C)   Ht 5' 5.5" (1.664 m)   Wt 174 lb (78.9 kg)   SpO2 93%   BMI 28.51 kg/m    Subjective:   Patient ID: Corey Duran, male    DOB: 1950/05/08, 71 y.o.   MRN: [id number]  HPI: Corey Duran is a 71 y.o. male presenting on 08/22/2021 for URI (Cough, congestion)   HPI Cough and congestion Patient is coming in today for cough and congestion.  He says he started a couple days ago with cough and congestion and got it from his girlfriend who was sick last week with similar symptoms and is still recovering from it.  He feels like he has a lot of sinus pressure and drainage and congestion.  But he also says he just generally does not feel well and has body aches and fatigue and malaise and just not feeling well.  He says it feels worse than just a normal cold with all the pressure and congestion.  He says his girlfriend was treated with prednisone and something else but he cannot recall exactly what it was.  He has been coughing a lot and has been producing phlegm and has a lot of congestion and coughing especially at night as well.  He has tried over-the-counter Sudafed and then using menthol and honey and lemon to help with the cough and they do help a little bit but are not clearing it.  He does feel like he gets some wheezing with that as well.  Relevant past medical, surgical, family and social history reviewed and updated as indicated. Interim medical history since our last visit reviewed. Allergies and medications reviewed and updated.  Review of Systems  Constitutional:  Negative for chills and fever.  HENT:  Positive for congestion, postnasal drip, rhinorrhea and sinus pressure. Negative for ear discharge, ear pain, sneezing, sore throat and voice change.   Eyes:  Negative for visual disturbance.  Respiratory:  Positive for cough and wheezing. Negative for shortness of breath.   Cardiovascular:  Negative for chest pain and leg  swelling.  Musculoskeletal:  Negative for gait problem.  Skin:  Negative for rash.  All other systems reviewed and are negative.  Per HPI unless specifically indicated above       Objective:   BP 135/79   Pulse 62   Temp (!) 97.5 F (36.4 C)   Ht 5' 5.5" (1.664 m)   Wt 174 lb (78.9 kg)   SpO2 93%   BMI 28.51 kg/m   Wt Readings from Last 3 Encounters:  08/22/21 174 lb (78.9 kg)  07/31/21 176 lb (79.8 kg)  03/22/21 174 lb (78.9 kg)    Physical Exam Vitals and nursing note reviewed.  Constitutional:      General: He is not in acute distress.    Appearance: He is well-developed. He is not diaphoretic.  HENT:     Right Ear: Tympanic membrane, ear canal and external ear normal.     Left Ear: Tympanic membrane, ear canal and external ear normal.     Nose: Mucosal edema and rhinorrhea present.     Right Sinus: Frontal sinus tenderness present. No maxillary sinus tenderness.     Left Sinus: Frontal sinus tenderness present. No maxillary sinus tenderness.     Mouth/Throat:     Pharynx: Uvula midline. No oropharyngeal exudate or posterior oropharyngeal erythema.     Tonsils: No tonsillar abscesses.  Eyes:     General: No scleral icterus.       Right eye: No discharge.     Conjunctiva/sclera: Conjunctivae normal.     Pupils: Pupils are equal, round, and reactive to light.  Neck:     Thyroid: No thyromegaly.  Cardiovascular:     Rate and Rhythm: Normal rate and regular rhythm.     Heart sounds: Normal heart sounds. No murmur heard. Pulmonary:     Effort: Pulmonary effort is normal. No tachypnea, bradypnea, accessory muscle usage or respiratory distress.     Breath sounds: No decreased air movement. Examination of the right-upper field reveals rhonchi. Examination of the left-upper field reveals rhonchi. Rhonchi present. No decreased breath sounds, wheezing or rales.  Musculoskeletal:        General: Normal range of motion.     Cervical back: Neck supple.  Lymphadenopathy:      Cervical: No cervical adenopathy.  Skin:    General: Skin is warm and dry.     Findings: No rash.  Neurological:     Mental Status: He is alert and oriented to person, place, and time.     Coordination: Coordination normal.  Psychiatric:        Behavior: Behavior normal.    COVID takes couple days and will be pending  Rapid flu: Negative  Assessment & Plan:   Problem List Items Addressed This Visit   None Visit Diagnoses     Nasal congestion    -  Primary   Relevant Medications   amoxicillin (AMOXIL) 500 MG capsule   fluticasone (FLONASE) 50 MCG/ACT nasal spray   Other Relevant Orders   Veritor Flu A/B Waived   Novel Coronavirus, NAA ([facility])   Acute cough       Relevant Medications   amoxicillin (AMOXIL) 500 MG capsule   fluticasone (FLONASE) 50 MCG/ACT nasal spray   Other Relevant Orders   Veritor Flu A/B Waived   Novel Coronavirus, NAA ([facility])   Acute non-recurrent frontal sinusitis       Relevant Medications   amoxicillin (AMOXIL) 500 MG capsule   fluticasone (FLONASE) 50 MCG/ACT nasal spray       We will treat like sinus infection and give Flonase and amoxicillin.  Also recommended continue Mucinex Follow up plan: Return if symptoms worsen or fail to improve.  Counseling provided for all of the vaccine components Orders Placed This Encounter  Procedures   Novel Coronavirus, NAA ([facility])   Veritor Flu A/B [place] Averie Hornbaker, MD [facility] 08/22/2021, 11:19 AM

## 2021-08-23 LAB — NOVEL CORONAVIRUS, NAA: SARS-CoV-2, NAA: NOT DETECTED

## 2021-09-21 ENCOUNTER — Ambulatory Visit (INDEPENDENT_AMBULATORY_CARE_PROVIDER_SITE_OTHER): Payer: Medicare Other | Admitting: Family Medicine

## 2021-09-21 ENCOUNTER — Encounter: Payer: Self-pay | Admitting: Family Medicine

## 2021-09-21 VITALS — BP 137/79 | HR 49 | Temp 98.0°F | Ht 65.5 in | Wt 178.0 lb

## 2021-09-21 DIAGNOSIS — I2510 Atherosclerotic heart disease of native coronary artery without angina pectoris: Secondary | ICD-10-CM

## 2021-09-21 DIAGNOSIS — E039 Hypothyroidism, unspecified: Secondary | ICD-10-CM

## 2021-09-21 DIAGNOSIS — E7800 Pure hypercholesterolemia, unspecified: Secondary | ICD-10-CM | POA: Diagnosis not present

## 2021-09-21 DIAGNOSIS — R351 Nocturia: Secondary | ICD-10-CM | POA: Diagnosis not present

## 2021-09-21 DIAGNOSIS — N401 Enlarged prostate with lower urinary tract symptoms: Secondary | ICD-10-CM

## 2021-09-21 DIAGNOSIS — M1611 Unilateral primary osteoarthritis, right hip: Secondary | ICD-10-CM

## 2021-09-21 DIAGNOSIS — Z0001 Encounter for general adult medical examination with abnormal findings: Secondary | ICD-10-CM | POA: Diagnosis not present

## 2021-09-21 DIAGNOSIS — Z0000 Encounter for general adult medical examination without abnormal findings: Secondary | ICD-10-CM

## 2021-09-21 DIAGNOSIS — I10 Essential (primary) hypertension: Secondary | ICD-10-CM

## 2021-09-21 MED ORDER — LEVOTHYROXINE SODIUM 50 MCG PO TABS: 50.0000 ug | ORAL_TABLET | Freq: Every day | ORAL | 3 refills | Status: DC

## 2021-09-21 MED ORDER — TAMSULOSIN HCL 0.4 MG PO CAPS: 0.4000 mg | ORAL_CAPSULE | Freq: Every day | ORAL | 3 refills | Status: DC

## 2021-09-21 MED ORDER — VALACYCLOVIR HCL 500 MG PO TABS
500.0000 mg | ORAL_TABLET | Freq: Every day | ORAL | 1 refills | Status: DC | PRN
Start: 2021-09-21 — End: 2023-08-18

## 2021-09-21 MED ORDER — EZETIMIBE-SIMVASTATIN 10-80 MG PO TABS: 0.5000 | ORAL_TABLET | Freq: Every day | ORAL | 3 refills | Status: DC

## 2021-09-21 MED ORDER — METOPROLOL TARTRATE 25 MG PO TABS: ORAL_TABLET | ORAL | 3 refills | Status: DC

## 2021-09-21 MED ORDER — CLOPIDOGREL BISULFATE 75 MG PO TABS: ORAL_TABLET | ORAL | 3 refills | Status: DC

## 2021-09-21 MED ORDER — MELOXICAM 15 MG PO TABS: ORAL_TABLET | ORAL | 3 refills | Status: DC

## 2021-09-21 MED ORDER — FINASTERIDE 5 MG PO TABS: ORAL_TABLET | ORAL | 3 refills | Status: DC

## 2021-09-21 MED ORDER — NITROGLYCERIN 0.4 MG SL SUBL
0.4000 mg | SUBLINGUAL_TABLET | SUBLINGUAL | 3 refills | Status: DC | PRN
Start: 2021-09-21 — End: 2023-11-13

## 2021-09-22 LAB — PSA, TOTAL AND FREE
PSA, Free Pct: 28.3 %
PSA, Free: 0.68 ng/mL
Prostate Specific Ag, Serum: 2.4 ng/mL (ref 0.0–4.0)

## 2021-09-22 LAB — CMP14+EGFR
ALT: 30 IU/L (ref 0–44)
AST: 23 IU/L (ref 0–40)
Albumin/Globulin Ratio: 2 (ref 1.2–2.2)
Albumin: 4.3 g/dL (ref 3.7–4.7)
Alkaline Phosphatase: 71 IU/L (ref 44–121)
BUN/Creatinine Ratio: 19 (ref 10–24)
BUN: 20 mg/dL (ref 8–27)
Bilirubin Total: 0.4 mg/dL (ref 0.0–1.2)
CO2: 25 mmol/L (ref 20–29)
Calcium: 9.1 mg/dL (ref 8.6–10.2)
Chloride: 104 mmol/L (ref 96–106)
Creatinine, Ser: 1.04 mg/dL (ref 0.76–1.27)
Globulin, Total: 2.1 g/dL (ref 1.5–4.5)
Glucose: 99 mg/dL (ref 70–99)
Potassium: 4.4 mmol/L (ref 3.5–5.2)
Sodium: 139 mmol/L (ref 134–144)
Total Protein: 6.4 g/dL (ref 6.0–8.5)
eGFR: 76 mL/min/1.73

## 2021-09-22 LAB — CBC WITH DIFFERENTIAL/PLATELET
Basophils Absolute: 0 x10E3/uL (ref 0.0–0.2)
Basos: 1 %
EOS (ABSOLUTE): 0.2 x10E3/uL (ref 0.0–0.4)
Eos: 4 %
Hematocrit: 45.9 % (ref 37.5–51.0)
Hemoglobin: 15.7 g/dL (ref 13.0–17.7)
Immature Grans (Abs): 0 x10E3/uL (ref 0.0–0.1)
Immature Granulocytes: 0 %
Lymphocytes Absolute: 1.5 x10E3/uL (ref 0.7–3.1)
Lymphs: 28 %
MCH: 31.7 pg (ref 26.6–33.0)
MCHC: 34.2 g/dL (ref 31.5–35.7)
MCV: 93 fL (ref 79–97)
Monocytes Absolute: 0.3 x10E3/uL (ref 0.1–0.9)
Monocytes: 7 %
Neutrophils Absolute: 3.2 x10E3/uL (ref 1.4–7.0)
Neutrophils: 60 %
Platelets: 147 x10E3/uL — ABNORMAL LOW (ref 150–450)
RBC: 4.96 x10E6/uL (ref 4.14–5.80)
RDW: 12.3 % (ref 11.6–15.4)
WBC: 5.2 x10E3/uL (ref 3.4–10.8)

## 2021-09-22 LAB — LIPID PANEL
Chol/HDL Ratio: 4.1 ratio (ref 0.0–5.0)
Cholesterol, Total: 147 mg/dL (ref 100–199)
HDL: 36 mg/dL — ABNORMAL LOW
LDL Chol Calc (NIH): 82 mg/dL (ref 0–99)
Triglycerides: 171 mg/dL — ABNORMAL HIGH (ref 0–149)
VLDL Cholesterol Cal: 29 mg/dL (ref 5–40)

## 2021-09-22 LAB — TSH: TSH: 3.67 u[IU]/mL (ref 0.450–4.500)

## 2021-09-26 DIAGNOSIS — M25512 Pain in left shoulder: Secondary | ICD-10-CM | POA: Diagnosis not present

## 2021-09-26 DIAGNOSIS — M19012 Primary osteoarthritis, left shoulder: Secondary | ICD-10-CM | POA: Diagnosis not present

## 2021-09-26 DIAGNOSIS — M67912 Unspecified disorder of synovium and tendon, left shoulder: Secondary | ICD-10-CM | POA: Diagnosis not present

## 2021-10-04 DIAGNOSIS — S43422D Sprain of left rotator cuff capsule, subsequent encounter: Secondary | ICD-10-CM | POA: Diagnosis not present

## 2021-10-09 DIAGNOSIS — S43422D Sprain of left rotator cuff capsule, subsequent encounter: Secondary | ICD-10-CM | POA: Diagnosis not present

## 2021-10-11 DIAGNOSIS — S43422D Sprain of left rotator cuff capsule, subsequent encounter: Secondary | ICD-10-CM | POA: Diagnosis not present

## 2021-10-16 DIAGNOSIS — S43422D Sprain of left rotator cuff capsule, subsequent encounter: Secondary | ICD-10-CM | POA: Diagnosis not present

## 2021-10-18 DIAGNOSIS — S43422D Sprain of left rotator cuff capsule, subsequent encounter: Secondary | ICD-10-CM | POA: Diagnosis not present

## 2021-10-23 DIAGNOSIS — S43422D Sprain of left rotator cuff capsule, subsequent encounter: Secondary | ICD-10-CM | POA: Diagnosis not present

## 2021-10-25 DIAGNOSIS — S43422D Sprain of left rotator cuff capsule, subsequent encounter: Secondary | ICD-10-CM | POA: Diagnosis not present

## 2021-10-31 DIAGNOSIS — M67912 Unspecified disorder of synovium and tendon, left shoulder: Secondary | ICD-10-CM | POA: Diagnosis not present

## 2021-11-06 DIAGNOSIS — S43422D Sprain of left rotator cuff capsule, subsequent encounter: Secondary | ICD-10-CM | POA: Diagnosis not present

## 2021-11-09 DIAGNOSIS — S43422D Sprain of left rotator cuff capsule, subsequent encounter: Secondary | ICD-10-CM | POA: Diagnosis not present

## 2021-11-13 DIAGNOSIS — S43422D Sprain of left rotator cuff capsule, subsequent encounter: Secondary | ICD-10-CM | POA: Diagnosis not present

## 2021-11-16 DIAGNOSIS — S43422D Sprain of left rotator cuff capsule, subsequent encounter: Secondary | ICD-10-CM | POA: Diagnosis not present

## 2021-11-20 DIAGNOSIS — S43422D Sprain of left rotator cuff capsule, subsequent encounter: Secondary | ICD-10-CM | POA: Diagnosis not present

## 2021-11-22 DIAGNOSIS — S43422D Sprain of left rotator cuff capsule, subsequent encounter: Secondary | ICD-10-CM | POA: Diagnosis not present

## 2021-11-27 DIAGNOSIS — S43422D Sprain of left rotator cuff capsule, subsequent encounter: Secondary | ICD-10-CM | POA: Diagnosis not present

## 2021-12-04 DIAGNOSIS — H5203 Hypermetropia, bilateral: Secondary | ICD-10-CM | POA: Diagnosis not present

## 2021-12-04 DIAGNOSIS — H2513 Age-related nuclear cataract, bilateral: Secondary | ICD-10-CM | POA: Diagnosis not present

## 2021-12-04 DIAGNOSIS — H524 Presbyopia: Secondary | ICD-10-CM | POA: Diagnosis not present

## 2021-12-04 DIAGNOSIS — H40013 Open angle with borderline findings, low risk, bilateral: Secondary | ICD-10-CM | POA: Diagnosis not present

## 2022-01-23 DIAGNOSIS — N400 Enlarged prostate without lower urinary tract symptoms: Secondary | ICD-10-CM | POA: Diagnosis not present

## 2022-02-04 DIAGNOSIS — H40013 Open angle with borderline findings, low risk, bilateral: Secondary | ICD-10-CM | POA: Diagnosis not present

## 2022-02-04 DIAGNOSIS — H2513 Age-related nuclear cataract, bilateral: Secondary | ICD-10-CM | POA: Diagnosis not present

## 2022-02-18 DIAGNOSIS — N400 Enlarged prostate without lower urinary tract symptoms: Secondary | ICD-10-CM | POA: Diagnosis not present

## 2022-02-20 DIAGNOSIS — N400 Enlarged prostate without lower urinary tract symptoms: Secondary | ICD-10-CM | POA: Diagnosis not present

## 2022-03-13 ENCOUNTER — Ambulatory Visit (INDEPENDENT_AMBULATORY_CARE_PROVIDER_SITE_OTHER): Payer: Medicare Other | Admitting: Nurse Practitioner

## 2022-03-13 ENCOUNTER — Encounter: Payer: Self-pay | Admitting: Nurse Practitioner

## 2022-03-13 DIAGNOSIS — R509 Fever, unspecified: Secondary | ICD-10-CM | POA: Diagnosis not present

## 2022-03-13 DIAGNOSIS — R051 Acute cough: Secondary | ICD-10-CM

## 2022-03-13 DIAGNOSIS — R0981 Nasal congestion: Secondary | ICD-10-CM

## 2022-03-13 MED ORDER — SALINE SPRAY 0.65 % NA SOLN: 1.0000 | NASAL | 3 refills | Status: AC | PRN

## 2022-03-13 MED ORDER — ACETAMINOPHEN 500 MG PO TABS: 500.0000 mg | ORAL_TABLET | Freq: Four times a day (QID) | ORAL | 0 refills | Status: AC | PRN

## 2022-03-13 MED ORDER — BENZONATATE 100 MG PO CAPS: 100.0000 mg | ORAL_CAPSULE | Freq: Three times a day (TID) | ORAL | 0 refills | Status: DC | PRN

## 2022-03-13 NOTE — Progress Notes (Signed)
Virtual Visit  Note Due to COVID-19 pandemic this visit was conducted virtually. This visit type was conducted due to national recommendations for restrictions regarding the COVID-19 Pandemic (e.g. social distancing, sheltering in place) in an effort to limit this patient's exposure and mitigate transmission in our community. All issues noted in this document were discussed and addressed.  A physical exam was not performed with this format.  I connected with Corey Duran on 03/13/22 at 1:05 PM by telephone and verified that I am speaking with the correct person using two identifiers. Corey Duran is currently located at home during visit. The provider, Ivy Lynn, NP is located in their office at time of visit.  I discussed the limitations, risks, security and privacy concerns of performing an evaluation and management service by telephone and the availability of in person appointments. I also discussed with the patient that there may be a patient responsible charge related to this service. The patient expressed understanding and agreed to proceed.   History and Present Illness:  Cough This is a new problem. The current episode started in the past 7 days. The problem has been gradually worsening. The problem occurs constantly. The cough is Productive of sputum. Associated symptoms include chills and a fever. Pertinent negatives include no ear pain or rash. Nothing aggravates the symptoms. Risk factors for lung disease include animal exposure. He has tried OTC cough suppressant for the symptoms. The treatment provided no relief.  URI  This is a new problem. The current episode started yesterday. The problem has been unchanged. The maximum temperature recorded prior to his arrival was 100.4 - 100.9 F. Associated symptoms include congestion and coughing. Pertinent negatives include no ear pain or rash.  Fever  This is a new problem. The current episode started yesterday. The problem occurs  intermittently. The problem has been gradually improving. The maximum temperature noted was 100 to 100.9 F. Associated symptoms include congestion and coughing. Pertinent negatives include no ear pain or rash. He has tried acetaminophen for the symptoms. The treatment provided no relief.      Review of Systems  Constitutional:  Positive for chills and fever.  HENT:  Positive for congestion. Negative for ear pain and tinnitus.   Respiratory:  Positive for cough.   Cardiovascular: Negative.   Skin: Negative.  Negative for itching and rash.     Observations/Objective: Patient presents with symptoms of upper respiratory infection with cough and congestion in the past 4 to 5 days.  Advised patient toTake meds as prescribed - Use a cool mist humidifier  -Use saline nose sprays frequently -Force fluids -For fever or aches or pains- take Tylenol or ibuprofen. -Completed RSV, flu, and COVID swab results pending.  Assessment and Plan: Televisit patient not in distress  Follow Up Instructions: Follow-up for worsening unresolved symptoms    I discussed the assessment and treatment plan with the patient. The patient was provided an opportunity to ask questions and all were answered. The patient agreed with the plan and demonstrated an understanding of the instructions.   The patient was advised to call back or seek an in-person evaluation if the symptoms worsen or if the condition fails to improve as anticipated.  The above assessment and management plan was discussed with the patient. The patient verbalized understanding of and has agreed to the management plan. Patient is aware to call the clinic if symptoms persist or worsen. Patient is aware when to return to the clinic for a follow-up  visit. Patient educated on when it is appropriate to go to the emergency department.   Time call ended: 1:22 PM  I provided 12 minutes of  non face-to-face time during this encounter.    Ivy Lynn, NP

## 2022-03-13 NOTE — Patient Instructions (Signed)

## 2022-03-14 ENCOUNTER — Telehealth: Payer: Self-pay | Admitting: Family Medicine

## 2022-03-14 NOTE — Telephone Encounter (Signed)
Dr. Warrick Parisian,  Pt had virtual visit with Je yesterday. Swabs still pending. Do you want to wait on results before prescribing anything?

## 2022-03-14 NOTE — Telephone Encounter (Signed)
LM MAKING PT AWARE

## 2022-03-14 NOTE — Telephone Encounter (Signed)
Use Mucinex Tylenol and ibuprofen and Flonase for now, it is likely flu or COVID because both are going around but they are treated differently so we will wait to see it come back, likely will come back later today or early tomorrow

## 2022-03-15 ENCOUNTER — Other Ambulatory Visit: Payer: Self-pay | Admitting: Nurse Practitioner

## 2022-03-15 DIAGNOSIS — J101 Influenza due to other identified influenza virus with other respiratory manifestations: Secondary | ICD-10-CM

## 2022-03-15 LAB — COVID-19, FLU A+B AND RSV
Influenza A, NAA: DETECTED — AB
Influenza B, NAA: NOT DETECTED
RSV, NAA: NOT DETECTED
SARS-CoV-2, NAA: NOT DETECTED

## 2022-03-15 MED ORDER — OSELTAMIVIR PHOSPHATE 75 MG PO CAPS: 75.0000 mg | ORAL_CAPSULE | Freq: Every day | ORAL | 0 refills | Status: DC

## 2022-03-20 ENCOUNTER — Encounter: Payer: Self-pay | Admitting: Family Medicine

## 2022-03-20 ENCOUNTER — Ambulatory Visit (INDEPENDENT_AMBULATORY_CARE_PROVIDER_SITE_OTHER): Payer: Medicare Other | Admitting: Family Medicine

## 2022-03-20 VITALS — BP 136/77 | HR 51 | Temp 98.0°F | Ht 65.5 in | Wt 182.0 lb

## 2022-03-20 DIAGNOSIS — E039 Hypothyroidism, unspecified: Secondary | ICD-10-CM

## 2022-03-20 DIAGNOSIS — I10 Essential (primary) hypertension: Secondary | ICD-10-CM

## 2022-03-20 DIAGNOSIS — E7800 Pure hypercholesterolemia, unspecified: Secondary | ICD-10-CM | POA: Diagnosis not present

## 2022-03-20 DIAGNOSIS — I2510 Atherosclerotic heart disease of native coronary artery without angina pectoris: Secondary | ICD-10-CM | POA: Diagnosis not present

## 2022-03-20 NOTE — Progress Notes (Signed)
BP 136/77   Pulse (!) 51   Temp 98 F (36.7 C)   Ht 5' 5.5" (1.664 m)   Wt 182 lb (82.6 kg)   SpO2 95%   BMI 29.83 kg/m    Subjective:   Patient ID: Corey Duran, male    DOB: 08-Dec-1950, 71 y.o.   MRN: [id number]  HPI: Corey Duran is a 71 y.o. male presenting on 03/20/2022 for Medical Management of Chronic Issues and Hypothyroidism   HPI Hypertension Patient is currently on metoprolol, and their blood pressure today is 136/77. Patient denies any lightheadedness or dizziness. Patient denies headaches, blurred vision, chest pains, shortness of breath, or weakness. Denies any side effects from medication and is content with current medication.   Hyperlipidemia and CAD Patient is coming in for recheck of his hyperlipidemia. The patient is currently taking Vytorin and fish oil. They deny any issues with myalgias or history of liver damage from it. They deny any focal numbness or weakness or chest pain.   Hypothyroidism recheck Patient is coming in for thyroid recheck today as well. They deny any issues with hair changes or heat or cold problems or diarrhea or constipation. They deny any chest pain or palpitations. They are currently on levothyroxine 50 micrograms   Patient is complaining of fatigue but he also admits in the same moment that he is waking up every hour or 2 because of his prostate.  He is already on finasteride and tamsulosin twice a day.  He does have a urology and is following up with them.  Relevant past medical, surgical, family and social history reviewed and updated as indicated. Interim medical history since our last visit reviewed. Allergies and medications reviewed and updated.  Review of Systems  Constitutional:  Positive for fatigue. Negative for chills and fever.  Eyes:  Negative for visual disturbance.  Respiratory:  Negative for shortness of breath and wheezing.   Cardiovascular:  Negative for chest pain and leg swelling.  Genitourinary:   Positive for frequency.  Musculoskeletal:  Negative for back pain and gait problem.  Skin:  Negative for rash.  Psychiatric/Behavioral:  Positive for sleep disturbance.   All other systems reviewed and are negative.   Per HPI unless specifically indicated above   Allergies as of 03/20/2022   No Known Allergies      Medication List        Accurate as of March 20, 2022  9:07 AM. If you have any questions, ask your nurse or doctor.          acetaminophen 500 MG tablet Commonly known as: TYLENOL Take 1 tablet (500 mg total) by mouth every 6 (six) hours as needed.   BEE POLLEN PO Take 1-2 capsules by mouth See admin instructions. Take 2 capsule by mouth in the morning and 1 capsule in the evening   benzonatate 100 MG capsule Commonly known as: Tessalon Perles Take 1 capsule (100 mg total) by mouth 3 (three) times daily as needed.   clopidogrel 75 MG tablet Commonly known as: PLAVIX Take one tablet daily   CoQ10 100 MG Caps Take 100 mg by mouth daily.   ezetimibe-simvastatin 10-80 MG tablet Commonly known as: VYTORIN Take 0.5 tablets by mouth daily.   finasteride 5 MG tablet Commonly known as: PROSCAR TAKE 1 TABLET(5 MG) BY MOUTH DAILY   Fish Oil 1000 MG Caps Take 1,000 mg by mouth every evening.   fluticasone 50 MCG/ACT nasal spray Commonly known as: FLONASE Place 1  spray into both nostrils 2 (two) times daily as needed for allergies or rhinitis.   levothyroxine 50 MCG tablet Commonly known as: SYNTHROID Take 1 tablet (50 mcg total) by mouth daily.   meloxicam 15 MG tablet Commonly known as: MOBIC TAKE 1 TABLET(15 MG) BY MOUTH DAILY   metoprolol tartrate 25 MG tablet Commonly known as: LOPRESSOR Take one tablet twice daily   MOVE FREE PO Take 1 tablet by mouth daily.   MULTIVITAMIN PO Take 1 tablet by mouth daily.   nitroGLYCERIN 0.4 MG SL tablet Commonly known as: NITROSTAT Place 1 tablet (0.4 mg total) under the tongue every 5 (five)  minutes as needed for chest pain.   oseltamivir 75 MG capsule Commonly known as: Tamiflu Take 1 capsule (75 mg total) by mouth daily.   OVER THE COUNTER MEDICATION Take 3 capsules by mouth every morning. Prostagenix   sodium chloride 0.65 % Soln nasal spray Commonly known as: OCEAN Place 1 spray into both nostrils as needed for congestion.   tamsulosin 0.4 MG Caps capsule Commonly known as: FLOMAX Take 1 capsule (0.4 mg total) by mouth daily after supper.   TURMERIC CURCUMIN PO Take 1,000 mg by mouth daily.   UNABLE TO FIND Med Name: Move Free supp OTC - one a day   valACYclovir 500 MG tablet Commonly known as: VALTREX Take 1 tablet (500 mg total) by mouth daily as needed (outbreaks).   Vitamin D 50 MCG (2000 UT) Caps Take 2,000 Units by mouth daily.   Zinc 50 MG Caps Take 50 mg by mouth daily.         Objective:   BP 136/77   Pulse (!) 51   Temp 98 F (36.7 C)   Ht 5' 5.5" (1.664 m)   Wt 182 lb (82.6 kg)   SpO2 95%   BMI 29.83 kg/m   Wt Readings from Last 3 Encounters:  03/20/22 182 lb (82.6 kg)  09/21/21 178 lb (80.7 kg)  08/22/21 174 lb (78.9 kg)    Physical Exam Vitals and nursing note reviewed.  Constitutional:      General: He is not in acute distress.    Appearance: He is well-developed. He is not diaphoretic.  Eyes:     General: No scleral icterus.    Conjunctiva/sclera: Conjunctivae normal.  Neck:     Thyroid: No thyromegaly.  Cardiovascular:     Rate and Rhythm: Normal rate and regular rhythm.     Heart sounds: Normal heart sounds. No murmur heard. Pulmonary:     Effort: Pulmonary effort is normal. No respiratory distress.     Breath sounds: Normal breath sounds. No wheezing.  Abdominal:     General: Abdomen is flat. Bowel sounds are normal. There is no distension.     Tenderness: There is no abdominal tenderness. There is no guarding or rebound.  Musculoskeletal:        General: Normal range of motion.     Cervical back: Neck  supple.  Lymphadenopathy:     Cervical: No cervical adenopathy.  Skin:    General: Skin is warm and dry.     Findings: No rash.  Neurological:     Mental Status: He is alert and oriented to person, place, and time.     Coordination: Coordination normal.  Psychiatric:        Behavior: Behavior normal.       Assessment & Plan:   Problem List Items Addressed This Visit       Cardiovascular  and Mediastinum   Essential hypertension, benign   Relevant Orders   CBC with Differential/Platelet   CMP14+EGFR   Lipid panel   TSH   Coronary atherosclerosis of native coronary artery     Endocrine   Hypothyroidism   Relevant Orders   CBC with Differential/Platelet   CMP14+EGFR   Lipid panel   TSH     Other   Hypercholesteremia - Primary   Relevant Orders   CBC with Differential/Platelet   CMP14+EGFR   Lipid panel   TSH    I think the reason for his fatigue is that he is waking up every hour or 2 at night to urinate.  Recommended that he go ahead and proceed with the procedure to improve his prostate issues.  Will check blood work today.  Blood pressure and heart rate and everything looks stable. Follow up plan: Return in about 6 months (around 09/19/2022), or if symptoms worsen or fail to improve, for Physical and hypertension and cholesterol and thyroid.  Counseling provided for all of the vaccine components Orders Placed This Encounter  Procedures   CBC with Differential/Platelet   CMP14+EGFR   Lipid panel   TSH    Caryl Pina, MD [facility] 03/20/2022, 9:07 AM

## 2022-03-21 DIAGNOSIS — D692 Other nonthrombocytopenic purpura: Secondary | ICD-10-CM | POA: Diagnosis not present

## 2022-03-21 DIAGNOSIS — C44519 Basal cell carcinoma of skin of other part of trunk: Secondary | ICD-10-CM | POA: Diagnosis not present

## 2022-03-21 DIAGNOSIS — D2261 Melanocytic nevi of right upper limb, including shoulder: Secondary | ICD-10-CM | POA: Diagnosis not present

## 2022-03-21 DIAGNOSIS — Z85828 Personal history of other malignant neoplasm of skin: Secondary | ICD-10-CM | POA: Diagnosis not present

## 2022-03-21 DIAGNOSIS — D225 Melanocytic nevi of trunk: Secondary | ICD-10-CM | POA: Diagnosis not present

## 2022-03-21 LAB — CMP14+EGFR
ALT: 36 IU/L (ref 0–44)
AST: 28 IU/L (ref 0–40)
Albumin/Globulin Ratio: 1.8 (ref 1.2–2.2)
Albumin: 4.2 g/dL (ref 3.8–4.8)
Alkaline Phosphatase: 65 IU/L (ref 44–121)
BUN/Creatinine Ratio: 21 (ref 10–24)
BUN: 23 mg/dL (ref 8–27)
Bilirubin Total: 0.4 mg/dL (ref 0.0–1.2)
CO2: 25 mmol/L (ref 20–29)
Calcium: 9.8 mg/dL (ref 8.6–10.2)
Chloride: 102 mmol/L (ref 96–106)
Creatinine, Ser: 1.08 mg/dL (ref 0.76–1.27)
Globulin, Total: 2.3 g/dL (ref 1.5–4.5)
Glucose: 102 mg/dL — ABNORMAL HIGH (ref 70–99)
Potassium: 4.8 mmol/L (ref 3.5–5.2)
Sodium: 139 mmol/L (ref 134–144)
Total Protein: 6.5 g/dL (ref 6.0–8.5)
eGFR: 72 mL/min/{1.73_m2} (ref >59)

## 2022-03-21 LAB — LIPID PANEL
Chol/HDL Ratio: 4.1 ratio (ref 0.0–5.0)
Cholesterol, Total: 123 mg/dL (ref 100–199)
HDL: 30 mg/dL — ABNORMAL LOW (ref >39)
LDL Chol Calc (NIH): 66 mg/dL (ref 0–99)
Triglycerides: 153 mg/dL — ABNORMAL HIGH (ref 0–149)
VLDL Cholesterol Cal: 27 mg/dL (ref 5–40)

## 2022-03-21 LAB — CBC WITH DIFFERENTIAL/PLATELET
Basophils Absolute: 0 10*3/uL (ref 0.0–0.2)
Basos: 0 %
EOS (ABSOLUTE): 0.2 10*3/uL (ref 0.0–0.4)
Eos: 3 %
Hematocrit: 46 % (ref 37.5–51.0)
Hemoglobin: 15.5 g/dL (ref 13.0–17.7)
Immature Grans (Abs): 0 10*3/uL (ref 0.0–0.1)
Immature Granulocytes: 0 %
Lymphocytes Absolute: 1.6 10*3/uL (ref 0.7–3.1)
Lymphs: 23 %
MCH: 31.2 pg (ref 26.6–33.0)
MCHC: 33.7 g/dL (ref 31.5–35.7)
MCV: 93 fL (ref 79–97)
Monocytes Absolute: 0.4 10*3/uL (ref 0.1–0.9)
Monocytes: 6 %
Neutrophils Absolute: 4.7 10*3/uL (ref 1.4–7.0)
Neutrophils: 68 %
Platelets: 203 10*3/uL (ref 150–450)
RBC: 4.97 x10E6/uL (ref 4.14–5.80)
RDW: 12.2 % (ref 11.6–15.4)
WBC: 6.9 10*3/uL (ref 3.4–10.8)

## 2022-03-21 LAB — TSH: TSH: 3.69 u[IU]/mL (ref 0.450–4.500)

## 2022-04-23 DIAGNOSIS — K080 Exfoliation of teeth due to systemic causes: Secondary | ICD-10-CM | POA: Diagnosis not present

## 2022-05-06 DIAGNOSIS — K080 Exfoliation of teeth due to systemic causes: Secondary | ICD-10-CM | POA: Diagnosis not present

## 2022-05-09 ENCOUNTER — Encounter: Payer: Self-pay | Admitting: *Deleted

## 2022-05-22 DIAGNOSIS — H2513 Age-related nuclear cataract, bilateral: Secondary | ICD-10-CM | POA: Diagnosis not present

## 2022-05-22 DIAGNOSIS — H40003 Preglaucoma, unspecified, bilateral: Secondary | ICD-10-CM | POA: Diagnosis not present

## 2022-05-23 DIAGNOSIS — H524 Presbyopia: Secondary | ICD-10-CM | POA: Diagnosis not present

## 2022-07-17 ENCOUNTER — Ambulatory Visit (INDEPENDENT_AMBULATORY_CARE_PROVIDER_SITE_OTHER): Payer: Medicare Other

## 2022-07-17 ENCOUNTER — Ambulatory Visit (INDEPENDENT_AMBULATORY_CARE_PROVIDER_SITE_OTHER): Payer: Medicare Other | Admitting: Family Medicine

## 2022-07-17 ENCOUNTER — Encounter: Payer: Self-pay | Admitting: Family Medicine

## 2022-07-17 VITALS — BP 122/64 | HR 64 | Temp 98.1°F | Ht 65.5 in | Wt 178.0 lb

## 2022-07-17 DIAGNOSIS — J40 Bronchitis, not specified as acute or chronic: Secondary | ICD-10-CM

## 2022-07-17 DIAGNOSIS — R059 Cough, unspecified: Secondary | ICD-10-CM | POA: Diagnosis not present

## 2022-07-17 DIAGNOSIS — J329 Chronic sinusitis, unspecified: Secondary | ICD-10-CM | POA: Diagnosis not present

## 2022-07-17 DIAGNOSIS — R0602 Shortness of breath: Secondary | ICD-10-CM | POA: Diagnosis not present

## 2022-07-17 MED ORDER — HYDROCODONE BIT-HOMATROP MBR 5-1.5 MG/5ML PO SOLN: 5.0000 mL | Freq: Four times a day (QID) | ORAL | 0 refills | Status: AC | PRN

## 2022-07-17 MED ORDER — MOXIFLOXACIN HCL 400 MG PO TABS: 400.0000 mg | ORAL_TABLET | Freq: Every day | ORAL | 0 refills | Status: DC

## 2022-07-17 MED ORDER — BETAMETHASONE SOD PHOS & ACET 6 (3-3) MG/ML IJ SUSP
6.0000 mg | Freq: Once | INTRAMUSCULAR | Status: AC
  Administered 2022-07-17: 6 mg via INTRAMUSCULAR

## 2022-07-17 NOTE — Progress Notes (Signed)
Chief Complaint  Patient presents with   Cough   Nasal Congestion   Fatigue   Headache    HPI  Patient presents today for Patient presents with upper respiratory congestion. Rhinorrhea that is frequently purulent. There is no ear ache or sore throat. Patient reports coughing frequently as well.  [place] sputum noted. There is no fever, chills or sweats. The patient denies being short of breath. Onset was 3-4 days ago. Gradually worsening. Tried OTCs without improvement.  PMH: Smoking status noted ROS: Per HPI  Objective: BP 122/64   Pulse 64   Temp 98.1 F (36.7 C)   Ht 5' 5.5" (1.664 m)   Wt 178 lb (80.7 kg)   SpO2 93%   BMI 29.17 kg/m  Gen: NAD, alert, cooperative with exam HEENT: NCAT, Nasal passages swollen,  CV: RRR, good S1/S2, no murmur Resp: Bronchitis changes with scattered wheezing  Ext: No edema, warm Neuro: Alert and oriented, No gross deficits  Assessment and plan:  1. Sinobronchitis     Meds ordered this encounter  Medications   betamethasone acetate-betamethasone sodium phosphate (CELESTONE) injection 6 mg   moxifloxacin (AVELOX) 400 MG tablet    Sig: Take 1 tablet (400 mg total) by mouth daily.    Dispense:  10 tablet    Refill:  0   HYDROcodone bit-homatropine (HYCODAN) 5-1.5 MG/5ML syrup    Sig: Take 5 mLs by mouth every 6 (six) hours as needed for up to 5 days for cough.    Dispense:  100 mL    Refill:  0    Orders Placed This Encounter  Procedures   Novel Coronavirus, NAA ([facility])    Order Specific Question:   Previously tested for COVID-19    Answer:   Yes    Order Specific Question:   Resident in a congregate (group) care setting    Answer:   No    Order Specific Question:   Is the patient student?    Answer:   No    Order Specific Question:   Employed in healthcare setting    Answer:   No    Order Specific Question:   Has patient completed COVID vaccination(s) (2 doses of Pfizer/Moderna 1 dose of [employer])    Answer:    Unknown   DG Chest 2 View    Standing Status:   Future    Standing Expiration Date:   08/16/2022    Order Specific Question:   Reason for Exam (SYMPTOM  OR DIAGNOSIS REQUIRED)    Answer:   cough, dyspnea, asthma    Order Specific Question:   Preferred imaging location?    Answer:   Internal    Follow up as needed.  Mechele Claude, MD

## 2022-07-18 ENCOUNTER — Telehealth: Payer: Self-pay | Admitting: Family Medicine

## 2022-07-18 LAB — NOVEL CORONAVIRUS, NAA: SARS-CoV-2, NAA: NOT DETECTED

## 2022-07-18 NOTE — Telephone Encounter (Signed)
Patient calling for x ray results from 4/17. Please review and call back.

## 2022-07-18 NOTE — Telephone Encounter (Signed)
Radiologist has not reviewed yet , advised pt that we would call as soon as we have the results

## 2022-07-19 NOTE — Telephone Encounter (Signed)
CXR is normal

## 2022-07-22 NOTE — Progress Notes (Signed)
Your chest x-ray looked normal. Thanks, WS.

## 2022-07-22 NOTE — Telephone Encounter (Signed)
Patient aware.

## 2022-08-05 ENCOUNTER — Other Ambulatory Visit: Payer: Self-pay | Admitting: Family Medicine

## 2022-08-05 DIAGNOSIS — N401 Enlarged prostate with lower urinary tract symptoms: Secondary | ICD-10-CM

## 2022-08-12 DIAGNOSIS — H40013 Open angle with borderline findings, low risk, bilateral: Secondary | ICD-10-CM | POA: Diagnosis not present

## 2022-08-14 ENCOUNTER — Other Ambulatory Visit: Payer: Self-pay | Admitting: Family Medicine

## 2022-08-14 ENCOUNTER — Telehealth: Payer: Self-pay | Admitting: Family Medicine

## 2022-08-14 MED ORDER — ECONAZOLE NITRATE 1 % EX CREA: TOPICAL_CREAM | Freq: Two times a day (BID) | CUTANEOUS | 5 refills | Status: DC

## 2022-08-14 MED ORDER — FLUCONAZOLE 100 MG PO TABS: ORAL_TABLET | ORAL | 0 refills | Status: DC

## 2022-08-14 NOTE — Telephone Encounter (Signed)
Scrip sent. Have him hold the ezetimibe/simvastatin while taking the pills

## 2022-08-14 NOTE — Telephone Encounter (Signed)
Called patient, no answer, left message to return call 

## 2022-08-14 NOTE — Telephone Encounter (Signed)
Pt was seen 07/17/2022. He now has a yeast infection. Pt asking for oral med and cream to be called in [facility] in [place] on [address]. The yeast burn is really bad around buttock area.

## 2022-08-14 NOTE — Telephone Encounter (Signed)
Message routed to [facility] for review since he was treating provider on 4/17.

## 2022-08-16 NOTE — Telephone Encounter (Signed)
Contacted patient. Notified patient. Patient verbalized understanding 

## 2022-08-23 ENCOUNTER — Telehealth: Payer: Self-pay

## 2022-08-23 DIAGNOSIS — D128 Benign neoplasm of rectum: Secondary | ICD-10-CM | POA: Diagnosis not present

## 2022-08-23 DIAGNOSIS — D369 Benign neoplasm, unspecified site: Secondary | ICD-10-CM | POA: Diagnosis not present

## 2022-08-23 DIAGNOSIS — D120 Benign neoplasm of cecum: Secondary | ICD-10-CM | POA: Diagnosis not present

## 2022-08-23 NOTE — Telephone Encounter (Signed)
   Pre-operative Risk Assessment    Patient Name: Corey Duran  DOB: 17-Dec-1950 MRN: [id number]      Request for Surgical Clearance    Procedure:   Colonoscopy  Date of Surgery:  Clearance 09/18/22                                 Surgeon:  Dr. Levora Angel Surgeon's Group or Practice Name:  [facility] Phone number:  [phone] Fax number:  [phone]   Type of Clearance Requested:   - Medical  - Pharmacy:  Hold Clopidogrel (Plavix) pt will need instructions on when/if to hold   Type of Anesthesia:   Propofol   Additional requests/questions:    Wynetta Fines   08/23/2022, 11:33 AM

## 2022-08-23 NOTE — Telephone Encounter (Signed)
Pt appt made 6/4 with Eligha Bridegroom, NP for preop clearance. Will update all parties involved.

## 2022-08-23 NOTE — Telephone Encounter (Signed)
   Name: BERWYN RIM  DOB: Jan 16, 1951  MRN: [id number]  Primary Cardiologist: Lance Muss, MD  Chart reviewed as part of pre-operative protocol coverage. Because of Kamen Hostetler Pierro's past medical history and time since last visit, he will require a follow-up in-office visit in order to better assess preoperative cardiovascular risk.  Pre-op covering staff: - Please schedule appointment and call patient to inform them. If patient already had an upcoming appointment within acceptable timeframe, please add "pre-op clearance" to the appointment notes so provider is aware. - Please contact requesting surgeon's office via preferred method (i.e, phone, fax) to inform them of need for appointment prior to surgery. Napoleon Form, Leodis Rains, NP  08/23/2022, 11:47 AM

## 2022-09-02 NOTE — Progress Notes (Unsigned)
Cardiology Office Note:    Date:  09/03/2022   ID:  Corey Duran, DOB 1951/03/06, MRN [id number]  PCP:  Dettinger, Elige Radon, MD   [facility] Providers Cardiologist:  Lance Muss, MD     Referring MD: Dettinger, Elige Radon, MD   Chief Complaint: preoperative cardiac evaluation  History of Present Illness:    Corey Duran is a very pleasant 72 y.o. male with a hx of CAD, OSA, HTN, and HLD.   He had stent to LAD (3.5 x 16 DES - [facility] study) in 2008.  Has been on CPAP in the past but stopped due to inconvenience.  Sleeps better with CPAP but does not like the hassle.  Stress test in January 2020 showed blood pressure demonstrated a normal response to exercise and no ST segment deviation during stress.  ETT with good tolerance (9 minutes), no chest pain, normal BP response, negative adequate ETT, Duke treadmill score 9.  He had a right total hip arthroplasty anterior approach 05/03/2020, he did well with the surgery. Bikes 12-14 miles/week. He considered TURP but it was cancelled due to size of prostate.   Last cardiology clinic visit was 07/31/2021 with Dr. Eldridge Dace. He reported no concerning symptoms for angina.  BP was a little high at home 145 mmHg systolic.  He improved his diet and BP office visit was improved. Advised to return in 1 year for follow-up.  Today, he is here alone for follow-up.  Has upcoming colonoscopy and is here for cardiac risk assessment.  Lingering cough for several months, seems to be improving. Symptoms started mid-December. Did not feel well enough to exercise and admits he has not fully resumed regular exercise regimen. Active around home, occasionally rides bike, yard work. Had some shortness of breath with congestion but it has resolved. No orthopnea, PND, edema, chest pain, dyspnea, palpitations, presyncope, syncope. Gets up every 1 1/2 hours to urinate, frequently feels tired at the end of the day. Rode his bike for 8-12 miles on 2 occasions, got  winded but no dyspnea, felt well overall. Has had resting HR in the 50s bpm for a long time. No bleeding concerns.   Past Medical History:  Diagnosis Date   Arthritis    BPH (benign prostatic hypertrophy)    followed by Dr. Wanda Plump in the past; Now Dr. Retta Diones   Coronary atherosclerosis of native coronary artery    05/2006, stent LAD, patent 03/2007, study stent   Heart murmur    Hypercholesteremia    Insomnia    Laceration of leg    severe, 2009   LBP (low back pain)    OSA (obstructive sleep apnea)    PSG 07/23/10 ESS 11, AHI 16/hr REM 18/hr, RDI 17/hr REM 20/hr, O2 min 87%; CPAP 08/22/10 CPAP 10 with AHI 0   Pneumonia    at age 66    Tenosynovitis of finger    left third finger    Past Surgical History:  Procedure Laterality Date   CORONARY ANGIOPLASTY     stent- 05/2006    ROTATOR CUFF REPAIR Right 0918/2018   TOTAL HIP ARTHROPLASTY Right 06/21/2020   Procedure: TOTAL HIP ARTHROPLASTY ANTERIOR APPROACH;  Surgeon: Ollen Gross, MD;  Location: [facility];  Service: Orthopedics;  Laterality: Right;     Current Medications: Current Meds  Medication Sig   acetaminophen (TYLENOL) 500 MG tablet Take 1 tablet (500 mg total) by mouth every 6 (six) hours as needed.   BEE POLLEN PO Take  1-2 capsules by mouth See admin instructions. Take 2 capsule by mouth in the morning and 1 capsule in the evening   Cholecalciferol (VITAMIN D) 50 MCG (2000 UT) CAPS Take 2,000 Units by mouth daily.   clopidogrel (PLAVIX) 75 MG tablet Take one tablet daily   Coenzyme Q10 (COQ10) 100 MG CAPS Take 100 mg by mouth daily.   ezetimibe-simvastatin (VYTORIN) 10-80 MG tablet Take 0.5 tablets by mouth daily.   finasteride (PROSCAR) 5 MG tablet TAKE 1 TABLET(5 MG) BY MOUTH DAILY   fluconazole (DIFLUCAN) 100 MG tablet Take two with first dose. Then starting the next day take one daily until all are taken.   Glucosamine-Chondroitin (MOVE FREE PO) Take 1 tablet by mouth daily.   levothyroxine (SYNTHROID)  50 MCG tablet Take 1 tablet (50 mcg total) by mouth daily.   meloxicam (MOBIC) 15 MG tablet TAKE 1 TABLET(15 MG) BY MOUTH DAILY   metoprolol tartrate (LOPRESSOR) 25 MG tablet Take one tablet twice daily   Multiple Vitamins-Minerals (MULTIVITAMIN PO) Take 1 tablet by mouth daily.   nitroGLYCERIN (NITROSTAT) 0.4 MG SL tablet Place 1 tablet (0.4 mg total) under the tongue every 5 (five) minutes as needed for chest pain.   Omega-3 Fatty Acids (FISH OIL) 1000 MG CAPS Take 1,000 mg by mouth every evening.   sodium chloride (OCEAN) 0.65 % SOLN nasal spray Place 1 spray into both nostrils as needed for congestion.   tamsulosin (FLOMAX) 0.4 MG CAPS capsule Take 1 capsule (0.4 mg total) by mouth daily after supper.   TURMERIC CURCUMIN PO Take 1,000 mg by mouth daily.   valACYclovir (VALTREX) 500 MG tablet Take 1 tablet (500 mg total) by mouth daily as needed (outbreaks).   Zinc 50 MG CAPS Take 50 mg by mouth daily.     Allergies:   Patient has no known allergies.   Social History   Socioeconomic History   Marital status: Divorced    Spouse name: Not on file   Number of children: Not on file   Years of education: Not on file   Highest education level: Not on file  Occupational History   Not on file  Tobacco Use   Smoking status: Former    Packs/day: 0.25    Years: 10.00    Additional pack years: 0.00    Total pack years: 2.50    Types: Cigarettes    Quit date: 2000    Years since quitting: 24.4   Smokeless tobacco: Never  Vaping Use   Vaping Use: Never used  Substance and Sexual Activity   Alcohol use: Yes    Comment: occ    Drug use: No   Sexual activity: Not on file  Other Topics Concern   Not on file  Social History Narrative   Not on file   Social Determinants of Health   Financial Resource Strain: Not on file  Food Insecurity: Not on file  Transportation Needs: Not on file  Physical Activity: Not on file  Stress: Not on file  Social Connections: Not on file      Family History: The patient's family history includes CAD in his father; Dementia in his mother; Heart attack in his father; Hypertension in his father; Ulcers in his father. There is no history of Stroke.  ROS:   Please see the history of present illness.   All other systems reviewed and are negative.  Labs/Other Studies Reviewed:    The following studies were reviewed today:  ETT 04/02/2018 Blood pressure  demonstrated a normal response to exercise. There was no ST segment deviation noted during stress.   ETT with good exercise tolerance (9:00); no chest pain; normal BP response; no ST changes; negative adequate ETT; Duke treadmill score 9.  Recent Labs: 03/20/2022: ALT 36; BUN 23; Creatinine, Ser 1.08; Hemoglobin 15.5; Platelets 203; Potassium 4.8; Sodium 139; TSH 3.690  Recent Lipid Panel    Component Value Date/Time   CHOL 123 03/20/2022 0913   TRIG 153 (H) 03/20/2022 0913   HDL 30 (L) 03/20/2022 0913   CHOLHDL 4.1 03/20/2022 0913   CHOLHDL 4.3 01/24/2015 1147   VLDL 32 (H) 01/24/2015 1147   LDLCALC 66 03/20/2022 0913   LDLDIRECT 70.4 03/29/2014 1141     Risk Assessment/Calculations:           Physical Exam:    VS:  BP 128/86   Pulse (!) 58   Ht 5' 5.5" (1.664 m)   Wt 174 lb 6.4 oz (79.1 kg)   SpO2 95%   BMI 28.58 kg/m     Wt Readings from Last 3 Encounters:  09/03/22 174 lb 6.4 oz (79.1 kg)  07/17/22 178 lb (80.7 kg)  03/20/22 182 lb (82.6 kg)     GEN:  Well nourished, well developed in no acute distress HEENT: Normal NECK: No JVD; No carotid bruits CARDIAC: RRR, no murmurs, rubs, gallops RESPIRATORY:  Clear to auscultation without rales, wheezing or rhonchi  ABDOMEN: Soft, non-tender, non-distended MUSCULOSKELETAL:  No edema; No deformity. 2+ pedal pulses, equal bilaterally SKIN: Warm and dry NEUROLOGIC:  Alert and oriented x 3 PSYCHIATRIC:  Normal affect   EKG:  EKG is ordered today.  The ekg ordered today demonstrates sinus bradycardia at 54  bpm, no ST abnormality      Diagnoses:    1. Atherosclerosis of native coronary artery of native heart without angina pectoris   2. Essential hypertension, benign   3. Preoperative cardiovascular examination   4. Dyslipidemia    Assessment and Plan:     Preoperative cardiac evaluation: According to the Revised Cardiac Risk Index (RCRI), his Perioperative Risk of Major Cardiac Event is (%): 0.9. His Functional Capacity in METs is: 7.59 according to the Duke Activity Status Index (DASI). The patient is doing well from a cardiac perspective. Therefore, based on ACC/AHA guidelines, the patient would be at acceptable risk for the planned procedure without further cardiovascular testing. Per office protocol, he may hold Plavix for 5 days prior to procedure and should resume as soon as hemodynamically stable postoperatively.  I will forward assessment to requesting provider.  CAD without angina: History of DES to LAD in 2008. No evidence of ischemia, low risk stress test 04/2018. Discussion regarding repeating stress test.  He is asymptomatic. Recently rode his bike for 8 miles and 12 miles without concerning cardiac symptoms on either occasion. Advised him to notify us if he develops concerning symptoms. No bleeding concerns. Continue clopidogrel, metoprolol, Vytorin. As noted above, felt to be at suitable risk for low risk procedure.   Hyperlipidemia LDL goal < 55: LDL 66 on 03/20/22. Discussed the importance of LDL 55 or lower.  He wants to work on better diet and increasing exercise. Currently taking 1/2 of Vytorin 10-80 mg daily. Has annual physical appointment with PCP coming up.  Would favor changing to higher intensity statin if LDL remains > 55.   Hypertension: BP is well controlled today.      Disposition: 1 year with Dr. Eldridge Dace  Medication Adjustments/Labs and Tests Ordered:  Current medicines are reviewed at length with the patient today.  Concerns regarding medicines are outlined  above.  Orders Placed This Encounter  Procedures   EKG 12-Lead   No orders of the defined types were placed in this encounter.   Patient Instructions  Medication Instructions:   Your physician recommends that you continue on your current medications as directed. Please refer to the Current Medication list given to you today.   *If you need a refill on your cardiac medications before your next appointment, please call your pharmacy*   Lab Work:  None ordered.  If you have labs (blood work) drawn today and your tests are completely normal, you will receive your results only by: MyChart Message (if you have MyChart) OR A paper copy in the mail If you have any lab test that is abnormal or we need to change your treatment, we will call you to review the results.   Testing/Procedures:  None order   Follow-Up: At [facility], you and your health needs are our priority.  As part of our continuing mission to provide you with exceptional heart care, we have created designated Provider Care Teams.  These Care Teams include your primary Cardiologist (physician) and Advanced Practice Providers (APPs -  Physician Assistants and Nurse Practitioners) who all work together to provide you with the care you need, when you need it.  We recommend signing up for the patient portal called "MyChart".  Sign up information is provided on this After Visit Summary.  MyChart is used to connect with patients for Virtual Visits (Telemedicine).  Patients are able to view lab/test results, encounter notes, upcoming appointments, etc.  Non-urgent messages can be sent to your provider as well.   To learn more about what you can do with MyChart, go to [email or web address].    Your next appointment:   1 year(s)  Provider:   Lance Muss, MD     Other Instructions Your physician wants you to follow-up in: 1 year with Dr. Eldridge Dace.  You will receive a reminder letter in the mail two months in  advance. If you don't receive a letter, please call our office to schedule the follow-up appointment.      Signed, Levi Aland, NP  09/03/2022 1:11 PM    [facility]

## 2022-09-03 ENCOUNTER — Ambulatory Visit: Payer: Medicare Other | Attending: Nurse Practitioner | Admitting: Nurse Practitioner

## 2022-09-03 ENCOUNTER — Encounter: Payer: Self-pay | Admitting: Nurse Practitioner

## 2022-09-03 VITALS — BP 128/86 | HR 58 | Ht 65.5 in | Wt 174.4 lb

## 2022-09-03 DIAGNOSIS — Z01810 Encounter for preprocedural cardiovascular examination: Secondary | ICD-10-CM | POA: Diagnosis not present

## 2022-09-03 DIAGNOSIS — E785 Hyperlipidemia, unspecified: Secondary | ICD-10-CM | POA: Diagnosis not present

## 2022-09-03 DIAGNOSIS — I2510 Atherosclerotic heart disease of native coronary artery without angina pectoris: Secondary | ICD-10-CM | POA: Diagnosis not present

## 2022-09-03 DIAGNOSIS — I10 Essential (primary) hypertension: Secondary | ICD-10-CM

## 2022-09-03 NOTE — Patient Instructions (Signed)
Medication Instructions:   Your physician recommends that you continue on your current medications as directed. Please refer to the Current Medication list given to you today.   *If you need a refill on your cardiac medications before your next appointment, please call your pharmacy*   Lab Work:  None ordered.  If you have labs (blood work) drawn today and your tests are completely normal, you will receive your results only by: MyChart Message (if you have MyChart) OR A paper copy in the mail If you have any lab test that is abnormal or we need to change your treatment, we will call you to review the results.   Testing/Procedures:  None order   Follow-Up: At [facility], you and your health needs are our priority.  As part of our continuing mission to provide you with exceptional heart care, we have created designated Provider Care Teams.  These Care Teams include your primary Cardiologist (physician) and Advanced Practice Providers (APPs -  Physician Assistants and Nurse Practitioners) who all work together to provide you with the care you need, when you need it.  We recommend signing up for the patient portal called "MyChart".  Sign up information is provided on this After Visit Summary.  MyChart is used to connect with patients for Virtual Visits (Telemedicine).  Patients are able to view lab/test results, encounter notes, upcoming appointments, etc.  Non-urgent messages can be sent to your provider as well.   To learn more about what you can do with MyChart, go to [email or web address].    Your next appointment:   1 year(s)  Provider:   Lance Muss, MD     Other Instructions Your physician wants you to follow-up in: 1 year with Dr. Eldridge Dace.  You will receive a reminder letter in the mail two months in advance. If you don't receive a letter, please call our office to schedule the follow-up appointment.

## 2022-09-18 DIAGNOSIS — Z86010 Personal history of colonic polyps: Secondary | ICD-10-CM | POA: Diagnosis not present

## 2022-09-18 DIAGNOSIS — D122 Benign neoplasm of ascending colon: Secondary | ICD-10-CM | POA: Diagnosis not present

## 2022-09-18 DIAGNOSIS — K644 Residual hemorrhoidal skin tags: Secondary | ICD-10-CM | POA: Diagnosis not present

## 2022-09-18 DIAGNOSIS — K648 Other hemorrhoids: Secondary | ICD-10-CM | POA: Diagnosis not present

## 2022-09-18 DIAGNOSIS — Z09 Encounter for follow-up examination after completed treatment for conditions other than malignant neoplasm: Secondary | ICD-10-CM | POA: Diagnosis not present

## 2022-09-18 DIAGNOSIS — K5730 Diverticulosis of large intestine without perforation or abscess without bleeding: Secondary | ICD-10-CM | POA: Diagnosis not present

## 2022-09-18 LAB — HM COLONOSCOPY

## 2022-09-19 ENCOUNTER — Encounter: Payer: Medicare Other | Admitting: Family Medicine

## 2022-09-20 DIAGNOSIS — D122 Benign neoplasm of ascending colon: Secondary | ICD-10-CM | POA: Diagnosis not present

## 2022-09-25 ENCOUNTER — Encounter: Payer: Medicare Other | Admitting: Family Medicine

## 2022-10-08 DIAGNOSIS — R32 Unspecified urinary incontinence: Secondary | ICD-10-CM | POA: Diagnosis not present

## 2022-10-08 DIAGNOSIS — B009 Herpesviral infection, unspecified: Secondary | ICD-10-CM | POA: Diagnosis not present

## 2022-10-16 ENCOUNTER — Other Ambulatory Visit: Payer: Self-pay | Admitting: Family Medicine

## 2022-10-16 DIAGNOSIS — N401 Enlarged prostate with lower urinary tract symptoms: Secondary | ICD-10-CM

## 2022-10-28 DIAGNOSIS — K080 Exfoliation of teeth due to systemic causes: Secondary | ICD-10-CM | POA: Diagnosis not present

## 2022-10-29 ENCOUNTER — Other Ambulatory Visit: Payer: Self-pay | Admitting: Family Medicine

## 2022-10-29 DIAGNOSIS — E7800 Pure hypercholesterolemia, unspecified: Secondary | ICD-10-CM

## 2022-10-29 DIAGNOSIS — I10 Essential (primary) hypertension: Secondary | ICD-10-CM

## 2022-10-29 DIAGNOSIS — E039 Hypothyroidism, unspecified: Secondary | ICD-10-CM

## 2022-10-29 DIAGNOSIS — N401 Enlarged prostate with lower urinary tract symptoms: Secondary | ICD-10-CM

## 2022-10-29 DIAGNOSIS — M1611 Unilateral primary osteoarthritis, right hip: Secondary | ICD-10-CM

## 2022-11-22 ENCOUNTER — Other Ambulatory Visit: Payer: Self-pay | Admitting: Family Medicine

## 2022-11-22 DIAGNOSIS — N401 Enlarged prostate with lower urinary tract symptoms: Secondary | ICD-10-CM

## 2022-11-28 ENCOUNTER — Ambulatory Visit (INDEPENDENT_AMBULATORY_CARE_PROVIDER_SITE_OTHER): Payer: Medicare Other | Admitting: Family Medicine

## 2022-11-28 ENCOUNTER — Encounter: Payer: Self-pay | Admitting: Family Medicine

## 2022-11-28 VITALS — BP 136/85 | HR 64 | Ht 65.5 in | Wt 174.0 lb

## 2022-11-28 DIAGNOSIS — Z0000 Encounter for general adult medical examination without abnormal findings: Secondary | ICD-10-CM

## 2022-11-28 DIAGNOSIS — Z0001 Encounter for general adult medical examination with abnormal findings: Secondary | ICD-10-CM

## 2022-11-28 DIAGNOSIS — N401 Enlarged prostate with lower urinary tract symptoms: Secondary | ICD-10-CM | POA: Diagnosis not present

## 2022-11-28 DIAGNOSIS — E7800 Pure hypercholesterolemia, unspecified: Secondary | ICD-10-CM

## 2022-11-28 DIAGNOSIS — M1611 Unilateral primary osteoarthritis, right hip: Secondary | ICD-10-CM

## 2022-11-28 DIAGNOSIS — I2510 Atherosclerotic heart disease of native coronary artery without angina pectoris: Secondary | ICD-10-CM | POA: Diagnosis not present

## 2022-11-28 DIAGNOSIS — E039 Hypothyroidism, unspecified: Secondary | ICD-10-CM | POA: Diagnosis not present

## 2022-11-28 DIAGNOSIS — I10 Essential (primary) hypertension: Secondary | ICD-10-CM | POA: Diagnosis not present

## 2022-11-28 DIAGNOSIS — R351 Nocturia: Secondary | ICD-10-CM | POA: Diagnosis not present

## 2022-11-28 MED ORDER — MELOXICAM 15 MG PO TABS
15.0000 mg | ORAL_TABLET | Freq: Every day | ORAL | 3 refills | Status: DC
Start: 2022-11-28 — End: 2024-01-27

## 2022-11-28 MED ORDER — TAMSULOSIN HCL 0.4 MG PO CAPS
0.4000 mg | ORAL_CAPSULE | Freq: Two times a day (BID) | ORAL | 3 refills | Status: DC
Start: 2022-11-28 — End: 2023-11-25

## 2022-11-28 MED ORDER — CLOPIDOGREL BISULFATE 75 MG PO TABS
75.0000 mg | ORAL_TABLET | Freq: Every day | ORAL | 3 refills | Status: DC
Start: 2022-11-28 — End: 2024-01-27

## 2022-11-28 MED ORDER — METOPROLOL TARTRATE 25 MG PO TABS
25.0000 mg | ORAL_TABLET | Freq: Two times a day (BID) | ORAL | 3 refills | Status: DC
Start: 2022-11-28 — End: 2024-01-27

## 2022-11-28 MED ORDER — LEVOTHYROXINE SODIUM 50 MCG PO TABS
50.0000 ug | ORAL_TABLET | Freq: Every day | ORAL | 3 refills | Status: DC
Start: 2022-11-28 — End: 2024-01-27

## 2022-11-28 MED ORDER — EZETIMIBE-SIMVASTATIN 10-80 MG PO TABS
0.5000 | ORAL_TABLET | Freq: Every day | ORAL | 3 refills | Status: DC
Start: 2022-11-28 — End: 2023-09-02

## 2022-11-28 MED ORDER — FINASTERIDE 5 MG PO TABS
5.0000 mg | ORAL_TABLET | Freq: Every day | ORAL | 3 refills | Status: DC
Start: 2022-11-28 — End: 2024-01-27

## 2022-11-28 NOTE — Progress Notes (Signed)
BP 136/85   Pulse 64   Ht 5' 5.5" (1.664 m)   Wt 174 lb (78.9 kg)   SpO2 95%   BMI 28.51 kg/m    Subjective:   Patient ID: Corey Duran, male    DOB: Mar 27, 1951, 72 y.o.   MRN: [id number]  HPI: Corey Duran is a 72 y.o. male presenting on 11/28/2022 for Medical Management of Chronic Issues (CPE), Hyperlipidemia, and Hypothyroidism   HPI Physical exam Patient denies any chest pain, shortness of breath, headaches or vision issues, abdominal complaints, diarrhea, nausea, vomiting, or joint issues.   Hypertension Patient is currently on metoprolol, and their blood pressure today is 136/85. Patient denies any lightheadedness or dizziness. Patient denies headaches, blurred vision, chest pains, shortness of breath, or weakness. Denies any side effects from medication and is content with current medication.   Hypothyroidism recheck Patient is coming in for thyroid recheck today as well. They deny any issues with hair changes or heat or cold problems or diarrhea or constipation. They deny any chest pain or palpitations. They are currently on levothyroxine 50 micrograms   Hyperlipidemia and CAD Patient is coming in for recheck of his hyperlipidemia. The patient is currently taking fish oils and simvastatin and Zetia. They deny any issues with myalgias or history of liver damage from it. They deny any focal numbness or weakness or chest pain.   BPH he has been seeing the urologist and was sent to a specialist. Patient is coming in for recheck on BPH Symptoms: Nocturia and frequency Medication: Tamsulosin and finasteride Last PSA: Just over a year ago at specialist.  Relevant past medical, surgical, family and social history reviewed and updated as indicated. Interim medical history since our last visit reviewed. Allergies and medications reviewed and updated.  Review of Systems  Constitutional:  Negative for chills and fever.  HENT:  Negative for ear pain and tinnitus.   Eyes:   Negative for pain and visual disturbance.  Respiratory:  Negative for cough, shortness of breath and wheezing.   Cardiovascular:  Negative for chest pain, palpitations and leg swelling.  Gastrointestinal:  Negative for abdominal pain, blood in stool, constipation and diarrhea.  Genitourinary:  Negative for dysuria and hematuria.  Musculoskeletal:  Negative for back pain, gait problem and myalgias.  Skin:  Negative for rash.  Neurological:  Negative for dizziness, weakness, light-headedness and headaches.  Psychiatric/Behavioral:  Negative for suicidal ideas.   All other systems reviewed and are negative.   Per HPI unless specifically indicated above   Allergies as of 11/28/2022   No Known Allergies      Medication List        Accurate as of November 28, 2022  1:43 PM. If you have any questions, ask your nurse or doctor.          acetaminophen 500 MG tablet Commonly known as: TYLENOL Take 1 tablet (500 mg total) by mouth every 6 (six) hours as needed.   BEE POLLEN PO Take 1-2 capsules by mouth See admin instructions. Take 2 capsule by mouth in the morning and 1 capsule in the evening   clopidogrel 75 MG tablet Commonly known as: PLAVIX Take 1 tablet (75 mg total) by mouth daily. What changed:  how much to take how to take this when to take this additional instructions Changed by: Elige Radon Sabrine Patchen   CoQ10 100 MG Caps Take 100 mg by mouth daily.   econazole nitrate 1 % cream Apply topically 2 (two) times  daily. To affected areas   ezetimibe-simvastatin 10-80 MG tablet Commonly known as: VYTORIN Take 0.5 tablets by mouth daily.   finasteride 5 MG tablet Commonly known as: PROSCAR Take 1 tablet (5 mg total) by mouth daily. What changed:  how much to take how to take this when to take this additional instructions Changed by: Elige Radon Jeani Fassnacht   Fish Oil 1000 MG Caps Take 1,000 mg by mouth every evening.   fluconazole 100 MG tablet Commonly known as:  Diflucan Take two with first dose. Then starting the next day take one daily until all are taken.   fluticasone 50 MCG/ACT nasal spray Commonly known as: FLONASE Place 1 spray into both nostrils 2 (two) times daily as needed for allergies or rhinitis.   levothyroxine 50 MCG tablet Commonly known as: SYNTHROID Take 1 tablet (50 mcg total) by mouth daily before breakfast. What changed: See the new instructions. Changed by: Elige Radon Sheyann Sulton   meloxicam 15 MG tablet Commonly known as: MOBIC Take 1 tablet (15 mg total) by mouth daily. What changed: See the new instructions. Changed by: Elige Radon Tay Whitwell   metoprolol tartrate 25 MG tablet Commonly known as: LOPRESSOR Take 1 tablet (25 mg total) by mouth 2 (two) times daily. What changed:  how much to take how to take this when to take this additional instructions Changed by: Elige Radon [place]ara Acklin   MOVE FREE PO Take 1 tablet by mouth daily.   MULTIVITAMIN PO Take 1 tablet by mouth daily.   nitroGLYCERIN 0.4 MG SL tablet Commonly known as: NITROSTAT Place 1 tablet (0.4 mg total) under the tongue every 5 (five) minutes as needed for chest pain.   OVER THE COUNTER MEDICATION Take 3 capsules by mouth every morning. Prostagenix   sodium chloride 0.65 % Soln nasal spray Commonly known as: OCEAN Place 1 spray into both nostrils as needed for congestion.   tamsulosin 0.4 MG Caps capsule Commonly known as: FLOMAX Take 0.4 mg by mouth in the morning and at bedtime. What changed: Another medication with the same name was removed. Continue taking this medication, and follow the directions you see here. Changed by: Elige Radon Omayra Tulloch   TURMERIC CURCUMIN PO Take 1,000 mg by mouth daily.   UNABLE TO FIND Med Name: Move Free supp OTC - one a day   valACYclovir 500 MG tablet Commonly known as: VALTREX Take 1 tablet (500 mg total) by mouth daily as needed (outbreaks).   Vitamin D 50 MCG (2000 UT) Caps Take 2,000 Units by mouth  daily.   Zinc 50 MG Caps Take 50 mg by mouth daily.         Objective:   BP 136/85   Pulse 64   Ht 5' 5.5" (1.664 m)   Wt 174 lb (78.9 kg)   SpO2 95%   BMI 28.51 kg/m   Wt Readings from Last 3 Encounters:  11/28/22 174 lb (78.9 kg)  09/03/22 174 lb 6.4 oz (79.1 kg)  07/17/22 178 lb (80.7 kg)    Physical Exam Vitals reviewed.  Constitutional:      General: He is not in acute distress.    Appearance: He is well-developed. He is not diaphoretic.  HENT:     Right Ear: External ear normal.     Left Ear: External ear normal.     Nose: Nose normal.     Mouth/Throat:     Pharynx: No oropharyngeal exudate.  Eyes:     General: No scleral icterus.  Right eye: No discharge.     Conjunctiva/sclera: Conjunctivae normal.     Pupils: Pupils are equal, round, and reactive to light.  Neck:     Thyroid: No thyromegaly.  Cardiovascular:     Rate and Rhythm: Normal rate and regular rhythm.     Heart sounds: Normal heart sounds. No murmur heard. Pulmonary:     Effort: Pulmonary effort is normal. No respiratory distress.     Breath sounds: Normal breath sounds. No wheezing.  Abdominal:     General: Bowel sounds are normal. There is no distension.     Palpations: Abdomen is soft.     Tenderness: There is no abdominal tenderness. There is no guarding or rebound.  Musculoskeletal:        General: No swelling. Normal range of motion.     Cervical back: Neck supple.  Lymphadenopathy:     Cervical: No cervical adenopathy.  Skin:    General: Skin is warm and dry.     Findings: No rash.  Neurological:     Mental Status: He is alert and oriented to person, place, and time.     Coordination: Coordination normal.  Psychiatric:        Behavior: Behavior normal.       Assessment & Plan:   Problem List Items Addressed This Visit       Cardiovascular and Mediastinum   Essential hypertension, benign   Relevant Medications   clopidogrel (PLAVIX) 75 MG tablet    ezetimibe-simvastatin (VYTORIN) 10-80 MG tablet   metoprolol tartrate (LOPRESSOR) 25 MG tablet   Other Relevant Orders   CBC with Differential/Platelet   CMP14+EGFR   VITAMIN D 25 Hydroxy (Vit-D Deficiency, Fractures)   Coronary atherosclerosis of native coronary artery   Relevant Medications   ezetimibe-simvastatin (VYTORIN) 10-80 MG tablet   metoprolol tartrate (LOPRESSOR) 25 MG tablet   Other Relevant Orders   VITAMIN D 25 Hydroxy (Vit-D Deficiency, Fractures)     Endocrine   Hypothyroidism   Relevant Medications   levothyroxine (SYNTHROID) 50 MCG tablet   metoprolol tartrate (LOPRESSOR) 25 MG tablet   Other Relevant Orders   VITAMIN D 25 Hydroxy (Vit-D Deficiency, Fractures)   Thyroid Panel With TSH     Musculoskeletal and Integument   Osteoarthritis of right hip   Relevant Medications   meloxicam (MOBIC) 15 MG tablet   Other Relevant Orders   VITAMIN D 25 Hydroxy (Vit-D Deficiency, Fractures)     Other   Hypercholesteremia   Relevant Medications   ezetimibe-simvastatin (VYTORIN) 10-80 MG tablet   metoprolol tartrate (LOPRESSOR) 25 MG tablet   Other Relevant Orders   Lipid panel   VITAMIN D 25 Hydroxy (Vit-D Deficiency, Fractures)   Other Visit Diagnoses     Physical exam    -  Primary   Relevant Orders   CBC with Differential/Platelet   CMP14+EGFR   Lipid panel   PSA, total and free   VITAMIN D 25 Hydroxy (Vit-D Deficiency, Fractures)   Benign prostatic hyperplasia with nocturia       Relevant Medications   finasteride (PROSCAR) 5 MG tablet   Other Relevant Orders   PSA, total and free   VITAMIN D 25 Hydroxy (Vit-D Deficiency, Fractures)       Will do blood work today.  He is slotted to go see a specialist for his prostate, saw urologist at [facility] and they sent him to a different specialist  He says besides urinary issues he is doing pretty well.  Will  see what the blood work shows Follow up plan: Return if symptoms worsen or fail to improve, for  Hypertension and hyperlipidemia recheck.  Counseling provided for all of the vaccine components Orders Placed This Encounter  Procedures   CBC with Differential/Platelet   CMP14+EGFR   Lipid panel   PSA, total and free   VITAMIN D 25 Hydroxy (Vit-D Deficiency, Fractures)   Thyroid Panel With TSH    Arville Care, MD [facility] 11/28/2022, 1:43 PM

## 2022-11-29 LAB — CBC WITH DIFFERENTIAL/PLATELET
Basophils Absolute: 0 10*3/uL (ref 0.0–0.2)
Basos: 0 %
EOS (ABSOLUTE): 0.1 10*3/uL (ref 0.0–0.4)
Eos: 2 %
Hematocrit: 45.7 % (ref 37.5–51.0)
Hemoglobin: 15.4 g/dL (ref 13.0–17.7)
Immature Grans (Abs): 0 10*3/uL (ref 0.0–0.1)
Immature Granulocytes: 0 %
Lymphocytes Absolute: 1.3 10*3/uL (ref 0.7–3.1)
Lymphs: 23 %
MCH: 31.4 pg (ref 26.6–33.0)
MCHC: 33.7 g/dL (ref 31.5–35.7)
MCV: 93 fL (ref 79–97)
Monocytes Absolute: 0.3 10*3/uL (ref 0.1–0.9)
Monocytes: 6 %
Neutrophils Absolute: 3.9 10*3/uL (ref 1.4–7.0)
Neutrophils: 69 %
Platelets: 173 10*3/uL (ref 150–450)
RBC: 4.91 x10E6/uL (ref 4.14–5.80)
RDW: 12.1 % (ref 11.6–15.4)
WBC: 5.7 10*3/uL (ref 3.4–10.8)

## 2022-11-29 LAB — LIPID PANEL
Chol/HDL Ratio: 3.6 ratio (ref 0.0–5.0)
Cholesterol, Total: 156 mg/dL (ref 100–199)
HDL: 43 mg/dL (ref >39)
LDL Chol Calc (NIH): 87 mg/dL (ref 0–99)
Triglycerides: 146 mg/dL (ref 0–149)
VLDL Cholesterol Cal: 26 mg/dL (ref 5–40)

## 2022-11-29 LAB — CMP14+EGFR
ALT: 22 IU/L (ref 0–44)
AST: 21 IU/L (ref 0–40)
Albumin: 4.1 g/dL (ref 3.8–4.8)
Alkaline Phosphatase: 58 IU/L (ref 44–121)
BUN/Creatinine Ratio: 24 (ref 10–24)
BUN: 23 mg/dL (ref 8–27)
Bilirubin Total: 0.8 mg/dL (ref 0.0–1.2)
CO2: 25 mmol/L (ref 20–29)
Calcium: 9.2 mg/dL (ref 8.6–10.2)
Chloride: 106 mmol/L (ref 96–106)
Creatinine, Ser: 0.96 mg/dL (ref 0.76–1.27)
Globulin, Total: 2.3 g/dL (ref 1.5–4.5)
Glucose: 91 mg/dL (ref 70–99)
Potassium: 3.7 mmol/L (ref 3.5–5.2)
Sodium: 141 mmol/L (ref 134–144)
Total Protein: 6.4 g/dL (ref 6.0–8.5)
eGFR: 83 mL/min/{1.73_m2} (ref >59)

## 2022-11-29 LAB — PSA, TOTAL AND FREE
PSA, Free Pct: 30.3 %
PSA, Free: 0.88 ng/mL
Prostate Specific Ag, Serum: 2.9 ng/mL (ref 0.0–4.0)

## 2022-11-29 LAB — THYROID PANEL WITH TSH
Free Thyroxine Index: 1.7 (ref 1.2–4.9)
T3 Uptake Ratio: 28 % (ref 24–39)
T4, Total: 6.1 ug/dL (ref 4.5–12.0)
TSH: 3.15 u[IU]/mL (ref 0.450–4.500)

## 2022-11-29 LAB — VITAMIN D 25 HYDROXY (VIT D DEFICIENCY, FRACTURES): Vit D, 25-Hydroxy: 56.1 ng/mL (ref 30.0–100.0)

## 2022-12-11 DIAGNOSIS — H40013 Open angle with borderline findings, low risk, bilateral: Secondary | ICD-10-CM | POA: Diagnosis not present

## 2022-12-11 DIAGNOSIS — H25813 Combined forms of age-related cataract, bilateral: Secondary | ICD-10-CM | POA: Diagnosis not present

## 2023-01-28 DIAGNOSIS — H25813 Combined forms of age-related cataract, bilateral: Secondary | ICD-10-CM | POA: Diagnosis not present

## 2023-02-13 DIAGNOSIS — I10 Essential (primary) hypertension: Secondary | ICD-10-CM | POA: Diagnosis not present

## 2023-02-13 DIAGNOSIS — H25811 Combined forms of age-related cataract, right eye: Secondary | ICD-10-CM | POA: Diagnosis not present

## 2023-02-14 DIAGNOSIS — H25812 Combined forms of age-related cataract, left eye: Secondary | ICD-10-CM | POA: Diagnosis not present

## 2023-02-14 DIAGNOSIS — H40013 Open angle with borderline findings, low risk, bilateral: Secondary | ICD-10-CM | POA: Diagnosis not present

## 2023-02-14 DIAGNOSIS — Z9841 Cataract extraction status, right eye: Secondary | ICD-10-CM | POA: Diagnosis not present

## 2023-02-14 DIAGNOSIS — Z961 Presence of intraocular lens: Secondary | ICD-10-CM | POA: Diagnosis not present

## 2023-02-21 DIAGNOSIS — Z9841 Cataract extraction status, right eye: Secondary | ICD-10-CM | POA: Diagnosis not present

## 2023-02-21 DIAGNOSIS — H25812 Combined forms of age-related cataract, left eye: Secondary | ICD-10-CM | POA: Diagnosis not present

## 2023-02-21 DIAGNOSIS — H40013 Open angle with borderline findings, low risk, bilateral: Secondary | ICD-10-CM | POA: Diagnosis not present

## 2023-02-21 DIAGNOSIS — Z961 Presence of intraocular lens: Secondary | ICD-10-CM | POA: Diagnosis not present

## 2023-03-10 IMAGING — RF DG C-ARM 1-60 MIN-NO REPORT
1 series · 7 of 7 positions shown · non-contrast
Comparison: 08/13/2018

CLINICAL DATA: Right hip arthroplasty

EXAM:
OPERATIVE RIGHT HIP (WITH PELVIS IF PERFORMED) INTRAOPERATIVE VIEWS
TECHNIQUE: Fluoroscopic spot image(s) were submitted for interpretation
post-operatively.

[Series 1: unknown protocol · 0.20mm/px · 7 of 7 slices shown]
[im 1/7]
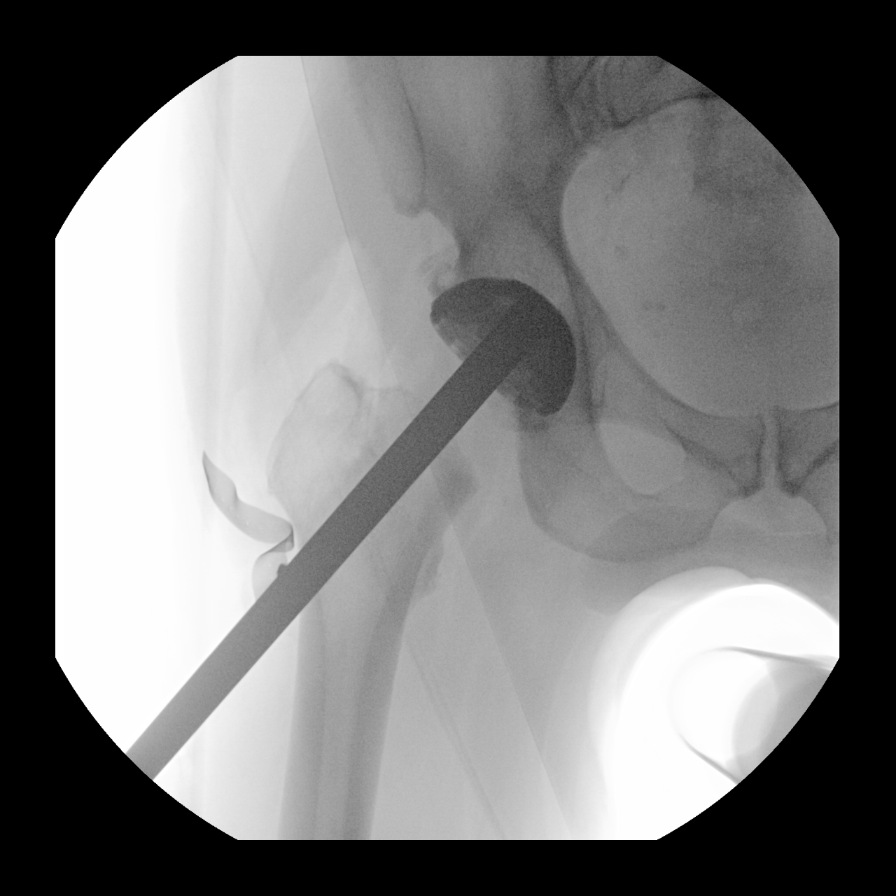
[im 2/7]
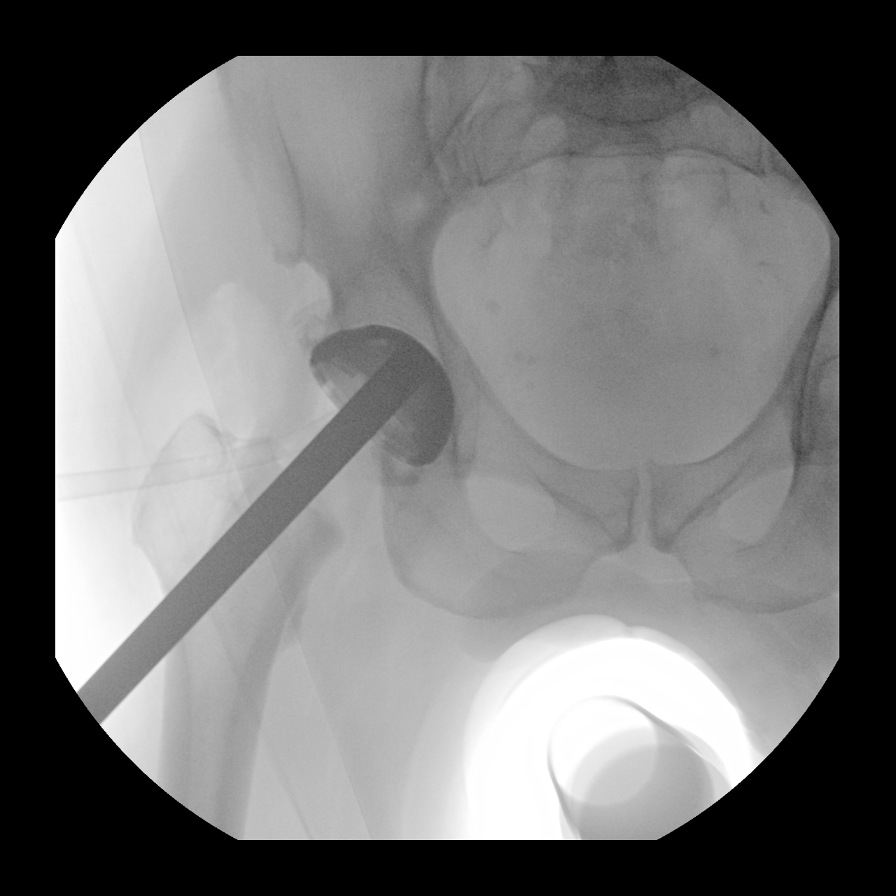
[im 3/7]
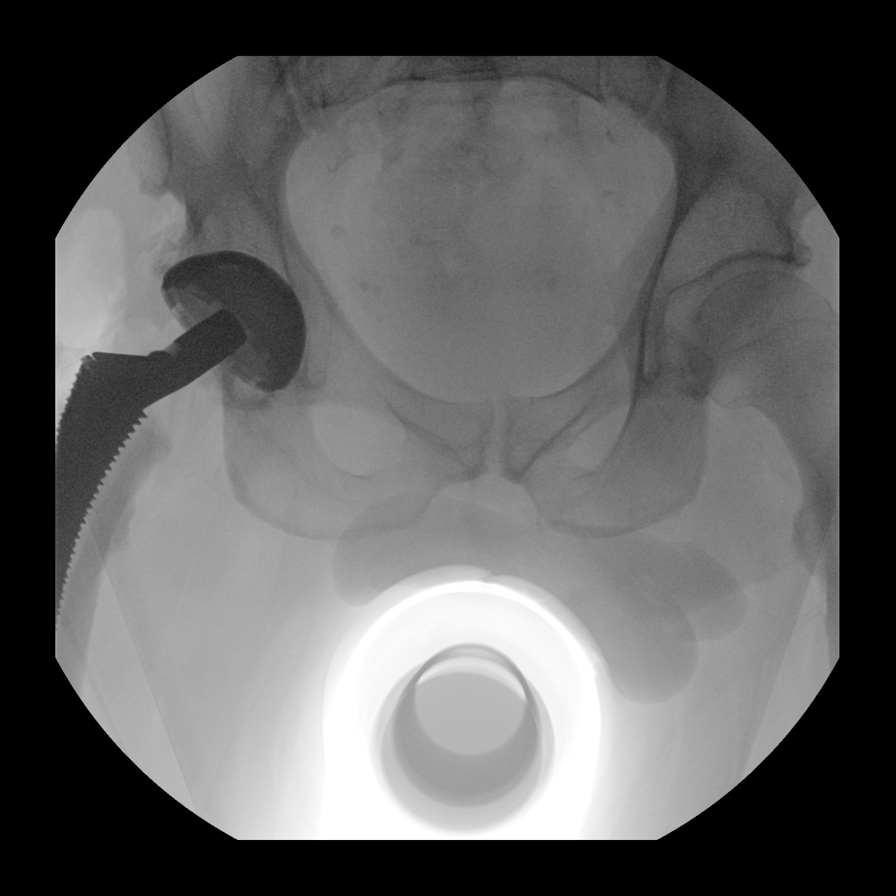
[im 4/7]
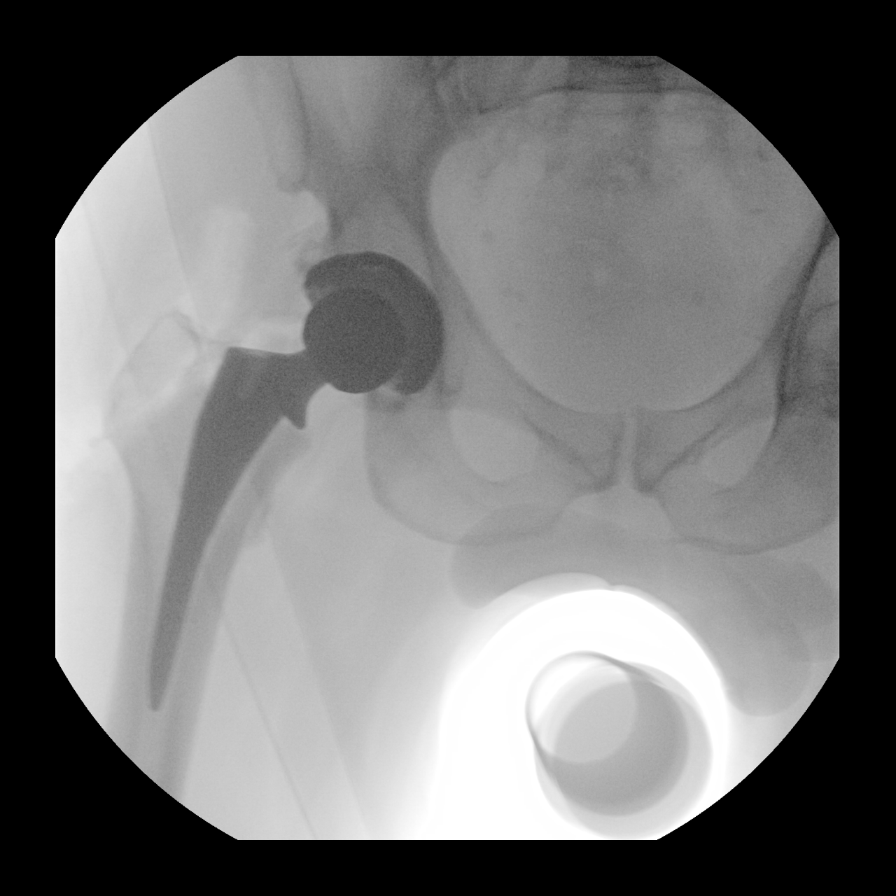
[im 5/7]
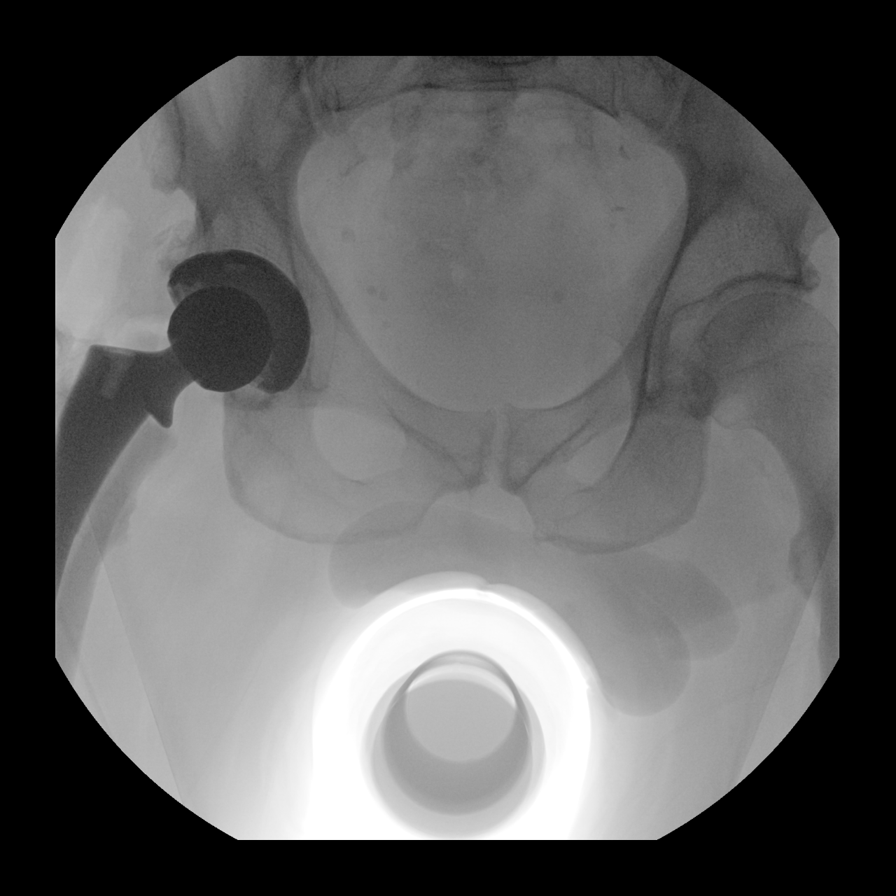
[im 6/7]
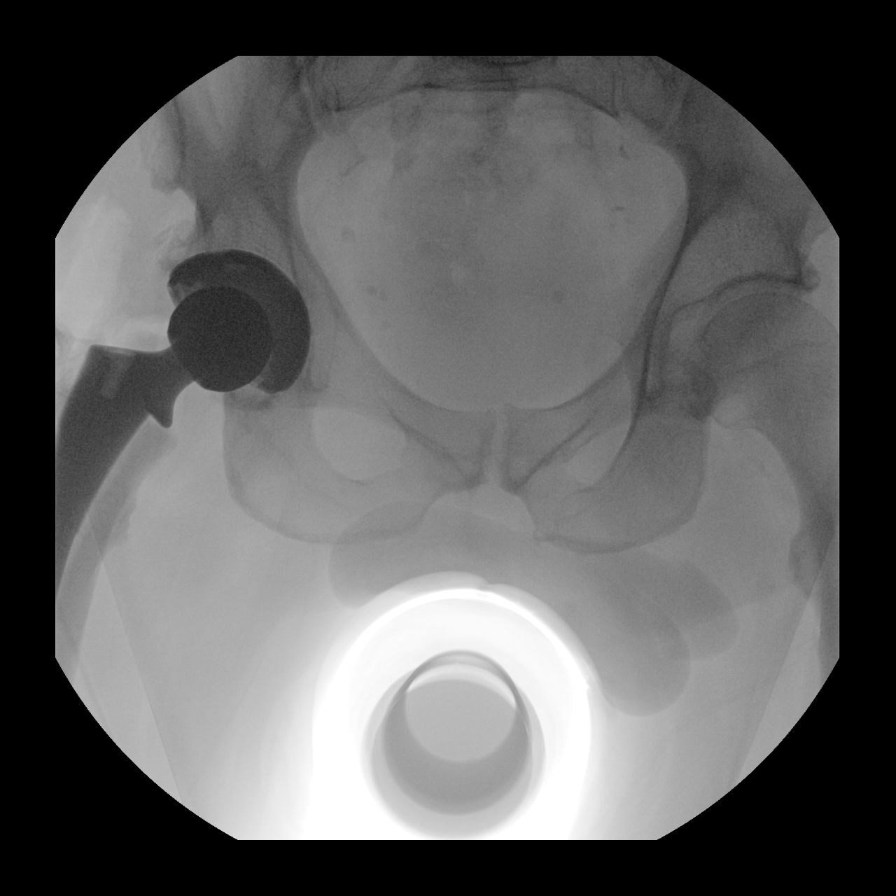
[im 7/7]
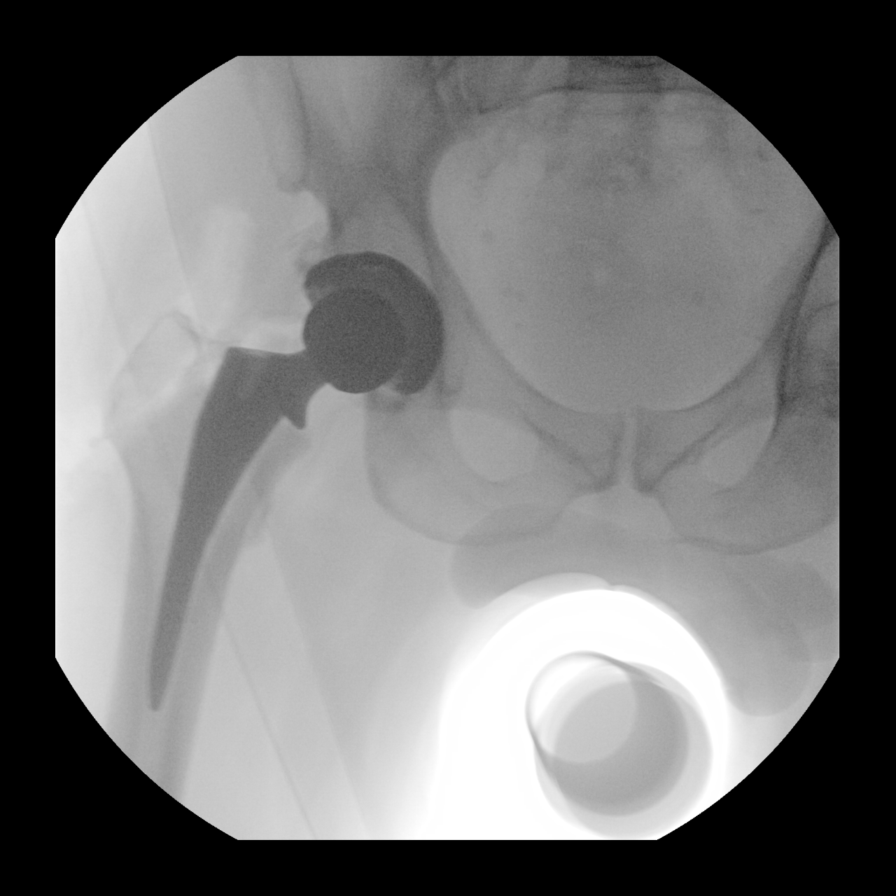

[7 of 7 positions shown; findings below may reference images not displayed]

FINDINGS: 6 C-arm fluoroscopic images were obtained intraoperatively and
submitted for post operative interpretation. Images demonstrate
placement of right total hip arthroplasty hardware. Arthroplasty
components appear within their expected alignment on final image. No
obvious intraoperative complication. 9 seconds of fluoroscopy time
was utilized. Please see the performing provider's procedural report
for further detail.
IMPRESSION: As above.

## 2023-03-10 IMAGING — DX DG PORTABLE PELVIS
1 series · 1 of 1 positions shown · non-contrast
Comparison: Same [AGE] hours.

CLINICAL DATA: Postop hip surgery.

EXAM:
PORTABLE PELVIS 1-2 VIEWS

[pelvis ap]
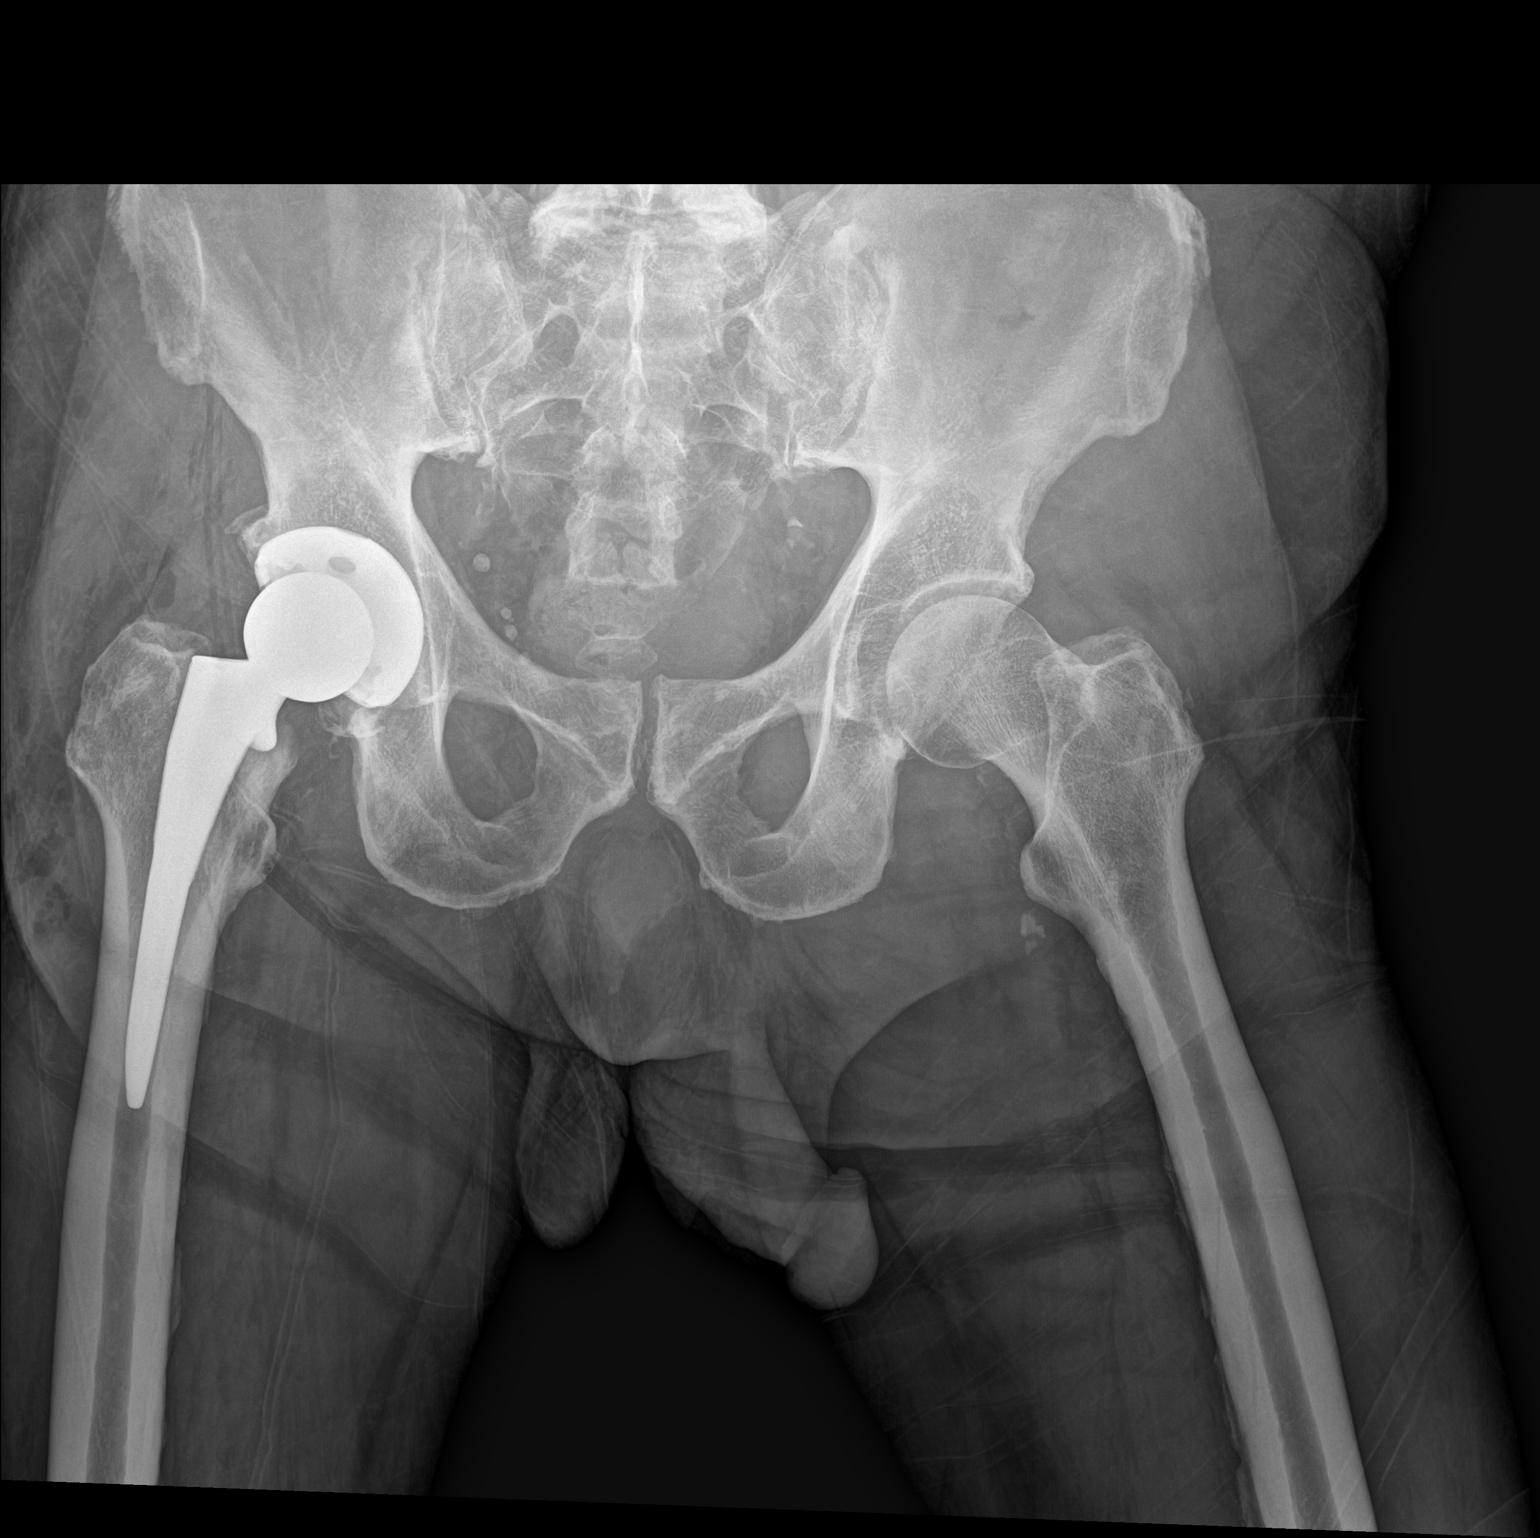

[1 of 1 positions shown; findings below may reference images not displayed]

FINDINGS: Right total hip arthroplasty. Subcutaneous and joint air is present.
Left hip is unremarkable.
IMPRESSION: Right total hip arthroplasty with expected postoperative findings.

## 2023-03-10 IMAGING — RF DG HIP (WITH PELVIS) OPERATIVE*R*
1 series · 7 of 7 positions shown · non-contrast
Comparison: 08/13/2018

CLINICAL DATA: Right hip arthroplasty

EXAM:
OPERATIVE RIGHT HIP (WITH PELVIS IF PERFORMED) INTRAOPERATIVE VIEWS
TECHNIQUE: Fluoroscopic spot image(s) were submitted for interpretation
post-operatively.

[Series 1: unknown protocol · 0.20mm/px · 7 of 7 slices shown]
[im 1/7]
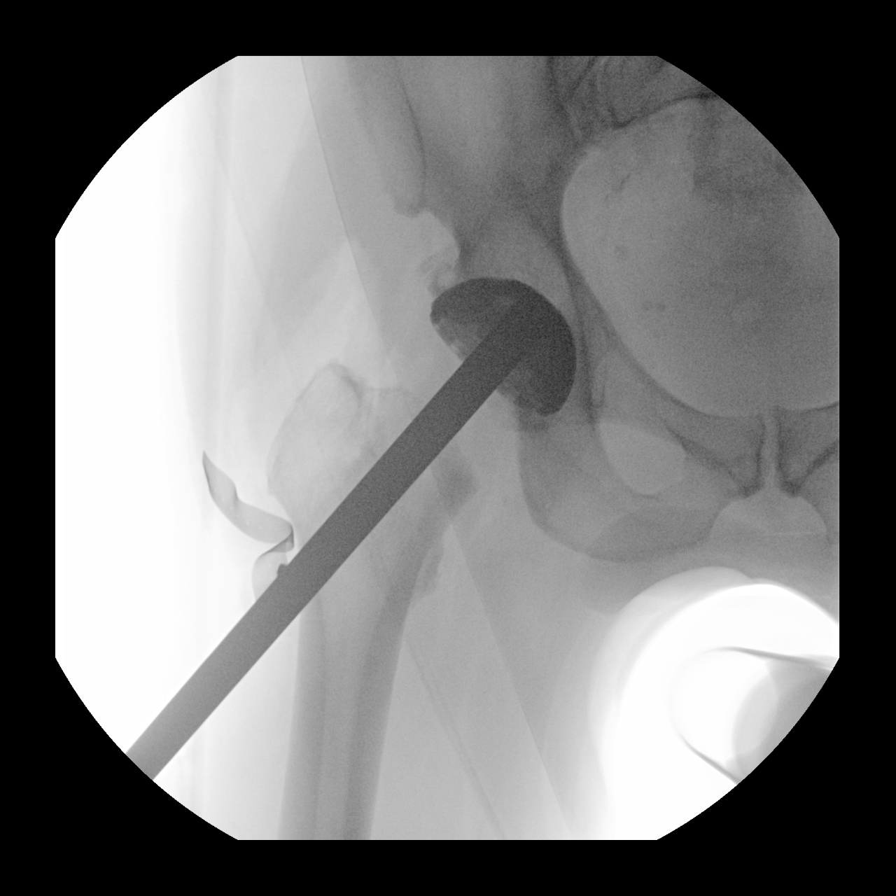
[im 2/7]
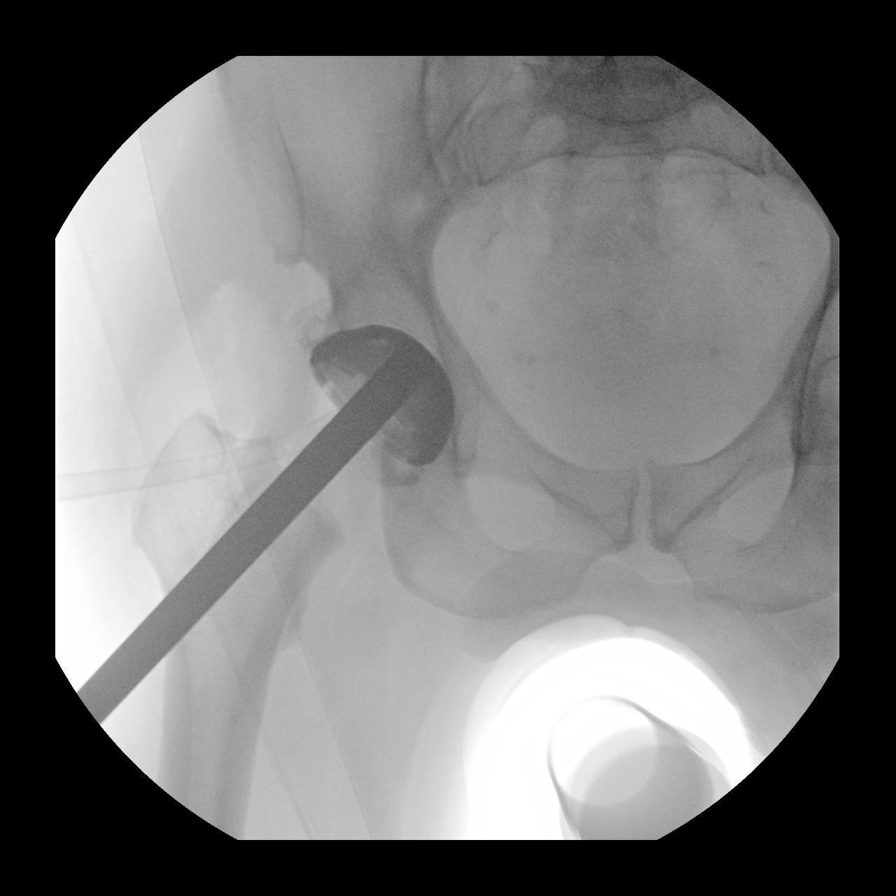
[im 3/7]
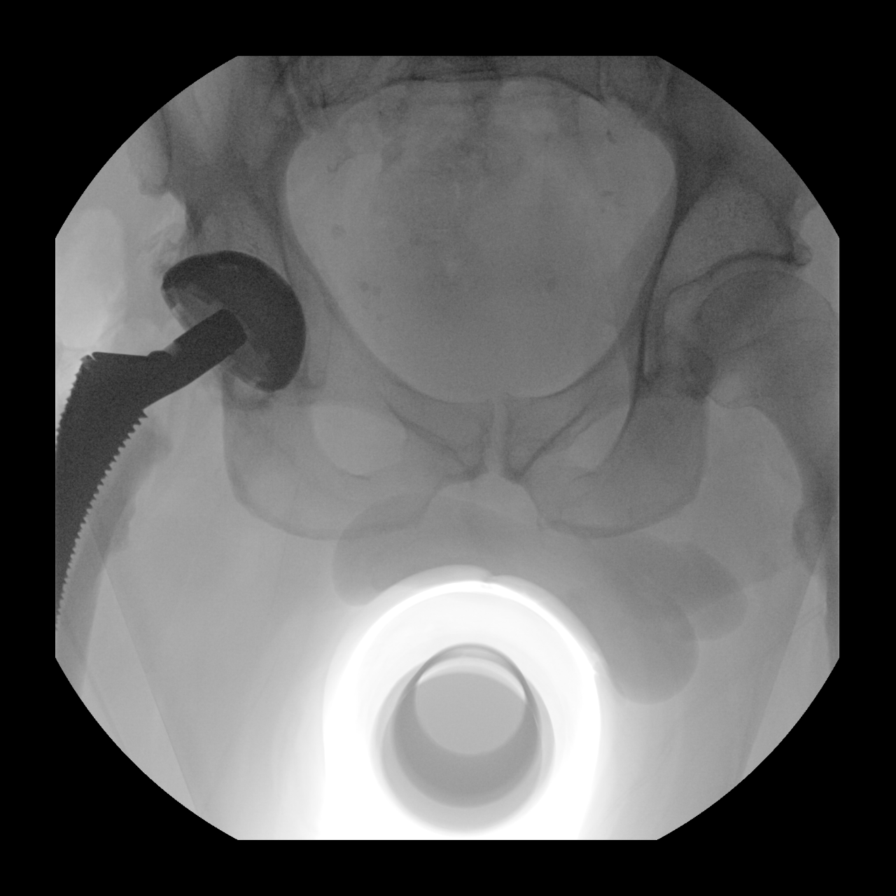
[im 4/7]
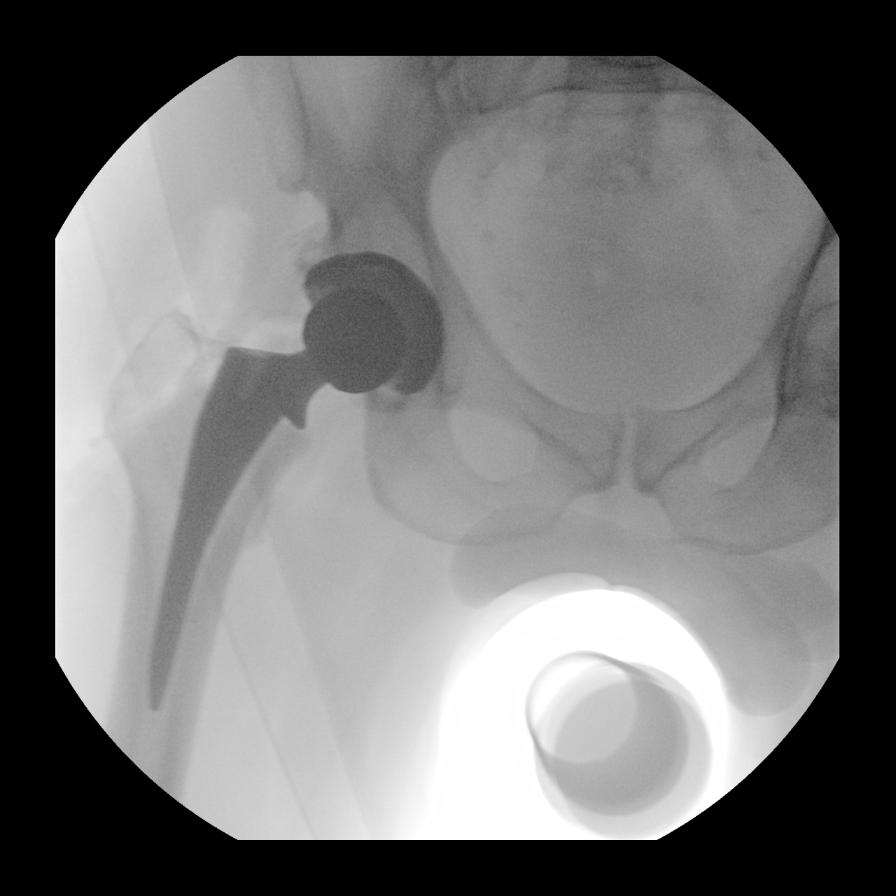
[im 5/7]
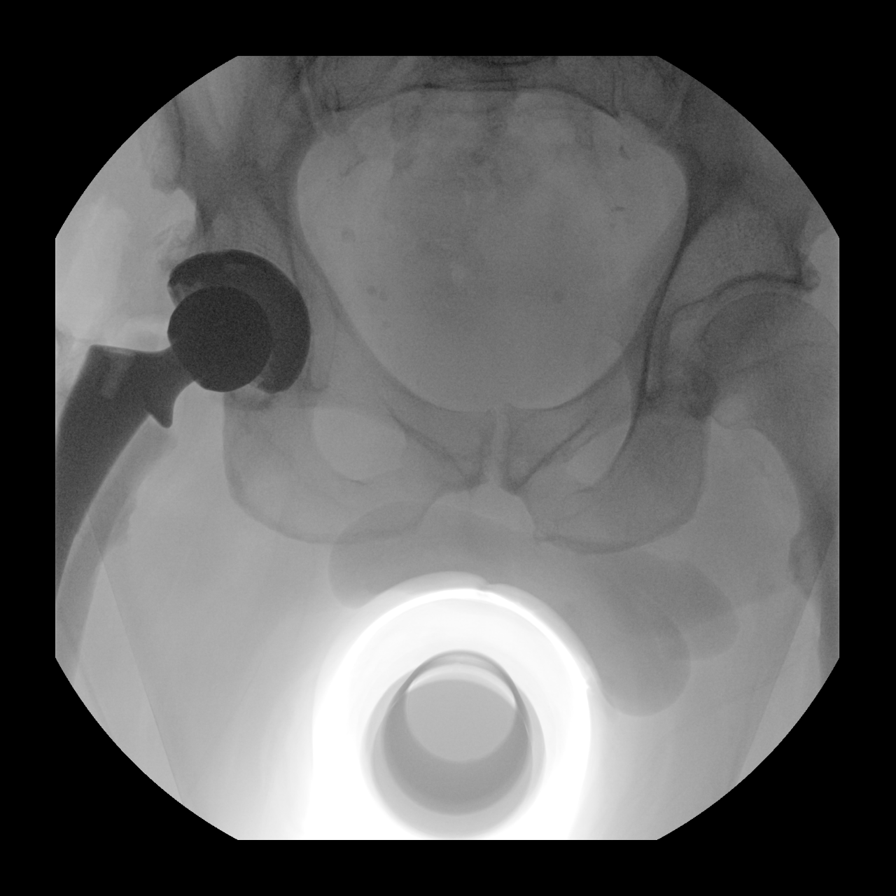
[im 6/7]
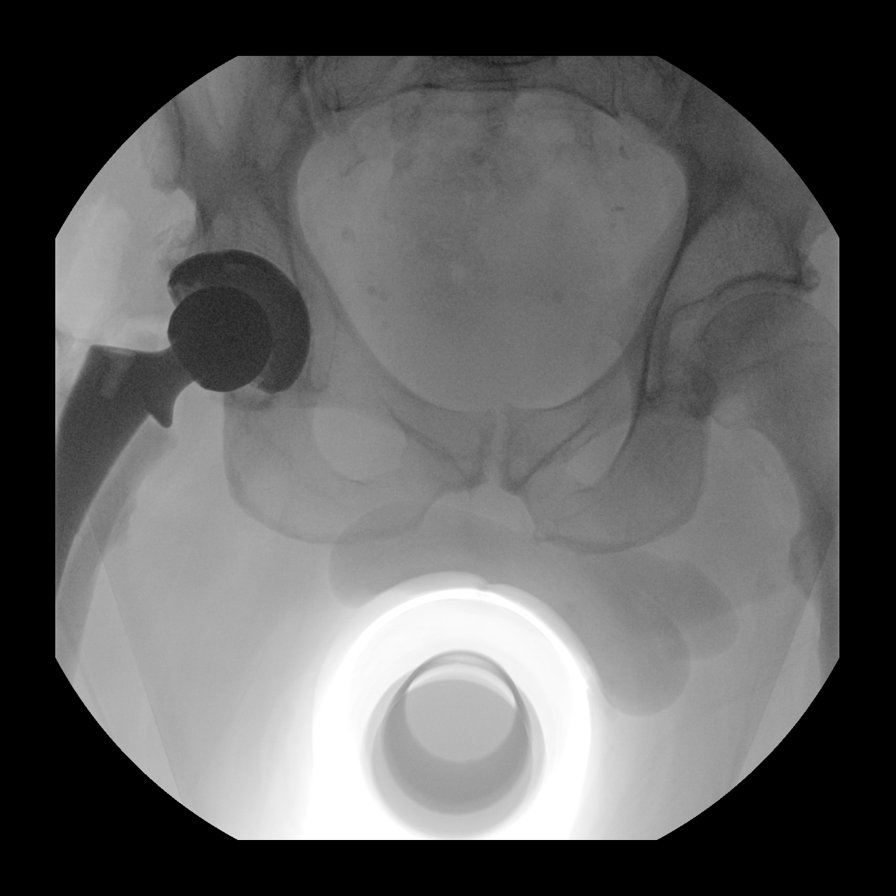
[im 7/7]
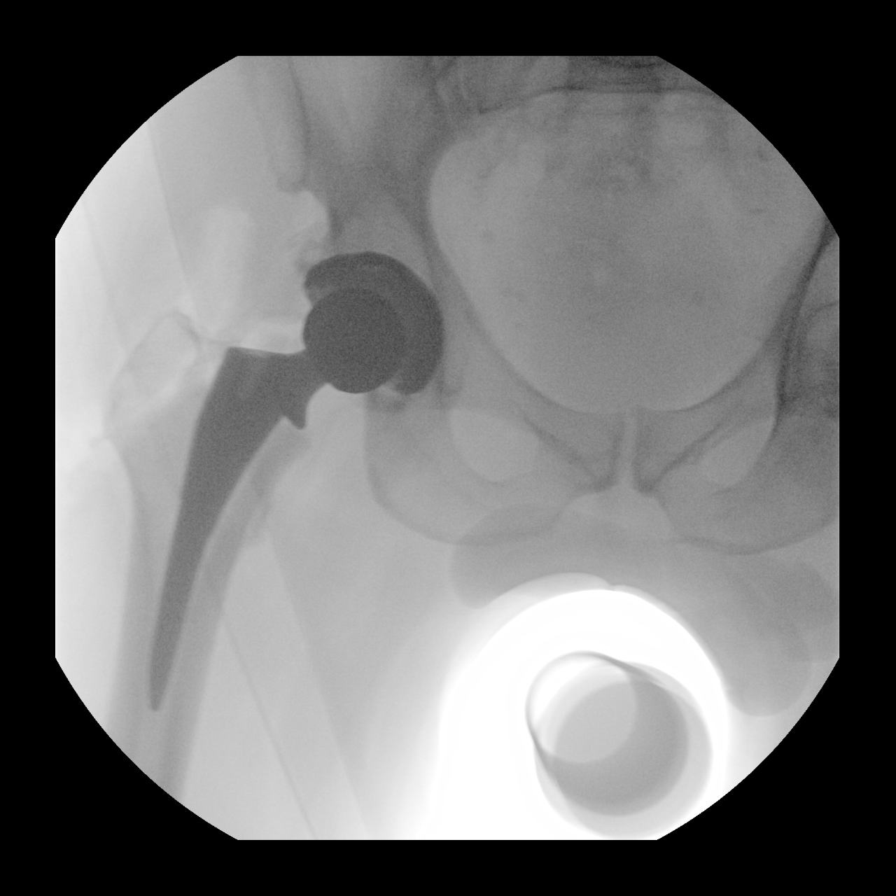

[7 of 7 positions shown; findings below may reference images not displayed]

FINDINGS: 6 C-arm fluoroscopic images were obtained intraoperatively and
submitted for post operative interpretation. Images demonstrate
placement of right total hip arthroplasty hardware. Arthroplasty
components appear within their expected alignment on final image. No
obvious intraoperative complication. 9 seconds of fluoroscopy time
was utilized. Please see the performing provider's procedural report
for further detail.
IMPRESSION: As above.

## 2023-03-13 DIAGNOSIS — H25812 Combined forms of age-related cataract, left eye: Secondary | ICD-10-CM | POA: Diagnosis not present

## 2023-03-21 DIAGNOSIS — Z9841 Cataract extraction status, right eye: Secondary | ICD-10-CM | POA: Diagnosis not present

## 2023-03-21 DIAGNOSIS — Z9842 Cataract extraction status, left eye: Secondary | ICD-10-CM | POA: Diagnosis not present

## 2023-03-21 DIAGNOSIS — H40013 Open angle with borderline findings, low risk, bilateral: Secondary | ICD-10-CM | POA: Diagnosis not present

## 2023-03-21 DIAGNOSIS — Z961 Presence of intraocular lens: Secondary | ICD-10-CM | POA: Diagnosis not present

## 2023-03-24 DIAGNOSIS — L814 Other melanin hyperpigmentation: Secondary | ICD-10-CM | POA: Diagnosis not present

## 2023-03-24 DIAGNOSIS — D485 Neoplasm of uncertain behavior of skin: Secondary | ICD-10-CM | POA: Diagnosis not present

## 2023-03-24 DIAGNOSIS — L570 Actinic keratosis: Secondary | ICD-10-CM | POA: Diagnosis not present

## 2023-03-24 DIAGNOSIS — L821 Other seborrheic keratosis: Secondary | ICD-10-CM | POA: Diagnosis not present

## 2023-03-24 DIAGNOSIS — L820 Inflamed seborrheic keratosis: Secondary | ICD-10-CM | POA: Diagnosis not present

## 2023-03-24 DIAGNOSIS — D0461 Carcinoma in situ of skin of right upper limb, including shoulder: Secondary | ICD-10-CM | POA: Diagnosis not present

## 2023-03-24 DIAGNOSIS — Z85828 Personal history of other malignant neoplasm of skin: Secondary | ICD-10-CM | POA: Diagnosis not present

## 2023-04-28 DIAGNOSIS — H40013 Open angle with borderline findings, low risk, bilateral: Secondary | ICD-10-CM | POA: Diagnosis not present

## 2023-04-28 DIAGNOSIS — Z9841 Cataract extraction status, right eye: Secondary | ICD-10-CM | POA: Diagnosis not present

## 2023-04-28 DIAGNOSIS — Z961 Presence of intraocular lens: Secondary | ICD-10-CM | POA: Diagnosis not present

## 2023-04-28 DIAGNOSIS — Z9842 Cataract extraction status, left eye: Secondary | ICD-10-CM | POA: Diagnosis not present

## 2023-05-05 DIAGNOSIS — K080 Exfoliation of teeth due to systemic causes: Secondary | ICD-10-CM | POA: Diagnosis not present

## 2023-06-02 ENCOUNTER — Encounter: Payer: Self-pay | Admitting: Family Medicine

## 2023-06-02 ENCOUNTER — Ambulatory Visit (INDEPENDENT_AMBULATORY_CARE_PROVIDER_SITE_OTHER): Payer: Medicare Other | Admitting: Family Medicine

## 2023-06-02 VITALS — BP 135/74 | HR 58 | Ht 65.5 in | Wt 177.0 lb

## 2023-06-02 DIAGNOSIS — I10 Essential (primary) hypertension: Secondary | ICD-10-CM

## 2023-06-02 DIAGNOSIS — I2510 Atherosclerotic heart disease of native coronary artery without angina pectoris: Secondary | ICD-10-CM | POA: Diagnosis not present

## 2023-06-02 DIAGNOSIS — E7800 Pure hypercholesterolemia, unspecified: Secondary | ICD-10-CM

## 2023-06-02 DIAGNOSIS — E039 Hypothyroidism, unspecified: Secondary | ICD-10-CM | POA: Diagnosis not present

## 2023-06-02 NOTE — Progress Notes (Signed)
 BP 135/74   Pulse (!) 58   Ht 5' 5.5" (1.664 m)   Wt 177 lb (80.3 kg)   SpO2 95%   BMI 29.01 kg/m    Subjective:   Patient ID: Corey Duran, male    DOB: 03/23/1951, 73 y.o.   MRN: [id number]  HPI: Corey Duran is a 73 y.o. male presenting on 06/02/2023 for Medical Management of Chronic Issues, Hypothyroidism, and Hyperlipidemia   HPI Hypertension Patient is currently on metoprolol, and their blood pressure today is 134/74. Patient denies any lightheadedness or dizziness. Patient denies headaches, blurred vision, chest pains, shortness of breath, or weakness. Denies any side effects from medication and is content with current medication.   Hyperlipidemia and CAD Patient is coming in for recheck of his hyperlipidemia. The patient is currently taking Vytorin and fish oils. They deny any issues with myalgias or history of liver damage from it. They deny any focal numbness or weakness or chest pain.   Hypothyroidism recheck Patient is coming in for thyroid recheck today as well. They deny any issues with hair changes or heat or cold problems or diarrhea or constipation. They deny any chest pain or palpitations. They are currently on levothyroxine 50 micrograms   Patient started having some cough and congestion just starting overnight last night and into today.  He denies any fevers or chills or bodyaches or shortness of breath or wheezing.  He has been using some Flonase last night but had not used anything else at this point.  He denies any sick contacts that he knows of.  Relevant past medical, surgical, family and social history reviewed and updated as indicated. Interim medical history since our last visit reviewed. Allergies and medications reviewed and updated.  Review of Systems  Constitutional:  Negative for chills and fever.  HENT:  Positive for congestion, postnasal drip and sore throat. Negative for ear discharge, ear pain, rhinorrhea, sinus pressure, sneezing and voice  change.   Eyes:  Negative for pain, discharge, redness and visual disturbance.  Respiratory:  Positive for cough. Negative for shortness of breath and wheezing.   Cardiovascular:  Negative for chest pain and leg swelling.  Musculoskeletal:  Negative for back pain and gait problem.  Skin:  Negative for rash.  All other systems reviewed and are negative.   Per HPI unless specifically indicated above   Allergies as of 06/02/2023   No Known Allergies      Medication List        Accurate as of June 02, 2023 10:22 AM. If you have any questions, ask your nurse or doctor.          STOP taking these medications    econazole nitrate 1 % cream Stopped by: Elige Radon Jessup Ogas   fluconazole 100 MG tablet Commonly known as: Diflucan Stopped by: Elige Radon Davidlee Jeanbaptiste       TAKE these medications    acetaminophen 500 MG tablet Commonly known as: TYLENOL Take 1 tablet (500 mg total) by mouth every 6 (six) hours as needed.   BEE POLLEN PO Take 1-2 capsules by mouth See admin instructions. Take 2 capsule by mouth in the morning and 1 capsule in the evening   clopidogrel 75 MG tablet Commonly known as: PLAVIX Take 1 tablet (75 mg total) by mouth daily.   CoQ10 100 MG Caps Take 100 mg by mouth daily.   ezetimibe-simvastatin 10-80 MG tablet Commonly known as: VYTORIN Take 0.5 tablets by mouth daily.   finasteride  5 MG tablet Commonly known as: PROSCAR Take 1 tablet (5 mg total) by mouth daily.   Fish Oil 1000 MG Caps Take 1,000 mg by mouth every evening.   fluticasone 50 MCG/ACT nasal spray Commonly known as: FLONASE Place 1 spray into both nostrils 2 (two) times daily as needed for allergies or rhinitis.   levothyroxine 50 MCG tablet Commonly known as: SYNTHROID Take 1 tablet (50 mcg total) by mouth daily before breakfast.   meloxicam 15 MG tablet Commonly known as: MOBIC Take 1 tablet (15 mg total) by mouth daily.   metoprolol tartrate 25 MG tablet Commonly known  as: LOPRESSOR Take 1 tablet (25 mg total) by mouth 2 (two) times daily.   MOVE FREE PO Take 1 tablet by mouth daily.   MULTIVITAMIN PO Take 1 tablet by mouth daily.   nitroGLYCERIN 0.4 MG SL tablet Commonly known as: NITROSTAT Place 1 tablet (0.4 mg total) under the tongue every 5 (five) minutes as needed for chest pain.   OVER THE COUNTER MEDICATION Take 3 capsules by mouth every morning. Prostagenix   sodium chloride 0.65 % Soln nasal spray Commonly known as: OCEAN Place 1 spray into both nostrils as needed for congestion.   tamsulosin 0.4 MG Caps capsule Commonly known as: FLOMAX Take 1 capsule (0.4 mg total) by mouth in the morning and at bedtime.   TURMERIC CURCUMIN PO Take 1,000 mg by mouth daily.   UNABLE TO FIND Med Name: Move Free supp OTC - one a day   valACYclovir 500 MG tablet Commonly known as: VALTREX Take 1 tablet (500 mg total) by mouth daily as needed (outbreaks).   Vitamin D 50 MCG (2000 UT) Caps Take 2,000 Units by mouth daily.   Zinc 50 MG Caps Take 50 mg by mouth daily.         Objective:   BP 135/74   Pulse (!) 58   Ht 5' 5.5" (1.664 m)   Wt 177 lb (80.3 kg)   SpO2 95%   BMI 29.01 kg/m   Wt Readings from Last 3 Encounters:  06/02/23 177 lb (80.3 kg)  11/28/22 174 lb (78.9 kg)  09/03/22 174 lb 6.4 oz (79.1 kg)    Physical Exam Vitals and nursing note reviewed.  Constitutional:      General: He is not in acute distress.    Appearance: He is well-developed. He is not diaphoretic.  HENT:     Nose: Congestion present. No rhinorrhea.  Eyes:     General: No scleral icterus.       Right eye: No discharge.     Conjunctiva/sclera: Conjunctivae normal.     Pupils: Pupils are equal, round, and reactive to light.  Neck:     Thyroid: No thyromegaly.  Cardiovascular:     Rate and Rhythm: Normal rate and regular rhythm.     Heart sounds: Normal heart sounds. No murmur heard. Pulmonary:     Effort: Pulmonary effort is normal. No  respiratory distress.     Breath sounds: Normal breath sounds. No wheezing.  Musculoskeletal:        General: Normal range of motion.     Cervical back: Neck supple.  Lymphadenopathy:     Cervical: No cervical adenopathy.  Skin:    General: Skin is warm and dry.     Findings: No rash.  Neurological:     Mental Status: He is alert and oriented to person, place, and time.     Coordination: Coordination normal.  Psychiatric:  Behavior: Behavior normal.       Assessment & Plan:   Problem List Items Addressed This Visit       Cardiovascular and Mediastinum   Essential hypertension, benign   Relevant Orders   CMP14+EGFR   Coronary atherosclerosis of native coronary artery   Relevant Orders   CBC with Differential/Platelet     Endocrine   Hypothyroidism   Relevant Orders   TSH     Other   Hypercholesteremia - Primary   Relevant Orders   CBC with Differential/Platelet   CMP14+EGFR   Lipid panel    Recommended that he use the Flonase and Mucinex and Benadryl to help with his congestion, if anything worsens and give [place] call back.  Will check blood work today.  Blood pressure and everything else seems to be doing well, no changes in medication. Follow up plan: Return in about 6 months (around 12/03/2023), or if symptoms worsen or fail to improve, for Physical exam and hypertension and cholesterol and thyroid.  Counseling provided for all of the vaccine components Orders Placed This Encounter  Procedures   CBC with Differential/Platelet   CMP14+EGFR   Lipid panel   TSH    Arville Care, MD [facility] 06/02/2023, 10:22 AM

## 2023-06-03 LAB — CBC WITH DIFFERENTIAL/PLATELET
Basophils Absolute: 0 10*3/uL (ref 0.0–0.2)
Basos: 0 %
EOS (ABSOLUTE): 0.2 10*3/uL (ref 0.0–0.4)
Eos: 2 %
Hematocrit: 47.3 % (ref 37.5–51.0)
Hemoglobin: 15.6 g/dL (ref 13.0–17.7)
Immature Grans (Abs): 0 10*3/uL (ref 0.0–0.1)
Immature Granulocytes: 0 %
Lymphocytes Absolute: 0.9 10*3/uL (ref 0.7–3.1)
Lymphs: 8 %
MCH: 31.2 pg (ref 26.6–33.0)
MCHC: 33 g/dL (ref 31.5–35.7)
MCV: 95 fL (ref 79–97)
Monocytes Absolute: 0.6 10*3/uL (ref 0.1–0.9)
Monocytes: 6 %
Neutrophils Absolute: 9.4 10*3/uL — ABNORMAL HIGH (ref 1.4–7.0)
Neutrophils: 84 %
Platelets: 161 10*3/uL (ref 150–450)
RBC: 5 x10E6/uL (ref 4.14–5.80)
RDW: 12.1 % (ref 11.6–15.4)
WBC: 11.2 10*3/uL — ABNORMAL HIGH (ref 3.4–10.8)

## 2023-06-03 LAB — CMP14+EGFR
ALT: 23 IU/L (ref 0–44)
AST: 21 IU/L (ref 0–40)
Albumin: 4.3 g/dL (ref 3.8–4.8)
Alkaline Phosphatase: 83 IU/L (ref 44–121)
BUN/Creatinine Ratio: 21 (ref 10–24)
BUN: 19 mg/dL (ref 8–27)
Bilirubin Total: 0.9 mg/dL (ref 0.0–1.2)
CO2: 25 mmol/L (ref 20–29)
Calcium: 9.2 mg/dL (ref 8.6–10.2)
Chloride: 103 mmol/L (ref 96–106)
Creatinine, Ser: 0.91 mg/dL (ref 0.76–1.27)
Globulin, Total: 2.2 g/dL (ref 1.5–4.5)
Glucose: 97 mg/dL (ref 70–99)
Potassium: 4.3 mmol/L (ref 3.5–5.2)
Sodium: 139 mmol/L (ref 134–144)
Total Protein: 6.5 g/dL (ref 6.0–8.5)
eGFR: 88 mL/min/{1.73_m2} (ref >59)

## 2023-06-03 LAB — LIPID PANEL
Chol/HDL Ratio: 4.4 ratio (ref 0.0–5.0)
Cholesterol, Total: 157 mg/dL (ref 100–199)
HDL: 36 mg/dL — ABNORMAL LOW (ref >39)
LDL Chol Calc (NIH): 85 mg/dL (ref 0–99)
Triglycerides: 211 mg/dL — ABNORMAL HIGH (ref 0–149)
VLDL Cholesterol Cal: 36 mg/dL (ref 5–40)

## 2023-06-03 LAB — TSH: TSH: 4.01 u[IU]/mL (ref 0.450–4.500)

## 2023-06-06 ENCOUNTER — Encounter: Payer: Self-pay | Admitting: Family Medicine

## 2023-07-04 ENCOUNTER — Encounter: Payer: Self-pay | Admitting: Family Medicine

## 2023-07-04 ENCOUNTER — Ambulatory Visit: Admitting: Family Medicine

## 2023-07-04 ENCOUNTER — Other Ambulatory Visit: Payer: Self-pay | Admitting: Family Medicine

## 2023-07-04 VITALS — BP 126/67 | HR 53 | Temp 98.2°F | Ht 65.5 in | Wt 171.0 lb

## 2023-07-04 DIAGNOSIS — J019 Acute sinusitis, unspecified: Secondary | ICD-10-CM

## 2023-07-04 MED ORDER — LEVOCETIRIZINE DIHYDROCHLORIDE 5 MG PO TABS: 5.0000 mg | ORAL_TABLET | Freq: Every evening | ORAL | 0 refills | Status: DC

## 2023-07-04 MED ORDER — AMOXICILLIN 875 MG PO TABS
875.0000 mg | ORAL_TABLET | Freq: Two times a day (BID) | ORAL | 0 refills | Status: AC
Start: 2023-07-04 — End: 2023-07-11

## 2023-07-04 MED ORDER — BENZONATATE 100 MG PO CAPS
100.0000 mg | ORAL_CAPSULE | Freq: Two times a day (BID) | ORAL | 0 refills | Status: DC | PRN
Start: 2023-07-04 — End: 2023-11-13

## 2023-07-04 NOTE — Progress Notes (Signed)
 Acute Office Visit  Subjective:     Patient ID: Corey Duran, male    DOB: 06-14-1950, 73 y.o.   MRN: [id number]  Chief Complaint  Patient presents with   Sinusitis    Sinusitis This is a new problem. Episode onset: 3-4 weeks. The problem has been gradually worsening since onset. There has been no fever. Associated symptoms include congestion, coughing, sinus pressure and sneezing. Pertinent negatives include no chills, ear pain, headaches or shortness of breath. (rhinorrhea) Treatments tried: OTC cough medictation. The treatment provided no relief.    Review of Systems  Constitutional:  Negative for chills.  HENT:  Positive for congestion, sinus pressure and sneezing. Negative for ear pain.   Respiratory:  Positive for cough. Negative for shortness of breath.   Neurological:  Negative for headaches.        Objective:    BP 126/67   Pulse (!) 53   Temp 98.2 F (36.8 C) (Temporal)   Ht 5' 5.5" (1.664 m)   Wt 171 lb (77.6 kg)   SpO2 94%   BMI 28.02 kg/m    Physical Exam Vitals and nursing note reviewed.  Constitutional:      General: He is not in acute distress.    Appearance: Normal appearance. He is not ill-appearing, toxic-appearing or diaphoretic.  HENT:     Right Ear: Tympanic membrane, ear canal and external ear normal.     Left Ear: Tympanic membrane, ear canal and external ear normal.     Nose: Congestion present.     Mouth/Throat:     Mouth: Mucous membranes are moist.     Pharynx: Posterior oropharyngeal erythema present. No pharyngeal swelling or oropharyngeal exudate.     Tonsils: No tonsillar exudate or tonsillar abscesses. 1+ on the right. 1+ on the left.  Eyes:     General:        Right eye: No discharge.        Left eye: No discharge.     Conjunctiva/sclera: Conjunctivae normal.  Cardiovascular:     Rate and Rhythm: Normal rate and regular rhythm.     Heart sounds: Normal heart sounds. No murmur heard. Pulmonary:     Effort: Pulmonary  effort is normal. No respiratory distress.     Breath sounds: Normal breath sounds. No wheezing.  Musculoskeletal:     Cervical back: Neck supple. No rigidity.  Lymphadenopathy:     Cervical: No cervical adenopathy.  Skin:    General: Skin is warm and dry.  Neurological:     General: No focal deficit present.     Mental Status: He is alert and oriented to person, place, and time.  Psychiatric:        Mood and Affect: Mood normal.        Behavior: Behavior normal.     No results found for any visits on 07/04/23.      Assessment & Plan:   Corey "Harvie Heck" was seen today for sinusitis.  Diagnoses and all orders for this visit:  Acute non-recurrent sinusitis, unspecified location Discussed symptomatic care and return precautions.  -     amoxicillin (AMOXIL) 875 MG tablet; Take 1 tablet (875 mg total) by mouth 2 (two) times daily for 7 days. -     benzonatate (TESSALON) 100 MG capsule; Take 1 capsule (100 mg total) by mouth 2 (two) times daily as needed for cough. -     levocetirizine (XYZAL) 5 MG tablet; Take 1 tablet (5 mg total) by  mouth every evening.  The patient indicates understanding of these issues and agrees with the plan.  Gabriel Earing, FNP

## 2023-08-16 ENCOUNTER — Other Ambulatory Visit: Payer: Self-pay | Admitting: Family Medicine

## 2023-09-02 ENCOUNTER — Other Ambulatory Visit: Payer: Self-pay | Admitting: Family Medicine

## 2023-09-02 DIAGNOSIS — E7800 Pure hypercholesterolemia, unspecified: Secondary | ICD-10-CM

## 2023-11-03 DIAGNOSIS — K080 Exfoliation of teeth due to systemic causes: Secondary | ICD-10-CM | POA: Diagnosis not present

## 2023-11-13 ENCOUNTER — Ambulatory Visit: Attending: Cardiology | Admitting: Cardiology

## 2023-11-13 ENCOUNTER — Encounter: Payer: Self-pay | Admitting: Cardiology

## 2023-11-13 VITALS — BP 144/80 | HR 52 | Resp 16 | Ht 65.0 in

## 2023-11-13 DIAGNOSIS — I2510 Atherosclerotic heart disease of native coronary artery without angina pectoris: Secondary | ICD-10-CM

## 2023-11-13 DIAGNOSIS — E782 Mixed hyperlipidemia: Secondary | ICD-10-CM

## 2023-11-13 DIAGNOSIS — I10 Essential (primary) hypertension: Secondary | ICD-10-CM | POA: Diagnosis not present

## 2023-11-13 MED ORDER — EZETIMIBE 10 MG PO TABS: 10.0000 mg | ORAL_TABLET | Freq: Every day | ORAL | 1 refills | Status: AC

## 2023-11-13 MED ORDER — ROSUVASTATIN CALCIUM 20 MG PO TABS: 20.0000 mg | ORAL_TABLET | Freq: Every day | ORAL | 1 refills | Status: AC

## 2023-11-13 MED ORDER — LOSARTAN POTASSIUM 25 MG PO TABS: 25.0000 mg | ORAL_TABLET | Freq: Every day | ORAL | 1 refills | Status: AC

## 2023-11-13 NOTE — Progress Notes (Signed)
 Cardiology Office Note:  .   Date:  11/13/2023  ID:  Corey Duran, DOB 08-10-1950, MRN [id number] PCP: Dettinger, Fonda LABOR, MD  [facility] Providers Cardiologist:  Gordy Bergamo, MD   History of Present Illness: .   Corey Duran is a 73 y.o. with CAD, HTN, HLD and OSA not on CPAP.  She has had stenting to his LAD in 2008, remains asymptomatic.  Last treadmill exercise stress test on 04/02/2018 revealed good exercise response and no ST-T wave changes of ischemia.  Last major surgical operation was in 05/03/2020 with a right total hip arthroplasty without any periprocedural complications.  He now presents for annual visit. Remains asymptomatic  Cardiac Studies relevent.        Discussed the use of AI scribe software for clinical note transcription with the patient, who gave verbal consent to proceed.  History of Present Illness Corey Duran is a 73 year old male with hypertension and coronary artery disease who presents for follow-up of his cardiovascular health.  Home blood pressure readings are typically in the low 130s, with today's reading higher than usual due to not taking his medication this morning. No chest pain or shortness of breath is reported.  Coronary artery disease history includes a stent placed in 2008, with no cardiac issues since. A stress test in 2020 was completed successfully. He carries nitroglycerin as a precaution but has not used it in years.  Cholesterol levels remain in the 80s, higher than desired. He is on simvastatin and azithromycin, but LDL levels are not adequately controlled.   Labs   Lab Results  Component Value Date   CHOL 157 06/02/2023   HDL 36 (L) 06/02/2023   LDLCALC 85 06/02/2023   LDLDIRECT 70.4 03/29/2014   TRIG 211 (H) 06/02/2023   CHOLHDL 4.4 06/02/2023   No results found for: LIPOA  Recent Labs    11/28/22 1351 06/02/23 1030  NA 141 139  K 3.7 4.3  CL 106 103  CO2 25 25  GLUCOSE 91 97  BUN 23 19   CREATININE 0.96 0.91  CALCIUM 9.2 9.2    Lab Results  Component Value Date   ALT 23 06/02/2023   AST 21 06/02/2023   ALKPHOS 83 06/02/2023   BILITOT 0.9 06/02/2023      Latest Ref Rng & Units 06/02/2023   10:30 AM 11/28/2022    1:51 PM 03/20/2022    9:13 AM  CBC  WBC 3.4 - 10.8 x10E3/uL 11.2  5.7  6.9   Hemoglobin 13.0 - 17.7 g/dL 84.3  84.5  84.4   Hematocrit 37.5 - 51.0 % 47.3  45.7  46.0   Platelets 150 - 450 x10E3/uL 161  173  203    Lab Results  Component Value Date   HGBA1C 5.5 11/16/2018    Lab Results  Component Value Date   TSH 4.010 06/02/2023     ROS  Review of Systems  Cardiovascular:  Negative for chest pain, dyspnea on exertion and leg swelling.   Physical Exam:   VS:  BP (!) 144/80 (BP Location: Left Arm, Patient Position: Sitting, Cuff Size: Large)   Pulse (!) 52   Resp 16   Ht 5' 5 (1.651 m)   SpO2 97%   BMI 28.46 kg/m    Wt Readings from Last 3 Encounters:  07/04/23 171 lb (77.6 kg)  06/02/23 177 lb (80.3 kg)  11/28/22 174 lb (78.9 kg)    BP Readings from Last  3 Encounters:  11/13/23 (!) 144/80  07/04/23 126/67  06/02/23 135/74   Physical Exam Constitutional:      Appearance: He is obese.  Neck:     Vascular: No carotid bruit or JVD.  Cardiovascular:     Rate and Rhythm: Normal rate and regular rhythm.     Pulses: Intact distal pulses.     Heart sounds: Normal heart sounds. No murmur heard.    No gallop.  Pulmonary:     Effort: Pulmonary effort is normal.     Breath sounds: Normal breath sounds.  Abdominal:     General: Bowel sounds are normal.     Palpations: Abdomen is soft.  Musculoskeletal:     Right lower leg: No edema.     Left lower leg: No edema.    EKG:    EKG Interpretation Date/Time:  Thursday November 13 2023 10:50:57 EDT Ventricular Rate:  51 PR Interval:  154 QRS Duration:  82 QT Interval:  440 QTC Calculation: 405 R Axis:   -2  Text Interpretation: EKG 11/13/2023: Normal sinus rhythm at rate of 51 bpm,  normal EKG.  No change from 06/14/2006. Confirmed by Bertie Simien, Jagadeesh ([id number]) on 11/13/2023 10:56:45 AM    ASSESSMENT AND PLAN: .      ICD-10-CM   1. Atherosclerosis of native coronary artery of native heart without angina pectoris  I25.10 EKG 12-Lead    2. Essential hypertension, benign  I10 losartan (COZAAR) 25 MG tablet    3. Mixed hypercholesterolemia and hypertriglyceridemia  E78.2 rosuvastatin (CRESTOR) 20 MG tablet    ezetimibe (ZETIA) 10 MG tablet     Assessment & Plan Coronary artery disease, post-stent placement Coronary artery disease post-stent placement in 2008 with no recent complications. No chest pain or shortness of breath. EKG is normal. Stress test in 2020 was excellent. No need for nitroglycerin as it has not been used in years. - Discontinue nitroglycerin. - Transition care to Dr. Fonda Dettinger for ongoing management.  Hyperlipidemia, not at LDL goal Hyperlipidemia with LDL at 85 mg/dL, not at goal. Current regimen of simvastatin 80 mg and Zetia (Vytorin) is inadequate. Plan to achieve LDL less than 70 mg/dL with new regimen. - Discontinue simvastatin and Zetia (Vytorin). - Initiate Crestor 20 mg daily. - Initiate Zetia 10 mg daily. - Recheck cholesterol levels in a couple of months with CP and may need 40 mg Crestor  Hypertension Hypertension with home readings in the 130s and current blood pressure at 144/80 mmHg. Not on ACE inhibitor or ARB despite coronary artery disease and stent placement. Plan to initiate ARB for better control and organ protection. - Initiate losartan 25 mg in the evening. - Reassess blood pressure control in a couple of months. - May need Losartan Hct 12.5 mg to control systolic hypertension, can be done by his PCP  Overweight (BMI 28) Overweight with BMI of 28. Encouraged weight loss to improve overall health and assist in blood pressure and cholesterol management. - Encourage weight loss of 10-15 pounds.  Follow up:  PRN  Signed,  Gordy Bergamo, MD, [facility] 11/13/2023, 9:06 PM [facility] [address] [place], [place] [address] Phone: [phone]. Fax:  [phone]

## 2023-11-13 NOTE — Patient Instructions (Signed)
 Medication Instructions:  Your physician has recommended you make the following change in your medication: Stop Vytorin Start Rosuvastatin 20 mg by mouth daily  Start Ezetimibe 10 mg by mouth daily Start Losartan 25 mg by mouth daily in the evening  *If you need a refill on your cardiac medications before your next appointment, please call your pharmacy*  Lab Work: none If you have labs (blood work) drawn today and your tests are completely normal, you will receive your results only by: MyChart Message (if you have MyChart) OR A paper copy in the mail If you have any lab test that is abnormal or we need to change your treatment, we will call you to review the results.  Testing/Procedures: none  Follow-Up: At [facility], you and your health needs are our priority.  As part of our continuing mission to provide you with exceptional heart care, our providers are all part of one team.  This team includes your primary Cardiologist (physician) and Advanced Practice Providers or APPs (Physician Assistants and Nurse Practitioners) who all work together to provide you with the care you need, when you need it.  Your next appointment:   As needed  Provider:   Gordy Bergamo, MD    We recommend signing up for the patient portal called MyChart.  Sign up information is provided on this After Visit Summary.  MyChart is used to connect with patients for Virtual Visits (Telemedicine).  Patients are able to view lab/test results, encounter notes, upcoming appointments, etc.  Non-urgent messages can be sent to your provider as well.   To learn more about what you can do with MyChart, go to [email or web address].   Other Instructions

## 2023-11-24 DIAGNOSIS — N400 Enlarged prostate without lower urinary tract symptoms: Secondary | ICD-10-CM | POA: Diagnosis not present

## 2023-11-25 ENCOUNTER — Other Ambulatory Visit: Payer: Self-pay | Admitting: Family Medicine

## 2023-11-25 DIAGNOSIS — N401 Enlarged prostate with lower urinary tract symptoms: Secondary | ICD-10-CM

## 2023-12-03 ENCOUNTER — Ambulatory Visit: Admitting: Family Medicine

## 2023-12-03 ENCOUNTER — Encounter: Payer: Self-pay | Admitting: Family Medicine

## 2023-12-03 VITALS — BP 122/77 | HR 47 | Ht 65.0 in | Wt 175.0 lb

## 2023-12-03 DIAGNOSIS — R351 Nocturia: Secondary | ICD-10-CM

## 2023-12-03 DIAGNOSIS — I2510 Atherosclerotic heart disease of native coronary artery without angina pectoris: Secondary | ICD-10-CM

## 2023-12-03 DIAGNOSIS — N401 Enlarged prostate with lower urinary tract symptoms: Secondary | ICD-10-CM | POA: Diagnosis not present

## 2023-12-03 DIAGNOSIS — E7800 Pure hypercholesterolemia, unspecified: Secondary | ICD-10-CM | POA: Diagnosis not present

## 2023-12-03 DIAGNOSIS — E039 Hypothyroidism, unspecified: Secondary | ICD-10-CM

## 2023-12-03 DIAGNOSIS — Z0001 Encounter for general adult medical examination with abnormal findings: Secondary | ICD-10-CM

## 2023-12-03 DIAGNOSIS — I10 Essential (primary) hypertension: Secondary | ICD-10-CM | POA: Diagnosis not present

## 2023-12-03 DIAGNOSIS — M7701 Medial epicondylitis, right elbow: Secondary | ICD-10-CM

## 2023-12-03 DIAGNOSIS — Z0000 Encounter for general adult medical examination without abnormal findings: Secondary | ICD-10-CM | POA: Diagnosis not present

## 2023-12-03 DIAGNOSIS — M65322 Trigger finger, left index finger: Secondary | ICD-10-CM

## 2023-12-03 LAB — LIPID PANEL

## 2023-12-03 NOTE — Progress Notes (Signed)
 BP 122/77   Pulse (!) 47   Ht 5' 5 (1.651 m)   Wt 175 lb (79.4 kg)   SpO2 95%   BMI 29.12 kg/m    Subjective:   Patient ID: Corey Duran, male    DOB: 27-Jan-1951, 73 y.o.   MRN: [id number]  HPI: Corey Duran is a 73 y.o. male presenting on 12/03/2023 for Medical Management of Chronic Issues and Hypothyroidism   Discussed the use of AI scribe software for clinical note transcription with the patient, who gave verbal consent to proceed.  History of Present Illness   Corey Duran is a 73 year old male who presents for a physical exam and checkup.  He experiences chronic fatigue and reports frequent nighttime urination and untreated sleep apnea. This fatigue impacts his ability to play golf. He is currently on finasteride  and Flomax  for benign prostatic hyperplasia (BPH).  He has a history of sleep apnea and was prescribed a CPAP machine, which he stopped using due to frequent awakenings at night.  He experiences difficulty swallowing, feeling like food gets stuck in his throat, sometimes even with drinking. He occasionally experiences heartburn and is taking omeprazole every other day.  He mentions a sore elbow, particularly when performing certain movements, and notes that he plays golf. He also reports a trigger finger in his left index finger, which bothers him daily, especially upon waking. He is currently massaging and icing it.  He is on multiple medications, including those for his heart and blood pressure, and expresses concern about the growing list of medications. He takes his medications half in the morning and half at night, with specific instructions for his thyroid  medication.          Relevant past medical, surgical, family and social history reviewed and updated as indicated. Interim medical history since our last visit reviewed. Allergies and medications reviewed and updated.  Review of Systems  Constitutional:  Positive for fatigue. Negative for  chills and fever.  HENT:  Negative for ear pain and tinnitus.   Eyes:  Negative for pain and discharge.  Respiratory:  Negative for cough, shortness of breath and wheezing.   Cardiovascular:  Negative for chest pain, palpitations and leg swelling.  Gastrointestinal:  Negative for abdominal pain, blood in stool, constipation and diarrhea.  Genitourinary:  Positive for frequency. Negative for decreased urine volume, difficulty urinating, dysuria and hematuria.  Musculoskeletal:  Positive for arthralgias and myalgias. Negative for back pain and gait problem.  Skin:  Negative for rash.  Neurological:  Negative for dizziness, weakness and headaches.  Psychiatric/Behavioral:  Negative for suicidal ideas.   All other systems reviewed and are negative.   Per HPI unless specifically indicated above   Allergies as of 12/03/2023   No Known Allergies      Medication List        Accurate as of December 03, 2023 10:38 AM. If you have any questions, ask your nurse or doctor.          STOP taking these medications    levocetirizine 5 MG tablet Commonly known as: XYZAL  Stopped by: Fonda LABOR Rayleen Wyrick       TAKE these medications    acetaminophen  500 MG tablet Commonly known as: TYLENOL  Take 1 tablet (500 mg total) by mouth every 6 (six) hours as needed.   BEE POLLEN PO Take 1-2 capsules by mouth See admin instructions. Take 2 capsule by mouth in the morning and 1 capsule in the  evening   clopidogrel  75 MG tablet Commonly known as: PLAVIX  Take 1 tablet (75 mg total) by mouth daily.   CoQ10 100 MG Caps Take 100 mg by mouth daily.   ezetimibe  10 MG tablet Commonly known as: ZETIA  Take 1 tablet (10 mg total) by mouth daily.   finasteride  5 MG tablet Commonly known as: PROSCAR  Take 1 tablet (5 mg total) by mouth daily.   Fish Oil 1000 MG Caps Take 1,000 mg by mouth every evening.   fluticasone  50 MCG/ACT nasal spray Commonly known as: FLONASE  Place 1 spray into both  nostrils 2 (two) times daily as needed for allergies or rhinitis.   levothyroxine  50 MCG tablet Commonly known as: SYNTHROID  Take 1 tablet (50 mcg total) by mouth daily before breakfast.   losartan  25 MG tablet Commonly known as: COZAAR  Take 1 tablet (25 mg total) by mouth daily.   meloxicam  15 MG tablet Commonly known as: MOBIC  Take 1 tablet (15 mg total) by mouth daily.   metoprolol  tartrate 25 MG tablet Commonly known as: LOPRESSOR  Take 1 tablet (25 mg total) by mouth 2 (two) times daily.   MOVE FREE PO Take 1 tablet by mouth daily.   MULTIVITAMIN PO Take 1 tablet by mouth daily.   OVER THE COUNTER MEDICATION Take 3 capsules by mouth every morning. Prostagenix   rosuvastatin  20 MG tablet Commonly known as: CRESTOR  Take 1 tablet (20 mg total) by mouth daily.   sodium chloride  0.65 % Soln nasal spray Commonly known as: OCEAN Place 1 spray into both nostrils as needed for congestion.   tamsulosin  0.4 MG Caps capsule Commonly known as: FLOMAX  TAKE 1 CAPSULE(0.4 MG) BY MOUTH IN THE MORNING AND AT BEDTIME   TURMERIC CURCUMIN PO Take 1,000 mg by mouth daily.   valACYclovir  500 MG tablet Commonly known as: VALTREX  TAKE 1 TABLET(500 MG) BY MOUTH DAILY AS NEEDED FOR OUTBREAK   Zinc 50 MG Caps Take 50 mg by mouth daily.         Objective:   BP 122/77   Pulse (!) 47   Ht 5' 5 (1.651 m)   Wt 175 lb (79.4 kg)   SpO2 95%   BMI 29.12 kg/m   Wt Readings from Last 3 Encounters:  12/03/23 175 lb (79.4 kg)  07/04/23 171 lb (77.6 kg)  06/02/23 177 lb (80.3 kg)    Physical Exam Physical Exam   VITALS: BP- 122/77 HEENT: Ears normal, no cerumen impaction. Throat normal, no masses or swelling. Vision grossly intact. CHEST: Lungs clear to auscultation bilaterally. CARDIOVASCULAR: Heart regular rate and rhythm, no murmurs. ABDOMEN: Abdomen soft, non-tender, no masses. EXTREMITIES: No edema in lower extremities. Pulses palpable in both feet.         Assessment  & Plan:   Problem List Items Addressed This Visit       Cardiovascular and Mediastinum   Essential hypertension, benign   Relevant Orders   CBC with Differential/Platelet   CMP14+EGFR   Coronary atherosclerosis of native coronary artery     Endocrine   Hypothyroidism   Relevant Orders   TSH     Other   Hypercholesteremia   Relevant Orders   CBC with Differential/Platelet   CMP14+EGFR   Lipid panel   Benign prostatic hyperplasia with nocturia   Relevant Orders   PSA, total and free   Other Visit Diagnoses       Physical exam    -  Primary   Relevant Orders   CBC with  Differential/Platelet   CMP14+EGFR   Lipid panel   PSA, total and free   TSH     Golfer's elbow, right         Trigger index finger of left hand             Adult Wellness Visit Routine wellness examination. Blood pressure well-controlled. Thyroid  function to be rechecked. - Perform routine physical examination. - Recheck thyroid  function.  Benign prostatic hyperplasia with lower urinary tract symptoms Nocturia 5-6 times per night affecting sleep. Recent urology evaluation showed 17% bladder emptying. Surgical options discussed. - Discuss surgical options with urologist, including laser surgery and embolization. - Consider addressing nocturia to improve energy levels.  Obstructive sleep apnea, not currently treated Previously prescribed CPAP but discontinued. Untreated sleep apnea may impact energy levels. Discussed alternative treatments. - Consult with a sleep specialist regarding alternative treatments for sleep apnea, such as the Inspire device.  Gastroesophageal reflux disease with dysphagia Dysphagia likely due to acid reflux causing esophageal inflammation. Potential for esophageal stricture if symptoms persist. - Increase omeprazole to daily for two weeks. - If dysphagia persists, consult gastroenterologist for possible esophageal stricture evaluation.  Hypothyroidism Managed with  levothyroxine . Discussed proper timing of medication intake. - Continue levothyroxine . - Take levothyroxine  30 minutes before other medications and food in the morning.  Atherosclerotic heart disease of native coronary artery without angina No current angina symptoms. Blood pressure well-controlled.  Essential hypertension Blood pressure well-controlled at 122/77 mmHg.  Pure hypercholesterolemia Cholesterol management discussed. - Continue cholesterol medications, preferably in the evening.  Left index finger trigger finger Experiencing daily symptoms with occasional locking. Discussed use of anti-inflammatory cream and potential for steroid injection. - Use Voltaren gel on left index finger. - Ice left index finger daily. - Consider steroid injection if symptoms persist.  Medial epicondylitis (golfer's elbow), left elbow Experiencing soreness in left elbow, likely due to golfing. Advised on anti-inflammatory measures and activity modification. - Ice left elbow after golfing. - Use anti-inflammatory measures. - Avoid repetitive pushing or pulling activities. - Consider using a sports band for support.      Follow up plan: Return in about 6 months (around 06/01/2024), or if symptoms worsen or fail to improve, for Recheck hyperlipidemia.  Counseling provided for all of the vaccine components Orders Placed This Encounter  Procedures   CBC with Differential/Platelet   CMP14+EGFR   Lipid panel   PSA, total and free   TSH    Fonda Levins, MD [facility] 12/03/2023, 10:38 AM

## 2023-12-04 LAB — LIPID PANEL
Cholesterol, Total: 137 mg/dL (ref 100–199)
HDL: 43 mg/dL (ref >39)
LDL CALC COMMENT:: 3.2 ratio (ref 0.0–5.0)
LDL Chol Calc (NIH): 77 mg/dL (ref 0–99)
Triglycerides: 86 mg/dL (ref 0–149)
VLDL Cholesterol Cal: 17 mg/dL (ref 5–40)

## 2023-12-04 LAB — CBC WITH DIFFERENTIAL/PLATELET
Basophils Absolute: 0 x10E3/uL (ref 0.0–0.2)
Basos: 0 %
EOS (ABSOLUTE): 0.1 x10E3/uL (ref 0.0–0.4)
Eos: 2 %
Hematocrit: 44.7 % (ref 37.5–51.0)
Hemoglobin: 15.4 g/dL (ref 13.0–17.7)
Immature Grans (Abs): 0 x10E3/uL (ref 0.0–0.1)
Immature Granulocytes: 0 %
Lymphocytes Absolute: 1.3 x10E3/uL (ref 0.7–3.1)
Lymphs: 22 %
MCH: 32.5 pg (ref 26.6–33.0)
MCHC: 34.5 g/dL (ref 31.5–35.7)
MCV: 94 fL (ref 79–97)
Monocytes Absolute: 0.4 x10E3/uL (ref 0.1–0.9)
Monocytes: 7 %
Neutrophils Absolute: 4 x10E3/uL (ref 1.4–7.0)
Neutrophils: 69 %
Platelets: 158 x10E3/uL (ref 150–450)
RBC: 4.74 x10E6/uL (ref 4.14–5.80)
RDW: 12 % (ref 11.6–15.4)
WBC: 5.9 x10E3/uL (ref 3.4–10.8)

## 2023-12-04 LAB — CMP14+EGFR
ALT: 21 IU/L (ref 0–44)
AST: 22 IU/L (ref 0–40)
Albumin: 4.3 g/dL (ref 3.8–4.8)
Alkaline Phosphatase: 73 IU/L (ref 44–121)
BUN/Creatinine Ratio: 29 — AB (ref 10–24)
BUN: 27 mg/dL (ref 8–27)
Bilirubin Total: 0.6 mg/dL (ref 0.0–1.2)
CO2: 23 mmol/L (ref 20–29)
Calcium: 9.4 mg/dL (ref 8.6–10.2)
Chloride: 104 mmol/L (ref 96–106)
Creatinine, Ser: 0.93 mg/dL (ref 0.76–1.27)
Globulin, Total: 2 g/dL (ref 1.5–4.5)
Glucose: 103 mg/dL — ABNORMAL HIGH (ref 70–99)
Potassium: 4.4 mmol/L (ref 3.5–5.2)
Sodium: 140 mmol/L (ref 134–144)
Total Protein: 6.3 g/dL (ref 6.0–8.5)
eGFR: 86 mL/min/1.73 (ref >59)

## 2023-12-04 LAB — PSA, TOTAL AND FREE
PSA, Free Pct: 25.6
PSA, Free: 0.87 ng/mL
Prostate Specific Ag, Serum: 3.4 ng/mL (ref 0.0–4.0)

## 2023-12-04 LAB — TSH: TSH: 2.32 u[IU]/mL (ref 0.450–4.500)

## 2023-12-11 ENCOUNTER — Ambulatory Visit: Payer: Self-pay | Admitting: Family Medicine

## 2023-12-12 NOTE — Progress Notes (Signed)
 Reviewed results with patient and patient voiced understanding. Also, he wanted to let Dr Dettinger know that he has been taking the Omeprazole daily for the last 2 weeks as directed instead of taking PRN, and says taking the medicine daily has really helped him so he will continue to take daily.

## 2023-12-16 ENCOUNTER — Telehealth: Payer: Self-pay | Admitting: Family Medicine

## 2023-12-16 NOTE — Telephone Encounter (Signed)
 Called and left message to call back.

## 2023-12-16 NOTE — Telephone Encounter (Signed)
 Copied from CRM [id number]. Topic: Clinical - Lab/Test Results >> Dec 16, 2023  9:28 AM Tonda B wrote: Reason for CRM: patient has question about what test he needs and do not need to get please call patient back [phone] (M)

## 2023-12-18 NOTE — Telephone Encounter (Signed)
 Pt questioned if he was up to date on his vaccines. Advised that he is up to date on pneumonia, tdap and shingles.

## 2024-01-21 ENCOUNTER — Other Ambulatory Visit: Payer: Self-pay | Admitting: Family Medicine

## 2024-01-21 DIAGNOSIS — E7800 Pure hypercholesterolemia, unspecified: Secondary | ICD-10-CM

## 2024-01-27 ENCOUNTER — Other Ambulatory Visit: Payer: Self-pay | Admitting: Family Medicine

## 2024-01-27 DIAGNOSIS — E039 Hypothyroidism, unspecified: Secondary | ICD-10-CM

## 2024-01-27 DIAGNOSIS — N401 Enlarged prostate with lower urinary tract symptoms: Secondary | ICD-10-CM

## 2024-01-27 DIAGNOSIS — M1611 Unilateral primary osteoarthritis, right hip: Secondary | ICD-10-CM

## 2024-01-27 DIAGNOSIS — I10 Essential (primary) hypertension: Secondary | ICD-10-CM

## 2024-03-16 DIAGNOSIS — L570 Actinic keratosis: Secondary | ICD-10-CM | POA: Diagnosis not present

## 2024-03-16 DIAGNOSIS — Z85828 Personal history of other malignant neoplasm of skin: Secondary | ICD-10-CM | POA: Diagnosis not present

## 2024-03-16 DIAGNOSIS — D225 Melanocytic nevi of trunk: Secondary | ICD-10-CM | POA: Diagnosis not present

## 2024-03-16 DIAGNOSIS — L821 Other seborrheic keratosis: Secondary | ICD-10-CM | POA: Diagnosis not present

## 2024-03-16 DIAGNOSIS — C4441 Basal cell carcinoma of skin of scalp and neck: Secondary | ICD-10-CM | POA: Diagnosis not present

## 2024-05-06 ENCOUNTER — Other Ambulatory Visit (HOSPITAL_COMMUNITY): Payer: Self-pay

## 2024-06-02 ENCOUNTER — Ambulatory Visit: Payer: Self-pay | Admitting: Family Medicine
